# Patient Record
Sex: Female | Born: 1975
Health system: Southern US, Community
[De-identification: ages and names within clinical notes are randomized; demographics above are authoritative.]

## PROBLEM LIST (undated history)

## (undated) DIAGNOSIS — I10 Essential (primary) hypertension: Secondary | ICD-10-CM

## (undated) DIAGNOSIS — I5020 Unspecified systolic (congestive) heart failure: Secondary | ICD-10-CM

## (undated) DIAGNOSIS — B1920 Unspecified viral hepatitis C without hepatic coma: Secondary | ICD-10-CM

## (undated) HISTORY — DX: Unspecified systolic (congestive) heart failure: I50.20

## (undated) HISTORY — PX: LIVER BIOPSY: SHX301

---

## 1997-06-23 ENCOUNTER — Ambulatory Visit (HOSPITAL_COMMUNITY): Admission: RE | Admit: 1997-06-23 | Discharge: 1997-06-23 | Payer: Self-pay | Admitting: *Deleted

## 1997-08-12 ENCOUNTER — Ambulatory Visit (HOSPITAL_COMMUNITY): Admission: RE | Admit: 1997-08-12 | Discharge: 1997-08-12 | Payer: Self-pay | Admitting: *Deleted

## 1997-09-15 ENCOUNTER — Inpatient Hospital Stay (HOSPITAL_COMMUNITY): Admission: AD | Admit: 1997-09-15 | Discharge: 1997-09-15 | Payer: Self-pay | Admitting: *Deleted

## 1998-01-01 ENCOUNTER — Inpatient Hospital Stay (HOSPITAL_COMMUNITY): Admission: AD | Admit: 1998-01-01 | Discharge: 1998-01-05 | Payer: Self-pay | Admitting: *Deleted

## 1998-07-10 ENCOUNTER — Inpatient Hospital Stay (HOSPITAL_COMMUNITY): Admission: AD | Admit: 1998-07-10 | Discharge: 1998-07-10 | Payer: Self-pay | Admitting: *Deleted

## 1998-11-13 ENCOUNTER — Inpatient Hospital Stay (HOSPITAL_COMMUNITY): Admission: AD | Admit: 1998-11-13 | Discharge: 1998-11-13 | Payer: Self-pay | Admitting: *Deleted

## 1998-12-28 ENCOUNTER — Emergency Department (HOSPITAL_COMMUNITY): Admission: EM | Admit: 1998-12-28 | Discharge: 1998-12-28 | Payer: Self-pay | Admitting: Emergency Medicine

## 2000-01-05 ENCOUNTER — Emergency Department (HOSPITAL_COMMUNITY): Admission: EM | Admit: 2000-01-05 | Discharge: 2000-01-05 | Payer: Self-pay | Admitting: Emergency Medicine

## 2000-06-10 ENCOUNTER — Emergency Department (HOSPITAL_COMMUNITY): Admission: EM | Admit: 2000-06-10 | Discharge: 2000-06-10 | Payer: Self-pay | Admitting: Emergency Medicine

## 2000-08-25 ENCOUNTER — Encounter: Payer: Self-pay | Admitting: *Deleted

## 2000-08-25 ENCOUNTER — Ambulatory Visit (HOSPITAL_COMMUNITY): Admission: RE | Admit: 2000-08-25 | Discharge: 2000-08-25 | Payer: Self-pay | Admitting: *Deleted

## 2000-12-11 ENCOUNTER — Inpatient Hospital Stay (HOSPITAL_COMMUNITY): Admission: AD | Admit: 2000-12-11 | Discharge: 2000-12-11 | Payer: Self-pay | Admitting: Obstetrics and Gynecology

## 2000-12-22 ENCOUNTER — Encounter (HOSPITAL_COMMUNITY): Admission: RE | Admit: 2000-12-22 | Discharge: 2000-12-30 | Payer: Self-pay | Admitting: Obstetrics and Gynecology

## 2001-01-01 ENCOUNTER — Encounter (INDEPENDENT_AMBULATORY_CARE_PROVIDER_SITE_OTHER): Payer: Self-pay | Admitting: Specialist

## 2001-01-01 ENCOUNTER — Inpatient Hospital Stay (HOSPITAL_COMMUNITY): Admission: AD | Admit: 2001-01-01 | Discharge: 2001-01-04 | Payer: Self-pay | Admitting: Obstetrics and Gynecology

## 2001-02-26 ENCOUNTER — Other Ambulatory Visit: Admission: RE | Admit: 2001-02-26 | Discharge: 2001-02-26 | Payer: Self-pay | Admitting: Obstetrics and Gynecology

## 2001-07-10 ENCOUNTER — Emergency Department (HOSPITAL_COMMUNITY): Admission: EM | Admit: 2001-07-10 | Discharge: 2001-07-10 | Payer: Self-pay | Admitting: Emergency Medicine

## 2002-02-11 ENCOUNTER — Emergency Department (HOSPITAL_COMMUNITY): Admission: EM | Admit: 2002-02-11 | Discharge: 2002-02-11 | Payer: Self-pay

## 2002-08-05 ENCOUNTER — Other Ambulatory Visit: Admission: RE | Admit: 2002-08-05 | Discharge: 2002-08-05 | Payer: Self-pay | Admitting: Obstetrics and Gynecology

## 2002-10-24 ENCOUNTER — Inpatient Hospital Stay (HOSPITAL_COMMUNITY): Admission: AD | Admit: 2002-10-24 | Discharge: 2002-10-24 | Payer: Self-pay | Admitting: Obstetrics and Gynecology

## 2003-01-10 ENCOUNTER — Encounter: Payer: Self-pay | Admitting: Obstetrics and Gynecology

## 2003-01-10 ENCOUNTER — Inpatient Hospital Stay (HOSPITAL_COMMUNITY): Admission: AD | Admit: 2003-01-10 | Discharge: 2003-01-12 | Payer: Self-pay | Admitting: Obstetrics and Gynecology

## 2003-01-26 ENCOUNTER — Encounter: Admission: RE | Admit: 2003-01-26 | Discharge: 2003-01-26 | Payer: Self-pay | Admitting: Obstetrics and Gynecology

## 2003-02-11 ENCOUNTER — Encounter: Payer: Self-pay | Admitting: Obstetrics and Gynecology

## 2003-02-11 ENCOUNTER — Inpatient Hospital Stay (HOSPITAL_COMMUNITY): Admission: AD | Admit: 2003-02-11 | Discharge: 2003-02-13 | Payer: Self-pay | Admitting: Obstetrics and Gynecology

## 2003-02-13 ENCOUNTER — Encounter: Payer: Self-pay | Admitting: Obstetrics and Gynecology

## 2003-02-17 ENCOUNTER — Inpatient Hospital Stay (HOSPITAL_COMMUNITY): Admission: AD | Admit: 2003-02-17 | Discharge: 2003-02-22 | Payer: Self-pay | Admitting: Obstetrics and Gynecology

## 2004-06-01 ENCOUNTER — Ambulatory Visit: Payer: Self-pay | Admitting: Sports Medicine

## 2004-06-04 ENCOUNTER — Ambulatory Visit: Payer: Self-pay | Admitting: Family Medicine

## 2004-06-07 ENCOUNTER — Ambulatory Visit: Payer: Self-pay | Admitting: Family Medicine

## 2004-06-18 ENCOUNTER — Ambulatory Visit: Payer: Self-pay | Admitting: Family Medicine

## 2005-01-23 ENCOUNTER — Ambulatory Visit: Payer: Self-pay | Admitting: Family Medicine

## 2005-02-21 ENCOUNTER — Emergency Department (HOSPITAL_COMMUNITY): Admission: EM | Admit: 2005-02-21 | Discharge: 2005-02-21 | Payer: Self-pay | Admitting: Emergency Medicine

## 2005-02-22 ENCOUNTER — Ambulatory Visit: Payer: Self-pay | Admitting: Family Medicine

## 2005-02-28 ENCOUNTER — Ambulatory Visit (HOSPITAL_COMMUNITY): Admission: RE | Admit: 2005-02-28 | Discharge: 2005-02-28 | Payer: Self-pay | Admitting: Family Medicine

## 2005-02-28 ENCOUNTER — Ambulatory Visit: Payer: Self-pay | Admitting: Family Medicine

## 2005-03-14 ENCOUNTER — Ambulatory Visit: Payer: Self-pay | Admitting: Sports Medicine

## 2005-03-20 ENCOUNTER — Emergency Department (HOSPITAL_COMMUNITY): Admission: EM | Admit: 2005-03-20 | Discharge: 2005-03-20 | Payer: Self-pay | Admitting: Emergency Medicine

## 2005-03-25 ENCOUNTER — Ambulatory Visit: Payer: Self-pay | Admitting: Family Medicine

## 2005-04-09 ENCOUNTER — Ambulatory Visit: Payer: Self-pay | Admitting: Family Medicine

## 2005-05-22 ENCOUNTER — Ambulatory Visit: Payer: Self-pay | Admitting: Family Medicine

## 2005-05-24 ENCOUNTER — Ambulatory Visit: Payer: Self-pay | Admitting: Sports Medicine

## 2006-03-03 ENCOUNTER — Emergency Department (HOSPITAL_COMMUNITY): Admission: EM | Admit: 2006-03-03 | Discharge: 2006-03-03 | Payer: Self-pay | Admitting: Emergency Medicine

## 2006-06-07 ENCOUNTER — Emergency Department (HOSPITAL_COMMUNITY): Admission: EM | Admit: 2006-06-07 | Discharge: 2006-06-07 | Payer: Self-pay | Admitting: Family Medicine

## 2006-06-20 ENCOUNTER — Ambulatory Visit: Payer: Self-pay | Admitting: Family Medicine

## 2006-07-03 ENCOUNTER — Telehealth (INDEPENDENT_AMBULATORY_CARE_PROVIDER_SITE_OTHER): Payer: Self-pay | Admitting: Family Medicine

## 2006-07-22 ENCOUNTER — Telehealth (INDEPENDENT_AMBULATORY_CARE_PROVIDER_SITE_OTHER): Payer: Self-pay | Admitting: *Deleted

## 2006-09-03 ENCOUNTER — Telehealth: Payer: Self-pay | Admitting: *Deleted

## 2006-10-20 ENCOUNTER — Encounter: Payer: Self-pay | Admitting: *Deleted

## 2006-12-25 ENCOUNTER — Telehealth: Payer: Self-pay | Admitting: *Deleted

## 2007-03-31 ENCOUNTER — Telehealth: Payer: Self-pay | Admitting: *Deleted

## 2007-04-27 ENCOUNTER — Encounter (INDEPENDENT_AMBULATORY_CARE_PROVIDER_SITE_OTHER): Payer: Self-pay | Admitting: Family Medicine

## 2007-04-27 ENCOUNTER — Ambulatory Visit: Payer: Self-pay | Admitting: Family Medicine

## 2007-04-27 DIAGNOSIS — I10 Essential (primary) hypertension: Secondary | ICD-10-CM | POA: Insufficient documentation

## 2007-04-27 HISTORY — DX: Essential (primary) hypertension: I10

## 2007-04-27 LAB — CONVERTED CEMR LAB
Chloride: 105 meq/L (ref 96–112)
Potassium: 4.8 meq/L (ref 3.5–5.3)
Sodium: 141 meq/L (ref 135–145)

## 2007-06-18 ENCOUNTER — Ambulatory Visit: Payer: Self-pay | Admitting: Family Medicine

## 2007-06-18 ENCOUNTER — Telehealth: Payer: Self-pay | Admitting: *Deleted

## 2007-06-18 DIAGNOSIS — F17200 Nicotine dependence, unspecified, uncomplicated: Secondary | ICD-10-CM | POA: Insufficient documentation

## 2007-06-18 DIAGNOSIS — IMO0002 Reserved for concepts with insufficient information to code with codable children: Secondary | ICD-10-CM

## 2007-06-18 LAB — CONVERTED CEMR LAB
Cholesterol, target level: 200 mg/dL
LDL Goal: 130 mg/dL

## 2007-06-21 ENCOUNTER — Telehealth (INDEPENDENT_AMBULATORY_CARE_PROVIDER_SITE_OTHER): Payer: Self-pay | Admitting: *Deleted

## 2007-08-28 ENCOUNTER — Telehealth (INDEPENDENT_AMBULATORY_CARE_PROVIDER_SITE_OTHER): Payer: Self-pay | Admitting: Family Medicine

## 2007-08-28 ENCOUNTER — Emergency Department (HOSPITAL_COMMUNITY): Admission: EM | Admit: 2007-08-28 | Discharge: 2007-08-28 | Payer: Self-pay | Admitting: Family Medicine

## 2007-08-31 ENCOUNTER — Telehealth: Payer: Self-pay | Admitting: *Deleted

## 2007-09-01 ENCOUNTER — Encounter (INDEPENDENT_AMBULATORY_CARE_PROVIDER_SITE_OTHER): Payer: Self-pay | Admitting: Family Medicine

## 2007-09-01 ENCOUNTER — Ambulatory Visit: Payer: Self-pay | Admitting: Family Medicine

## 2007-09-01 LAB — CONVERTED CEMR LAB
ALT: 28 units/L (ref 0–35)
AST: 28 units/L (ref 0–37)
Albumin: 4.3 g/dL (ref 3.5–5.2)
Alkaline Phosphatase: 59 units/L (ref 39–117)
Calcium: 8.8 mg/dL (ref 8.4–10.5)
Chloride: 103 meq/L (ref 96–112)
LDL Cholesterol: 72 mg/dL (ref 0–99)
Potassium: 4.3 meq/L (ref 3.5–5.3)
Sodium: 136 meq/L (ref 135–145)
Total Protein: 7.2 g/dL (ref 6.0–8.3)

## 2007-09-10 ENCOUNTER — Ambulatory Visit: Payer: Self-pay | Admitting: Family Medicine

## 2007-09-30 ENCOUNTER — Encounter (INDEPENDENT_AMBULATORY_CARE_PROVIDER_SITE_OTHER): Payer: Self-pay | Admitting: *Deleted

## 2007-10-19 ENCOUNTER — Other Ambulatory Visit: Admission: RE | Admit: 2007-10-19 | Discharge: 2007-10-19 | Payer: Self-pay | Admitting: Family Medicine

## 2007-10-19 ENCOUNTER — Encounter (INDEPENDENT_AMBULATORY_CARE_PROVIDER_SITE_OTHER): Payer: Self-pay | Admitting: Family Medicine

## 2007-10-19 ENCOUNTER — Ambulatory Visit: Payer: Self-pay | Admitting: Family Medicine

## 2007-10-19 DIAGNOSIS — D239 Other benign neoplasm of skin, unspecified: Secondary | ICD-10-CM | POA: Insufficient documentation

## 2007-10-19 LAB — CONVERTED CEMR LAB
Pap Smear: NORMAL
Whiff Test: NEGATIVE

## 2007-10-21 ENCOUNTER — Encounter (INDEPENDENT_AMBULATORY_CARE_PROVIDER_SITE_OTHER): Payer: Self-pay | Admitting: Family Medicine

## 2007-10-21 LAB — CONVERTED CEMR LAB
Chlamydia, DNA Probe: NEGATIVE
GC Probe Amp, Genital: NEGATIVE
Hepatitis B Surface Ag: NEGATIVE

## 2007-10-28 ENCOUNTER — Telehealth (INDEPENDENT_AMBULATORY_CARE_PROVIDER_SITE_OTHER): Payer: Self-pay | Admitting: Family Medicine

## 2007-11-02 ENCOUNTER — Encounter (INDEPENDENT_AMBULATORY_CARE_PROVIDER_SITE_OTHER): Payer: Self-pay | Admitting: Family Medicine

## 2007-11-02 ENCOUNTER — Ambulatory Visit: Payer: Self-pay | Admitting: Family Medicine

## 2007-11-02 DIAGNOSIS — G4700 Insomnia, unspecified: Secondary | ICD-10-CM

## 2007-11-02 DIAGNOSIS — B1920 Unspecified viral hepatitis C without hepatic coma: Secondary | ICD-10-CM

## 2007-11-02 DIAGNOSIS — Z8619 Personal history of other infectious and parasitic diseases: Secondary | ICD-10-CM | POA: Insufficient documentation

## 2007-11-08 ENCOUNTER — Encounter (INDEPENDENT_AMBULATORY_CARE_PROVIDER_SITE_OTHER): Payer: Self-pay | Admitting: Family Medicine

## 2007-11-10 ENCOUNTER — Encounter (INDEPENDENT_AMBULATORY_CARE_PROVIDER_SITE_OTHER): Payer: Self-pay | Admitting: Family Medicine

## 2007-11-10 ENCOUNTER — Telehealth (INDEPENDENT_AMBULATORY_CARE_PROVIDER_SITE_OTHER): Payer: Self-pay | Admitting: Family Medicine

## 2007-11-10 LAB — CONVERTED CEMR LAB: HCV Quantitative: 11700000 intl units/mL — ABNORMAL HIGH (ref <43)

## 2007-12-01 ENCOUNTER — Encounter: Payer: Self-pay | Admitting: *Deleted

## 2007-12-31 ENCOUNTER — Encounter (INDEPENDENT_AMBULATORY_CARE_PROVIDER_SITE_OTHER): Payer: Self-pay | Admitting: Family Medicine

## 2008-01-08 ENCOUNTER — Encounter: Payer: Self-pay | Admitting: *Deleted

## 2008-01-19 ENCOUNTER — Telehealth: Payer: Self-pay | Admitting: *Deleted

## 2008-04-27 ENCOUNTER — Telehealth (INDEPENDENT_AMBULATORY_CARE_PROVIDER_SITE_OTHER): Payer: Self-pay | Admitting: *Deleted

## 2008-05-06 ENCOUNTER — Encounter: Payer: Self-pay | Admitting: *Deleted

## 2008-05-06 ENCOUNTER — Encounter (INDEPENDENT_AMBULATORY_CARE_PROVIDER_SITE_OTHER): Payer: Self-pay | Admitting: Family Medicine

## 2008-05-06 ENCOUNTER — Ambulatory Visit: Payer: Self-pay | Admitting: Family Medicine

## 2008-05-06 DIAGNOSIS — M25569 Pain in unspecified knee: Secondary | ICD-10-CM | POA: Insufficient documentation

## 2008-05-06 LAB — CONVERTED CEMR LAB
BUN: 12 mg/dL (ref 6–23)
CO2: 23 meq/L (ref 19–32)
Chloride: 102 meq/L (ref 96–112)
Creatinine, Ser: 0.83 mg/dL (ref 0.40–1.20)
Glucose, Bld: 76 mg/dL (ref 70–99)

## 2008-05-09 ENCOUNTER — Ambulatory Visit (HOSPITAL_COMMUNITY): Admission: RE | Admit: 2008-05-09 | Discharge: 2008-05-09 | Payer: Self-pay | Admitting: Family Medicine

## 2008-05-19 ENCOUNTER — Encounter (INDEPENDENT_AMBULATORY_CARE_PROVIDER_SITE_OTHER): Payer: Self-pay | Admitting: Family Medicine

## 2008-05-19 ENCOUNTER — Ambulatory Visit: Payer: Self-pay | Admitting: Gastroenterology

## 2008-05-25 ENCOUNTER — Telehealth (INDEPENDENT_AMBULATORY_CARE_PROVIDER_SITE_OTHER): Payer: Self-pay | Admitting: *Deleted

## 2008-05-31 ENCOUNTER — Encounter (INDEPENDENT_AMBULATORY_CARE_PROVIDER_SITE_OTHER): Payer: Self-pay | Admitting: Family Medicine

## 2008-05-31 ENCOUNTER — Ambulatory Visit (HOSPITAL_COMMUNITY): Admission: RE | Admit: 2008-05-31 | Discharge: 2008-05-31 | Payer: Self-pay | Admitting: Gastroenterology

## 2008-06-01 ENCOUNTER — Telehealth: Payer: Self-pay | Admitting: *Deleted

## 2008-06-10 ENCOUNTER — Ambulatory Visit (HOSPITAL_COMMUNITY): Admission: RE | Admit: 2008-06-10 | Discharge: 2008-06-10 | Payer: Self-pay | Admitting: Gastroenterology

## 2008-06-10 ENCOUNTER — Encounter (INDEPENDENT_AMBULATORY_CARE_PROVIDER_SITE_OTHER): Payer: Self-pay | Admitting: Interventional Radiology

## 2008-06-14 ENCOUNTER — Telehealth (INDEPENDENT_AMBULATORY_CARE_PROVIDER_SITE_OTHER): Payer: Self-pay | Admitting: Family Medicine

## 2008-06-15 ENCOUNTER — Encounter (INDEPENDENT_AMBULATORY_CARE_PROVIDER_SITE_OTHER): Payer: Self-pay | Admitting: Family Medicine

## 2008-06-15 ENCOUNTER — Telehealth: Payer: Self-pay | Admitting: *Deleted

## 2008-06-17 ENCOUNTER — Encounter (INDEPENDENT_AMBULATORY_CARE_PROVIDER_SITE_OTHER): Payer: Self-pay | Admitting: Family Medicine

## 2008-06-28 ENCOUNTER — Ambulatory Visit: Payer: Self-pay | Admitting: Gastroenterology

## 2008-08-08 ENCOUNTER — Telehealth (INDEPENDENT_AMBULATORY_CARE_PROVIDER_SITE_OTHER): Payer: Self-pay | Admitting: *Deleted

## 2008-10-21 ENCOUNTER — Encounter: Payer: Self-pay | Admitting: *Deleted

## 2008-11-21 ENCOUNTER — Telehealth: Payer: Self-pay | Admitting: *Deleted

## 2008-11-30 ENCOUNTER — Ambulatory Visit: Payer: Self-pay | Admitting: Family Medicine

## 2008-11-30 ENCOUNTER — Encounter: Payer: Self-pay | Admitting: Family Medicine

## 2008-11-30 LAB — CONVERTED CEMR LAB
AST: 25 units/L (ref 0–37)
Albumin: 4 g/dL (ref 3.5–5.2)
Alkaline Phosphatase: 66 units/L (ref 39–117)
BUN: 8 mg/dL (ref 6–23)
Hemoglobin: 11.9 g/dL — ABNORMAL LOW (ref 12.0–15.0)
MCHC: 33.8 g/dL (ref 30.0–36.0)
Platelets: 254 10*3/uL (ref 150–400)
Potassium: 4.3 meq/L (ref 3.5–5.3)
RDW: 13.8 % (ref 11.5–15.5)

## 2008-12-01 ENCOUNTER — Telehealth: Payer: Self-pay | Admitting: *Deleted

## 2008-12-01 ENCOUNTER — Encounter: Payer: Self-pay | Admitting: Family Medicine

## 2008-12-15 ENCOUNTER — Telehealth (INDEPENDENT_AMBULATORY_CARE_PROVIDER_SITE_OTHER): Payer: Self-pay | Admitting: *Deleted

## 2008-12-19 ENCOUNTER — Encounter (INDEPENDENT_AMBULATORY_CARE_PROVIDER_SITE_OTHER): Payer: Self-pay | Admitting: *Deleted

## 2009-10-20 ENCOUNTER — Ambulatory Visit: Payer: Self-pay | Admitting: Family Medicine

## 2009-10-20 ENCOUNTER — Encounter: Payer: Self-pay | Admitting: Family Medicine

## 2009-10-20 LAB — CONVERTED CEMR LAB
AST: 23 units/L (ref 0–37)
Alkaline Phosphatase: 49 units/L (ref 39–117)
BUN: 8 mg/dL (ref 6–23)
Creatinine, Ser: 0.7 mg/dL (ref 0.40–1.20)
HCT: 37.3 % (ref 36.0–46.0)
HDL: 64 mg/dL (ref >39)
Hemoglobin: 12.4 g/dL (ref 12.0–15.0)
LDL Cholesterol: 71 mg/dL (ref 0–99)
MCHC: 33.2 g/dL (ref 30.0–36.0)
RDW: 14 % (ref 11.5–15.5)
Total CHOL/HDL Ratio: 2.3

## 2009-10-25 ENCOUNTER — Encounter: Payer: Self-pay | Admitting: Family Medicine

## 2010-05-31 NOTE — Assessment & Plan Note (Signed)
Summary: f/u HTN, Hep C, Tobacco,tcb   Vital Signs:  Patient profile:   35 year old female Weight:      176.9 pounds BMI:     31.45 Temp:     97.9 degrees F oral Pulse rate:   90 / minute Pulse rhythm:   regular BP sitting:   190 / 136  (left arm) Cuff size:   regular  Vitals Entered By: Loralee Pacas CMA (October 20, 2009 8:53 AM)  Serial Vital Signs/Assessments:  Time      Position  BP       Pulse  Resp  Temp     By                     176/120                        Delbert Harness MD    Primary Care Provider:  Delbert Harness MD   History of Present Illness: 35 yo here for follow-up of HTN, hep C, Tobacco cessation  HYPERTENSION Meds: Taking and tolerating?  Has not been seen in some time.  History of difficult to control Hypertension on 3-4 agents.  Has not taken meds in months. Home BP's: no Chest Pain: no Dyspnea: no vision changes: no headache: intermittantly  Hep C:  no interim visit with hepatologist.  Never came back for Hep A vaccination.  No jaundice, abd pain.  tobacco abuse:  continues to smoke although she feels like fewer cigarettes per day.  requests to restart wellbutrin as she feels it was helpful last time.     Habits & Providers  Alcohol-Tobacco-Diet     Tobacco Status: current     Tobacco Counseling: to quit use of tobacco products     Cigarette Packs/Day: <0.25  Allergies: No Known Drug Allergies  Past History:  Past Medical History: 6 children Regular menses Poorly controlled HTN - adherence an issue Hepatitis C Allergic Rhinitis  Social History: Packs/Day:  <0.25  Review of Systems      See HPI       Neg for 10 systems as per HPI.  Physical Exam  General:  Overweight-appearing, no acute distress.  Alert and oriented x 3.  Eyes:  no scleral icterus Lungs:  Normal respiratory effort, chest expands symmetrically. Lungs are clear to auscultation, no crackles or wheezes. Heart:  Normal rate and regular rhythm. S1 and S2 normal  without gallop, murmur, click, rub or other extra sounds. Abdomen:  Normoactive bowel sounds.  Soft, non-tender, non-distended.  No HSM.  Extremities:  no LE edema   Impression & Recommendations:  Problem # 1:  HYPERTENSION (ICD-401.9)  Stressed importance of good control of HTN in long term health.  Will restart meds one at a time starting with metoprolol since she also has migraines.  Asked patient to get BP monitor.  I suspect she will need multiple meds to get BP under control given history and BP today.  patient to add back Lis/HCTZ several days later.  Will return and at that time will decide whether or not to add back norvasc.  Her updated medication list for this problem includes:    Norvasc 10 Mg Tabs (Amlodipine besylate) ..... Once daily    Lisinopril-hydrochlorothiazide 20-12.5 Mg Tabs (Lisinopril-hydrochlorothiazide) .Marland Kitchen... 2 tablets by mouth daily    Toprol Xl 25 Mg Xr24h-tab (Metoprolol succinate) .Marland Kitchen... 1 tablet by mouth at bedtime for blood pressure  BP today: 190/136 Prior  BP: 146/105 (11/30/2008)  Prior 10 Yr Risk Heart Disease: 1 % (09/10/2007)  Labs Reviewed: K+: 4.3 (11/30/2008) Creat: : 0.69 (11/30/2008)   Chol: 157 (09/01/2007)   HDL: 61 (09/01/2007)   LDL: 72 (09/01/2007)   TG: 118 (09/01/2007)  Orders: Comp Met-[facility] ([id number]) Lipid-[facility] (13086-57846) CBC-[facility] (96295) [facility]- Est  Level 4 (28413)  Problem # 2:  HEPATITIS C (ICD-070.51) Gave #1 of hepatitis A series.  will need #2 in 6 months.  gave patient handout on recommended vaccinations for patients with hepatitis C.  Discussed personal responsibility to take care of her health.  She will return at n ext visit and discuss if she needs other vaccines as well.    CHeck CMP today.  Orders: Comp Met-[facility] (24401-02725) CBC-[facility] (36644) [facility]- Est  Level 4 (03474)  Problem # 3:  TOBACCO ABUSE (ICD-305.1)  Refilled wellbutrin.  Discussed [place] quit line and classess offered at [facility].  Patient literature  given  Orders: [facility]- Est  Level 4 (25956)  Complete Medication List: 1)  Wellbutrin Xl 300 Mg Xr24h-tab (Bupropion hcl) .Marland Kitchen.. 1 tablet by mouth every morning (start with 150 mg tabs first 3 days) 2)  Norvasc 10 Mg Tabs (Amlodipine besylate) .... Once daily 3)  Prenatal Vitamins 0.8 Mg Tabs (Prenatal multivit-min-fe-fa) .Marland Kitchen.. 1 tablet by mouth daily 4)  Lisinopril-hydrochlorothiazide 20-12.5 Mg Tabs (Lisinopril-hydrochlorothiazide) .... 2 tablets by mouth daily 5)  Toprol Xl 25 Mg Xr24h-tab (Metoprolol succinate) .Marland Kitchen.. 1 tablet by mouth at bedtime for blood pressure 6)  Wellbutrin Xl 150 Mg Xr24h-tab (Bupropion hcl) .... Take one tablet each morning for 3 days, then switch to 300 mg tabs  Other Orders: Hepatitis A Vaccine (Adult Dose) (38756) Admin 1st Vaccine (43329)  Patient Instructions: 1)  Will restart your blood pressure pills.  Start with Metoprolol.  Wait 2-3 days If your blood pressure is above 140/90 add lisnopril/hctz.   2)  Make follow-up appt in 2 weeks for blood pressure recheck- We will decide on your third blood pressure medicine then (norvasc) 3)  Keep blood pressure log. 4)  You also need an appointment for your annual gynecological exam. 5)  Return for second hepatitis A vaccine in 6 months. Prescriptions: WELLBUTRIN XL 300 MG XR24H-TAB (BUPROPION HCL) 1 tablet by mouth every morning (start with 150 mg tabs first 3 days)  #30 x 2   Entered and Authorized by:   Delbert Harness MD   Signed by:   Delbert Harness MD on 10/20/2009   Method used:   Electronically to        [facility] [phone][facility] (retail)       [address] [place]  [address]       Ph: [phone] or [phone]       Fax: [phone]   RxID:   [id number] WELLBUTRIN XL 150 MG XR24H-TAB (BUPROPION HCL) Take one tablet each morning for 3 days, then switch to 300 mg tabs  #3 x 0   Entered and Authorized by:   Delbert Harness MD   Signed by:   Delbert Harness MD on 10/20/2009   Method used:    [facility]ronically to        [facility] [phone]* (retail)       [address]       [place], [place]  [address]       Ph: [phone] or [phone]       Fax: [phone]  RxID:   [id number] TOPROL XL 25 MG XR24H-TAB (METOPROLOL SUCCINATE) 1 tablet by mouth at bedtime for blood pressure  #30 x 2   Entered and Authorized by:   Delbert Harness MD   Signed by:   Delbert Harness MD on 10/20/2009   Method used:   [facility] to        [facility] [phone]* (retail)       [address]       [place], [place]  [address]       Ph: [phone] or [phone]       Fax: [phone]   RxID:   [id number] LISINOPRIL-HYDROCHLOROTHIAZIDE 20-12.5 MG TABS (LISINOPRIL-HYDROCHLOROTHIAZIDE) 2 tablets by mouth daily  #60 x 2   Entered and Authorized by:   Delbert Harness MD   Signed by:   Delbert Harness MD on 10/20/2009   Method used:   [facility] to        [facility] [phone]* (retail)       [address] [place]  [address]       Ph: [phone] or [phone]       Fax: [phone]   RxID:   [id number]    Prevention & Chronic Care Immunizations   Influenza vaccine: Not documented    Tetanus booster: 05/30/2004: Done.   Tetanus booster due: 05/30/2014    Pneumococcal vaccine: Not documented  Other Screening   Pap smear: normal  (10/19/2007)   Pap smear due: 10/18/2008   Smoking status: current  (10/20/2009)   Smoking cessation counseling: yes  (11/02/2007)   Target quit date: 09/16/2007  (09/10/2007)  Hypertension   Last Blood Pressure: 190 / 136  (10/20/2009)   Serum creatinine: 0.69  (11/30/2008)   Serum potassium 4.3  (11/30/2008) CMP ordered     Hypertension flowsheet reviewed?: Yes   Progress toward BP goal: Deteriorated  Self-Management Support :    Patient will work on the following items until the next clinic visit to reach self-care goals:     Medications and monitoring: take my medicines every day  (10/20/2009)     Eating: eat more  vegetables, eat foods that are low in salt  (10/20/2009)    Hypertension self-management support: BP self-monitoring log, Written self-care plan, Education handout  (10/20/2009)   Hypertension self-care plan printed.   Hypertension education handout printed   Immunizations Administered:  Hepatitis A Vaccine # 1:    Vaccine Type: HepA    Site: right deltoid    Mfr: GlaxoSmithKline    Dose: 1.0 ml    Route: IM    Given by: Loralee Pacas CMA    Exp. Date: 01/13/2011    Lot #: [id number]    VIS given: 07/17/04 version given October 20, 2009.

## 2010-05-31 NOTE — Letter (Signed)
Summary: Results Follow-up Letter  [facility]  [address]   [place], [place] [address]   Phone: [phone]  Fax: [phone]    10/25/2009  [address] [place], [place]  [address]  Dear Ms. Ladona Ridgel,   The following are the results of your recent test(s):  Your bloodwork which included liver enzymes, kidney function, and cholesterol were all normal.  I look forward to seeing you soon to follow-up on your blood pressure!  Sincerely,  Delbert Harness MD [facility]           Appended Document: Results Follow-up Letter mailed

## 2010-08-14 LAB — CBC
Platelets: 260 10*3/uL (ref 150–400)
RDW: 13.2 % (ref 11.5–15.5)

## 2010-08-14 LAB — PROTIME-INR: INR: 0.9 (ref 0.00–1.49)

## 2010-09-14 NOTE — H&P (Signed)
[facility]  Patient:    [name], [name] Visit Number: [id number] MRN: [id number]          Service Type: OBS Location: [id number] Attending Physician:  Wandalee Ferdinand Admit Date:  01/01/2001                           History and Physical  HISTORY OF PRESENT ILLNESS:   This is a 35 year old gravida 5, para 4, estimated date of confinement January 03, 2001, 39 weeks, 5 days gestation. 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  The patient presented to me at 36 weeks, 5 days gestation on Aug 19, 2024after the death of her previous physician.  She had a history of chronic hypertension.  She had an ultrasound at or about [redacted] weeks gestation which was consistent with her dates.  She did not get a diabetes screen or AFP to my knowledge.  She did have a group B strep culture done 08/19/24which was negative.  After her first visit, she had a comprehensive metabolic panel which was normal except for a potassium of 3.3.  Patient returned December 22, 2000, at which time blood pressure was noted to be elevated with values of 143/109 and 141/101, later 129/85.  She denied any severe preeclampsia symptoms.  Pregnancy-induced hypertension panel was obtained and was negative.  The impression at that time was chronic hypertension for which the patient was asked to take Aldomet 500 mg b.i.d.  Unfortunately, she was noncompliant with that request.  She returned on August 29 stating that she was unable to keep her August 28 appointment.  At that time, she stated that her "Medicaid didnt come through" and, therefore, she had not taken the prescribed Aldomet.  She was seen in the office September 3 at which time her blood pressure was noted to be 130/80. She states she still had not taken the Aldomet as had  been requested.  She stated, without fail, she would get it on that day and begin taking it on that day.  She was asked to return two days later which is today.  When she presented to the office today, she stated she still had not taken the Aldomet as prescribed.  In addition, she stated she took some old hydrochlorothiazide she had in her possession without authorization from this office.  She denied any visual disturbances, epigastric pain, right upper quadrant pain.  She states her baby is moving well.  MEDICATIONS:                  Vitamins.  PAST MEDICAL HISTORY:         See above.  Patient was diagnosed as having chronic hypertension in 1999.  Her sickle status is unknown.  The father of the baby, however, is positive for the trait.  FAMILY HISTORY:               Noncontributory.  SOCIAL HISTORY:               Noncontributory.  REVIEW OF SYSTEMS:            Noncontributory.  PHYSICAL EXAMINATION:  GENERAL:                      A well-developed, well-nourished, pleasant black female in no  acute distress, afebrile.  VITAL SIGNS:                  Stable.  Blood pressure today 130/80.  SKIN:                         Warm and dry without lesions.  LYMPH:                        There is no supraclavicular, cervical, or inguinal adenopathy.  HEENT:                        Normocephalic.  NECK:                         Supple without thyromegaly.  CHEST/LUNGS:                  Clear.  CARDIAC:                      Regular rate and rhythm without murmurs, gallops, or rubs.  BREASTS:                      Exam deferred.  ABDOMEN:                      Gravid with a term fundal height.  Fetal heart tones were auscultated with the Doppler.  MUSCULOSKELETAL:              Examination reveals full range of motion without edema, cyanosis, or CVA tenderness.  Deep tendon reflexes are 2+ and equal bilaterally.  PELVIC:                       Cervix is loose fingertip dilatation,  60% effaced, -2 station, vertex presentation.  LABORATORY DATA:              Pending.  IMPRESSION:                   1. Intrauterine pregnancy at 39 weeks, 5 days                                  gestation.                               2. History of chronic hypertension with possible                                  superimposed preeclampsia.                               3. Advanced cervical changes at term.                               4. Noncompliance with OB-GYN care.  PLAN:                         Feel with the level of noncompliance demonstrated by this patient and with her known history of hypertension  with uncertainty surrounding the possibility of superimposed preeclampsia, best course of action is to admit immediately to labor and delivery with plans for immediate induction.  Will obtain labs prior to initiating.  Watch blood pressures closely.  The patient states she understands and accepts and is comfortable with this plan. Attending Physician:  Wandalee Ferdinand DD:  01/01/01 TD:  01/01/01 Job: [id number]

## 2010-09-14 NOTE — H&P (Signed)
NAME:  [name], [name]                          ACCOUNT NO.:  [id number]   MEDICAL RECORD NO.:  [id number]                   PATIENT TYPE:  INP   LOCATION:  9159                                 FACILITY:  [facility]   PHYSICIAN:  Rudy Jew. Ashley Royalty, M.D.             DATE OF BIRTH:  31-Dec-1975   DATE OF ADMISSION:  02/11/2003  DATE OF DISCHARGE:                                HISTORY & PHYSICAL   HISTORY OF PRESENT ILLNESS:  This is a 35 year old, gravida 6, para 5, EDC  February 28, 2003, currently 37 weeks 5 days gestation.  Prenatal care has  been complicated by hypertension currently on 500 mg of Aldomet 500 mg  t.i.d., grand multiparity, Trichomonas infection (treated), and chlamydia  (treated).  Pregnancy also complicated by alcohol use (possible abuse),  smoking, and noncompliance.  The patient has been receiving nonstress tests  at [facility] due to her history of hypertension, as well as tobacco  use.  My office received a call from Diane Day yesterday, stating that  [name] has missed three nonstress tests appointments.  In addition, she  missed her January 31, 2003, and February 09, 2003, appointments at my office.  Hence, she has not been seen in my office since January 26, 2003, until  today.  At the time of that visit, her blood pressure was 140/80.  The  patient presented to the office today and the initial sitting blood pressure  per my nurse was 158/94.  I questioned her regarding severe PIH symptoms.  She denied any visual disturbances, scotomata, headaches, and epigastric or  right upper quadrant pain.  She is admitted for evaluation of blood  pressures and fetal surveillance and to evaluate for the possibility of  delivery.   MEDICATIONS:  Aldomet 500 mg p.o. t.i.d.   PAST MEDICAL HISTORY:  See above.  The patient also states that she has  genital herpes.   PAST SURGICAL HISTORY:  None.   ALLERGIES:  None.   FAMILY HISTORY:  Noncontributory.   SOCIAL HISTORY:   The patient uses both alcohol and tobacco.   REVIEW OF SYSTEMS:  Noncontributory.   PHYSICAL EXAMINATION:  GENERAL APPEARANCE:  A well-developed, well-  nourished, pleasant, black female in no acute distress.  VITAL SIGNS:  The blood pressure in the right arm by me in the supine  position was 142/100.  On the left side, it dropped to approximately 130/86.  SKIN:  Warm and dry without lesions.  LYMPHATICS:  There is no supraclavicular, cervical, or inguinal adenopathy.  HEENT:  Normocephalic.  NECK:  Supple without thyromegaly.  CHEST:  Lungs were clear.  CARDIAC:  Regular rate and rhythm without murmurs, rubs, or gallops.  BREASTS:  Exam deferred.  ABDOMEN:  Gravid with a fundal height of approximately 36 cm.  Fetal heart  tones were auscultated with a Doppler.  MUSCULOSKELETAL:  Full range of motion without cyanosis or clubbing.  There  is perhaps trace dependent edema.  Deep tendon reflexes were 2+ and equal.  PELVIC:  Sterile vaginal examination revealed the cervix to be fingertip  dilatation, 40% effaced, -3 station, and vertex presentation.   IMPRESSION:  1. Intrauterine pregnancy at 37 weeks 5 days gestation.  2. Chronic hypertension.  3. Grand multiparity.  4. History of Trichomonas and chlamydial infections, both treated.  5. History of alcohol and tobacco use during this pregnancy.  6. Genital herpes with no recent lesions.  7. History of noncompliance with OB/GYN care.   PLAN:  1. Admit to [facility].  2. Check laboratories.  3. Arrange ultrasound.  4. Arrange fetal surveillance.  5. Evaluate the need for delivery.                                               James A. Ashley Royalty, M.D.    JAM/MEDQ  D:  02/11/2003  T:  02/11/2003  Job:  [id number]

## 2010-09-14 NOTE — Discharge Summary (Signed)
[facility]  Patient:    [name], [place] Visit Number: [id number] MRN: [id number]          Service Type: Attending:  Rudy Jew. Ashley Royalty, M.D. Dictated by:   Rudy Jew Ashley Royalty, M.D. Adm. Date:  01/01/01 Disc. Date: 01/04/01                             Discharge Summary  DISCHARGE DIAGNOSES:          1. Intrauterine pregnancy at 39 weeks 5 days                                  gestation.                               2. Chronic hypertension.                               3. Superimposed preeclampsia, severe.                               4. Noncompliance with obstetrical and                                  gynecological care.                               5. Obstetrical delivery with no episiotomy.  CONSULTATIONS:                None.  DISCHARGE MEDICATIONS:        Aldomet 500 mg b.i.d.  HISTORY OF PRESENT ILLNESS:   This is a 35 year old, gravida 5, para 4 with an EDC of January 03, 2001, at 39+ weeks gestation.  Prenatal care was complicated by late presentation, genital HSV, hypertension, smoking, alcohol use, and sickle trait in father of baby.  The patient presented the first time at 36 weeks 5 days, having received prenatal care elsewhere.  She was noncompliance with her visits and was noted to hypertensive and only compliant with Aldomet prescription marginally.  She also took hydrochlorothiazide without authorization from my office.  She was noted on or about January 01, 2001, to have possible superimposed preeclampsia, as well as advanced cervical changes.  Hence, she was brought into the hospital for delivery.  HOSPITAL COURSE:              The patient was admitted to the [facility].  Admission laboratory studies were drawn.  The initial cervical examination was 2 cm dilated, 50% effaced, -2 station, and vertex presentation.  Artificial rupture of membranes revealed light meconium. Coagulation studies were obtained  and the SGOT was 53 and SGPT 49, both elevated.  Other coagulation studies were normal, except for the fibrin split products, which were 0.62 (elevated).  The patient requested and received an epidural.  She went on to deliver on January 01, 2001, a 6 pound 12 ounce female with Apgars of 9 at one minute and 9 at five minutes at the newborn nursery.  Delivery was accomplished by Fayrene Fearing A. Ashley Royalty, M.D., over an intact  perineum.  After admission, magnesium sulfate was initiated and continued after delivery.  On January 03, 2001, the SGOT was 42 and SGPT down to 33. On January 04, 2001, the superimposed preeclampsia was felt to have essentially resolved.  DISPOSITION:                  The patient was hence discharged home on Aldomet 500 mg p.o. b.i.d.  She was given excellent discharge instructions and asked to return specifically on January 06, 2001, for follow-up exam.  She stated that she understood and will comply with that request.  ACCESSORY CLINICAL FINDINGS:  The hemoglobin and hematocrit on admission were 12.0 and 35.7, respectively.  Please see the chart for the remainder of the details of all of the laboratory work.  Most has been enumerated above.  FOLLOW-UP:                    The patient is to return in four to six weeks for postpartum evaluation. Dictated by:   Rudy Jew Ashley Royalty, M.D. Attending:  Rudy Jew Ashley Royalty, M.D. DD:  02/21/01 TD:  02/23/01 Job: [id number]

## 2010-09-14 NOTE — Discharge Summary (Signed)
NAME:  [name], [name]                          ACCOUNT NO.:  [id number]   MEDICAL RECORD NO.:  [id number]                   PATIENT TYPE:  INP   LOCATION:  9103                                 FACILITY:  [facility]   PHYSICIAN:  Rudy Jew. Ashley Royalty, M.D.             DATE OF BIRTH:  November 19, 1975   DATE OF ADMISSION:  02/17/2003  DATE OF DISCHARGE:  02/22/2003                                 DISCHARGE SUMMARY   DISCHARGE DIAGNOSES:  1. Intrauterine pregnancy at term, delivered.  2. Chronic hypertension.  3. History of alcohol use (question abuse) during pregnancy.  4. Smoking.  5. Noncompliance with obstetrical care.  6. Anemia.   DISCHARGE MEDICATIONS:  1. Chromagen Forte one daily.  2. Tylenol.   HISTORY AND PHYSICAL:  This is a 35 year old gravida 6 para 5 at 61 and one-  half weeks gestation.  Risk factors include grand multiparous status,  chronic hypertension for which the patient currently takes Aldomet 5 mg p.o.  t.i.d., history of alcohol use (question abuse), smoking, and noncompliance.  The patient was admitted for induction secondary to chronic hypertension,  smoking, and noncompliance (the patient missed several appointments for  nonstress tests).  For the remainder of the past medical history, please see  chart.   HOSPITAL COURSE:  The patient was admitted to [facility]  [place].  Admission laboratory studies were drawn.  On February 17, 2003  she delivered a 6-pound 1-ounce female, Apgars 9 at one minute and 9 at five  minutes, sent to newborn nursery.  Delivery was uncomplicated.  Estimated  blood loss was 300 mL.  There was no episiotomy or laceration.  Afterwards  the patient expressed interest in a tubal sterilization procedure.  After  full discussion we agreed to schedule the procedure for six weeks  postpartum.  On October 22 when I rounded on the patient a nurse reported  that the patient had some postpartum bleeding last night which responded to  Pitocin.  There was no call to doctor provided in that setting.  The  hemoglobin at that time was 7.3.  The decision was made to continue to watch  the patient and obtain serial hemoglobins.  On October 23 the hemoglobin was  noted to be 5.9.  The patient was minimally symptomatic with standing but  not bad.  On October 24 the hemoglobin was noted to be 6.2.  The patient  continued to be minimally symptomatic.  In addition, her mother came out to  me and asked if I should have been called when she had her bleeding episode  on October 21.  She also stated that some of the patient's blood pressure  medications had been missed.  For this reason I contacted risk management  so that they could survey this case as well.  The patient was followed until  February 21, 2003.  Minimal symptoms continued with respect to dizziness.  Hemoglobin  was 5.6 on that morning which continued to be slightly lower than  the one before.  Discussed with the patient the risks and benefits of  transfusion versus continued expectant management.  Though they wanted to go  home they also did not want a transfusion and the decision was made to keep  her an additional.  On October 26 the patient reported no dizziness  whatsoever.  Hemoglobin was up to 5.7 which was a slight increase compared  to the day before.  At this point the patient was felt to be stable for  discharge as long as she agreed to come to the office on the subsequent  calendar day for a CBC and went home on the aforementioned medications.   ACCESSORY CLINICAL FINDINGS:  The laboratory studies at the time of this  dictation do not appear to be present on the chart.   DISPOSITION:  As mentioned above, the patient is to return to [facility] on the next calendar day for reassessment and CBC  determination.  She is also to return in a period of approximately four  weeks for a postpartum visit assuming all goes well.                                                James A. Ashley Royalty, M.D.    JAM/MEDQ  D:  03/07/2003  T:  03/07/2003  Job:  [id number]

## 2010-09-14 NOTE — H&P (Signed)
NAME:  [name], [name]                          ACCOUNT NO.:  [id number]   MEDICAL RECORD NO.:  [id number]                   PATIENT TYPE:  INP   LOCATION:  9159                                 [facility]   PHYSICIAN:  Rudy Jew. Ashley Royalty, M.D.             DATE OF BIRTH:  09-Mar-1976   DATE OF ADMISSION:  01/10/2003  DATE OF DISCHARGE:                                HISTORY & PHYSICAL   HISTORY OF PRESENT ILLNESS:  This is a 35 year old gravida 6, para 5, EDC of  February 28, 2003, currently [redacted] weeks gestation.  Prenatal care has been  complicated by chronic hypertension for which the patient is currently on  Aldomet 250 mg b.i.d., Chlamydia (treated August 09, 2002), grand  multiparity, Trichomoniasis (treated), and alcohol use.  The patient is also  a smoker.  She was in the office on January 05, 2003, for a routine OB  visit and the blood pressure was noted to be 142/90. She denied any symptoms  consistent with severe preeclampsia.  PIH panel was obtained and was normal  except for a hemoglobin of 9.8.  The patient was initiated on Chromagen and  asked to return in several days for reassessment.  She returned today for  reassessment and again denied any visual disturbances, right upper  quadrant/epigastric pain, headaches, or other symptoms consistent with  severe preeclampsia.  Blood pressure by me in the sitting position, right  arm, was 155/110.  On her left side, it was 154/100.  The patient is hence  admitted for further evaluation and therapy due to hypertensive  complications of pregnancy.   PAST MEDICAL HISTORY:  Chronic hypertension as above.   PAST SURGICAL HISTORY:  Negative.   ALLERGIES:  No known drug allergies.   SOCIAL HISTORY:  The patient is a smoker. She has also been using alcohol  without question during this pregnancy and has been noted to be under the  influence of alcohol on occasion at office visits.   REVIEW OF SYSTEMS:  Noncontributory.   PHYSICAL  EXAMINATION:  GENERAL: A well-developed, well-nourished, pleasant,  black female in no acute distress.  VITAL SIGNS: Afebrile, vital signs stable. Please see blood pressures as  above.  SKIN:  Warm and dry without lesions.  LYMPHS: There is no supraclavicular, cervical, or inguinal adenopathy.  HEENT:  Normocephalic.  NECK: Supple without thyromegaly.  CHEST:  Lungs are clear.  HEART:  Regular rate and rhythm without murmurs, rubs, or gallops.  BREASTS: Deferred.  ABDOMEN: Soft and nontender without masses or organomegaly.  Fundal height  is approximately 34 cm.  Fetal heart tones are auscultated with a Doppler.  MUSCULOSKELETAL:  1 to 2+ edema.  Deep tendon reflexes were only 1+ and  equal without any evidence of clonus.  PELVIC: Deferred at this time.   IMPRESSION:  1. Intrauterine pregnancy at [redacted] weeks gestation.  2. Chronic hypertension.  3. Grand multiparity.  4. Alcohol use (question abuse) during this pregnancy.  5. Smoker.  6. Rule out preeclampsia.   PLAN:  Hospitalize.  Check labs and ultrasound. Observe blood pressures  closely.  Evaluate the need for further expectant management with fetal  surveillance versus delivery.                                               James A. Ashley Royalty, M.D.    JAM/MEDQ  D:  01/10/2003  T:  01/10/2003  Job:  [id number]

## 2010-09-14 NOTE — Discharge Summary (Signed)
NAME:  [name], [place]                          ACCOUNT NO.:  [id number]   MEDICAL RECORD NO.:  [id number]                   PATIENT TYPE:  INP   LOCATION:  9159                                 [facility]   PHYSICIAN:  Rudy Jew. Ashley Royalty, M.D.             DATE OF BIRTH:  October 24, 1975   DATE OF ADMISSION:  02/11/2003  DATE OF DISCHARGE:  02/13/2003                                 DISCHARGE SUMMARY   HISTORY OF PRESENT ILLNESS:  The patient is a 35 year old G6, P5 with an EDC  of February 28, 2003 at 37 weeks and 5 days gestation with a prenatal care  which was complicated by hypertension, Trichomonas, and Chlamydia.  The  patient also was abusive of alcohol and tobacco during her pregnancy with a  history of genital herpes and noncompliance with her OB care.  She presented  to the office on October 15 with blood pressures of 158/94 without any other  symptoms of PIH.  She was admitted for evaluation of her blood pressure and  fetal surveillance and ruling out the possibility of delivery.  Upon  admission to the hospital her blood pressure was 133/95.  Other vital signs  were stable.  Hemoglobin at this time was 10.4 with hematocrit of 30.3.  Fetal heart rate was reactive.  Comprehensive metabolic panel was okay with  a normal SGOT and SGPT.  A 24-hour urine was initiated at this time.  On  October 17 blood pressures were in the 130/80s.  The patient was stable.  Protein was noted at 77 mg in 24 hours.  A BPP was ordered at this time  which revealed a score of 8/8 with a grade 3 placenta noted.  The patient  was discharged home at this time with orders for bed rest, to return to the  office within two to three days for blood pressure evaluation.     Velora Mediate, NP                           Rudy Jew. Ashley Royalty, M.D.    TC/MEDQ  D:  03/31/2003  T:  03/31/2003  Job:  [id number]

## 2011-01-31 ENCOUNTER — Inpatient Hospital Stay (HOSPITAL_COMMUNITY)
Admission: RE | Admit: 2011-01-31 | Discharge: 2011-01-31 | Disposition: A | Discharge status: Home / Self Care | Payer: Self-pay | Source: Ambulatory Visit | Attending: Family Medicine | Admitting: Family Medicine

## 2011-08-10 ENCOUNTER — Emergency Department (HOSPITAL_COMMUNITY)
Admission: EM | Admit: 2011-08-10 | Discharge: 2011-08-10 | Disposition: A | Discharge status: Home / Self Care | Payer: Medicaid Other | Source: Home / Self Care | Attending: Emergency Medicine | Admitting: Emergency Medicine

## 2011-08-10 ENCOUNTER — Encounter (HOSPITAL_COMMUNITY): Payer: Self-pay

## 2011-08-10 DIAGNOSIS — W57XXXA Bitten or stung by nonvenomous insect and other nonvenomous arthropods, initial encounter: Secondary | ICD-10-CM

## 2011-08-10 DIAGNOSIS — I10 Essential (primary) hypertension: Secondary | ICD-10-CM

## 2011-08-10 DIAGNOSIS — T148 Other injury of unspecified body region: Secondary | ICD-10-CM

## 2011-08-10 DIAGNOSIS — T148XXA Other injury of unspecified body region, initial encounter: Secondary | ICD-10-CM

## 2011-08-10 MED ORDER — CLONIDINE HCL 0.1 MG PO TABS
ORAL_TABLET | ORAL | Status: AC
  Filled 2011-08-10: qty 2

## 2011-08-10 MED ORDER — CLONIDINE HCL 0.1 MG PO TABS
0.2000 mg | ORAL_TABLET | Freq: Once | ORAL | Status: AC
  Administered 2011-08-10: 0.2 mg via ORAL

## 2011-08-10 MED ORDER — TRIAMCINOLONE ACETONIDE 0.1 % EX CREA: TOPICAL_CREAM | Freq: Three times a day (TID) | CUTANEOUS | Status: AC

## 2011-08-10 MED ORDER — PREDNISONE 5 MG PO KIT: 1.0000 | PACK | Freq: Every day | ORAL | Status: DC

## 2011-08-10 MED ORDER — METOPROLOL TARTRATE 50 MG PO TABS: 25.0000 mg | ORAL_TABLET | Freq: Two times a day (BID) | ORAL | Status: DC

## 2011-08-10 NOTE — Discharge Instructions (Signed)
Bedbugs Bedbugs are tiny bugs that live in and around beds. During the day, they hide in mattresses and other places near beds. They come out at night and bite people lying in bed. They need blood to live and grow. Bedbugs can be found in beds anywhere. Usually, they are found in places where many people come and go (hotels, shelters, hospitals). It does not matter whether the place is dirty or clean. Getting bitten by bedbugs rarely causes a medical problem. The biggest problem can be getting rid of them. This often takes the work of a pest control expert. CAUSES  Less use of pesticides. Bedbugs were common before the 1950s. Then, strong pesticides such as DDT nearly wiped them out. Today, these pesticides are not used because they harm the environment and can cause health problems.   More travel. Besides mattresses, bedbugs can also live in clothing and luggage. They can come along as people travel from place to place. Bedbugs are more common in certain parts of the world. When people travel to those areas, the bugs can come home with them.   Presence of birds and bats. Bedbugs often infest birds and bats. If you have these animals in or near your home, bedbugs may infest your house, too.  SYMPTOMS It does not hurt to be bitten by a bedbug. You will probably not wake up when you are bitten. Bedbugs usually bite areas of the skin that are not covered. Symptoms may show when you wake up, or they may take a day or more to show up. Symptoms may include:  Small red bumps on the skin. These might be lined up in a row or clustered in a group.   A darker red dot in the middle of red bumps.   Blisters on the skin. There may be swelling and very bad itching. These may be signs of an allergic reaction. This does not happen often.  DIAGNOSIS Bedbug bites might look and feel like other types of insect bites. The bugs do not stay on the body like ticks or lice. They bite, drop off, and crawl away to hide.  Your caregiver will probably:  Ask about your symptoms.   Ask about your recent activities and travel.   Check your skin for bedbug bites.   Ask you to check at home for signs of bedbugs. You should look for:   Spots or stains on the bed or nearby. This could be from bedbugs that were crushed or from their eggs or waste.   Bedbugs themselves. They are reddish-brown, oval, and flat. They do not fly. They are about the size of an apple seed.   Places to look for bedbugs include:   Beds. Check mattresses, headboards, box springs, and bed frames.   On drapes and curtains near the bed.   Under carpeting in the bedroom.   Behind electrical outlets.   Behind any wallpaper that is peeling.   Inside luggage.  TREATMENT Most bedbug bites do not need treatment. They usually go away on their own in a few days. The bites are not dangerous. However, treatment may be needed if you have scratched so much that your skin has become infected. You may also need treatment if you are allergic to bedbug bites. Treatment options include:  A drug that stops swelling and itching (corticosteroid). Usually, a cream is rubbed on the skin. If you have a bad rash, you may be given a corticosteroid pill.   Oral antihistamines. These are   pills to help control itching.   Antibiotic medicines. An antibiotic may be prescribed for infected skin.  HOME CARE INSTRUCTIONS   Take any medicine prescribed by your caregiver for your bites. Follow the directions carefully.   Consider wearing pajamas with long sleeves and pant legs.   Your bedroom may need to be treated. A pest control expert should make sure the bedbugs are gone. You may need to throw away mattresses or luggage. Ask the pest control expert what you can do to keep the bedbugs from coming back. Common suggestions include:   Putting a plastic cover over your mattress.   Washing and drying your clothes and bedding in hot water and a hot dryer. The  temperature should be hotter than 120 F (48.9 C). Bedbugs are killed by high temperatures.   Vacuuming carefully all around your bed. Vacuum in all cracks and crevices where the bugs might hide. Do this often.   Carefully checking all used furniture, bedding, or clothes that you bring into your house.   Eliminating bird nests and bat roosts.   If you get bedbug bites when traveling, check all your possessions carefully before bringing them into your house. If you find any bugs on clothes or in your luggage, consider throwing those items away.  SEEK MEDICAL CARE IF:  You have red bug bites that keep coming back.   You have red bug bites that itch badly.   You have bug bites that cause a skin rash.   You have scratch marks that are red and sore.  SEEK IMMEDIATE MEDICAL CARE IF: You have a fever. Document Released: 05/18/2010 Document Revised: 04/04/2011 Document Reviewed: 05/18/2010 [facility] Patient Information 2012 [place], [place].  Blood pressure over the ideal can put you at higher risk for stroke, heart disease, and kidney failure.  For this reason, it's important to try to get your blood pressure as close as possible to the ideal.  The ideal blood pressure is 120/80.  Blood pressures from 120-139 systolic over 80-89 diastolic are labeled as "prehypertension."  This means you are at higher risk of developing hypertension in the future.  Blood pressures in this range are not treated with medication, but lifestyle changes are recommended to prevent progression to hypertension.  Blood pressures of 140 and above systolic over 90 and above diastolic are classified as hypertension and are treated with medications.  Lifestyle changes which can benefit both prehypertension and hypertension include the following:   Salt and sodium restriction.  Weight loss.  Regular exercise.  Avoidance of tobacco.  Avoidance of excess alcohol.  The "D.A.S.H" diet.   People with hypertension and  prehypertension should limit their salt intake to less than 1500 mg daily.  Reading the nutrition information on the label of many prepared foods can give you an idea of how much sodium you're consuming at each meal.  Remember that the most important number on the nutrition information is the serving size.  It may be smaller than you think.  Try to avoid adding extra salt at the table.  You may add small amounts of salt while cooking.  Remember that salt is an acquired taste and you may get used to a using a whole lot less salt than you are using now.  Using less salt lets the food's natural flavors come through.  You might want to consider using salt substitutes, potassium chloride, pepper, or blends of herbs and spices to enhance the flavor of your food.  Foods that contain  the most salt include: processed meats (like ham, bacon, lunch meat, sausage, hot dogs, and breakfast meat), chips, pretzels, salted nuts, soups, salty snacks, canned foods, junk food, fast food, restaurant food, mustard, pickles, pizza, popcorn, soy sauce, and worcestershire sauce--quite a list!  You might ask, "Is there anything I can eat?"  The answer is, "yes."  Fruits and vegetables are usually low in salt.  Fresh is better than frozen which is better than canned.  If you have canned vegetables, you can cut down on the salt content by rinsing them in tap water 3 times before cooking.     Weight loss is the second thing you can do to lower your blood pressure.  Getting to and maintaining ideal weight will often normalize your blood pressure and allow you to avoid medications, entirely, cut way down on your dosage of medications, or allow to wean off your meds.  (Note, this should only be done under the supervision of your primary care doctor.)  Of course, weight loss takes time and you may need to be on medication in the meantime.  You shoot for a body mass index of 20-25.  When you go to the urgent care or to your primary care doctor,  they should calculate your BMI.  If you don't know what it is, ask.  You can calculate your BMI with the following formula:  Weight in pounds x 703/ (height in inches) x (height in inches).  There are many good diets out there: Weight Watchers and the D.A.S.H. Diet are the best, but often, just modifying a few factors can be helpful:  Don't skip meals, don't eat out, and keeping a food diary.  I do not recommend fad diets or diet pills which often raise blood pressure.    Everyone should get regular exercise, but this is particularly important for people with high blood pressure.  Just about any exercise is good.  The only exercise which may be harmful is lifting extreme heavy weights.  I recommend moderate exercise such as walking for 30 minutes 5 days a week.  Going to the gym for a 50 minute workout 3 times a week is also good.  This amounts to 150 minutes of exercise weekly.   Anyone with high blood pressure should avoid any use of tobacco.  Tobacco use does not elevate blood pressure, but it increases the risk of heart disease and stroke.  If you are interested in quitting, discuss with your doctor how to quit.  If you are not interested in quitting, ask yourself, "What would my life be like in 10 years if I continue to smoke?"  "How will I know when it is time to quit?"  "How would my life be better if I were to quit."   Excess alcohol intake can raise the blood pressure.  The safe alcohol intake is 2 drinks or less per day for men and 1 drink per day or less for women.   There is a very good diet which I recommend that has been designed for people with blood pressure called the D.A.S.H. Diet (dietary approaches to stop hypertension).  It consists of fruits, vegetables, lean meats, low fat dairy, whole grains, nuts and seeds.  It is very low in salt and sodium.  It has also been found to have other beneficial health effects such as lowering cholesterol and helping lose weight.  It has been  developed by the [employer] and can be downloaded from  the internet without any cost. Just do a Programmer, multimedia on "D.A.S.H. Diet." or go the NIH website (GolfingPosters.tn).  There are also cookbooks and diet plans that can be gotten from [place] to help you with this diet.

## 2011-08-10 NOTE — ED Provider Notes (Signed)
Chief Complaint  Patient presents with  . Rash    History of Present Illness:   [name] is a 36 year old female with high blood pressure who has had a two-day history of a pruritic rash. She first noted it an itchy, red bump on her right wrist 2 days ago. Ever since then she's had a couple more bumps on her right hand and one on her left wrist as well. These are very pruritic. Recently she moved to a motel, and the bumps appear in the morning after she slept in the bed at night. She denies any difficulty breathing.  She is taking amlodipine and lisinopril/HCTZ for blood pressure. She denies headaches, dizziness, shortness of breath, or chest pain. She was on metoprolol, but for some reason her doctor stopped this.  Review of Systems:  Other than noted above, the patient denies any of the following symptoms: Systemic:  No fever, chills, sweats, weight loss, or fatigue. ENT:  No nasal congestion, rhinorrhea, sore throat, swelling of lips, tongue or throat. Resp:  No cough, wheezing, or shortness of breath. Skin:  No rash, itching, nodules, or suspicious lesions.  PMFSH:  Past medical history, family history, social history, meds, and allergies were reviewed.  Physical Exam:   Vital signs:  BP 144/96  Pulse 73  Temp(Src) 98.5 F (36.9 C) (Oral)  Resp 17  SpO2 100%  LMP 08/01/2011 Filed Vitals:   08/10/11 1735 08/10/11 1736 08/10/11 1835 08/10/11 1856  BP: 189/131 194/131 174/114 144/96  Pulse: 73     Temp: 98.5 F (36.9 C)     TempSrc: Oral     Resp: 17     SpO2: 100%       Gen:  Alert, oriented, in no distress. Skin:  She has erythematous, excoriated papules on her right wrist, right hand, and left wrist. Skin was otherwise clear.  Course in Urgent Care Center:   She was given clonidine 0.2 mg by mouth. Thereafter her blood pressure came down as demonstrated above.   Assessment:  The primary encounter diagnosis was Insect bites. A diagnosis of Hypertension was also  pertinent to this visit. These are probably bed bug bites. I have told the patient that she will need to move away from the motel and have all of her clothing and luggage sprayed and decontaminated.  Plan:   1.  The following meds were prescribed:   New Prescriptions   METOPROLOL (LOPRESSOR) 50 MG TABLET    Take 0.5 tablets (25 mg total) by mouth 2 (two) times daily.   PREDNISONE 5 MG KIT    Take 1 kit (5 mg total) by mouth daily after breakfast. Prednisone 5 mg 6 day dosepack.  Take as directed.   TRIAMCINOLONE CREAM (KENALOG) 0.1 %    Apply topically 3 (three) times daily.   2.  The patient was instructed in symptomatic care and handouts were given. 3.  The patient was told to return if becoming worse in any way, if no better in 3 or 4 days, and given some red flag symptoms that would indicate earlier return.  Follow up:  The patient was told to follow up with her primary care doctor next week with regard to her blood pressure.      Reuben Likes, MD 08/10/11 [phone]

## 2011-08-10 NOTE — ED Notes (Signed)
Pt has itchy rash on both arms and hands since yesterday.

## 2013-03-15 ENCOUNTER — Ambulatory Visit: Payer: Medicaid Other

## 2013-06-21 ENCOUNTER — Emergency Department (HOSPITAL_COMMUNITY): Payer: Medicaid Other

## 2013-06-21 ENCOUNTER — Inpatient Hospital Stay (HOSPITAL_COMMUNITY)
Admission: EM | Admit: 2013-06-21 | Discharge: 2013-06-24 | DRG: 287 | Disposition: A | Discharge status: Home / Self Care | Payer: Medicaid Other | Attending: Internal Medicine | Admitting: Internal Medicine

## 2013-06-21 ENCOUNTER — Encounter (HOSPITAL_COMMUNITY): Payer: Self-pay | Admitting: Emergency Medicine

## 2013-06-21 DIAGNOSIS — F1210 Cannabis abuse, uncomplicated: Secondary | ICD-10-CM | POA: Diagnosis present

## 2013-06-21 DIAGNOSIS — I5031 Acute diastolic (congestive) heart failure: Secondary | ICD-10-CM | POA: Diagnosis present

## 2013-06-21 DIAGNOSIS — F1010 Alcohol abuse, uncomplicated: Secondary | ICD-10-CM | POA: Diagnosis present

## 2013-06-21 DIAGNOSIS — Z79899 Other long term (current) drug therapy: Secondary | ICD-10-CM

## 2013-06-21 DIAGNOSIS — I509 Heart failure, unspecified: Secondary | ICD-10-CM | POA: Diagnosis present

## 2013-06-21 DIAGNOSIS — M25569 Pain in unspecified knee: Secondary | ICD-10-CM

## 2013-06-21 DIAGNOSIS — I169 Hypertensive crisis, unspecified: Secondary | ICD-10-CM

## 2013-06-21 DIAGNOSIS — Z91199 Patient's noncompliance with other medical treatment and regimen due to unspecified reason: Secondary | ICD-10-CM

## 2013-06-21 DIAGNOSIS — I110 Hypertensive heart disease with heart failure: Principal | ICD-10-CM | POA: Diagnosis present

## 2013-06-21 DIAGNOSIS — I10 Essential (primary) hypertension: Secondary | ICD-10-CM | POA: Diagnosis present

## 2013-06-21 DIAGNOSIS — G4700 Insomnia, unspecified: Secondary | ICD-10-CM

## 2013-06-21 DIAGNOSIS — B1710 Acute hepatitis C without hepatic coma: Secondary | ICD-10-CM

## 2013-06-21 DIAGNOSIS — K5900 Constipation, unspecified: Secondary | ICD-10-CM | POA: Diagnosis present

## 2013-06-21 DIAGNOSIS — D239 Other benign neoplasm of skin, unspecified: Secondary | ICD-10-CM

## 2013-06-21 DIAGNOSIS — I43 Cardiomyopathy in diseases classified elsewhere: Secondary | ICD-10-CM | POA: Diagnosis present

## 2013-06-21 DIAGNOSIS — B1920 Unspecified viral hepatitis C without hepatic coma: Secondary | ICD-10-CM | POA: Diagnosis present

## 2013-06-21 DIAGNOSIS — IMO0002 Reserved for concepts with insufficient information to code with codable children: Secondary | ICD-10-CM

## 2013-06-21 DIAGNOSIS — F17200 Nicotine dependence, unspecified, uncomplicated: Secondary | ICD-10-CM | POA: Diagnosis present

## 2013-06-21 DIAGNOSIS — Z9119 Patient's noncompliance with other medical treatment and regimen: Secondary | ICD-10-CM

## 2013-06-21 HISTORY — DX: Essential (primary) hypertension: I10

## 2013-06-21 HISTORY — DX: Unspecified viral hepatitis C without hepatic coma: B19.20

## 2013-06-21 LAB — I-STAT TROPONIN, ED
TROPONIN I, POC: 0.05 ng/mL (ref 0.00–0.08)
TROPONIN I, POC: 0.04 ng/mL (ref 0.00–0.08)
Troponin i, poc: 0.03 ng/mL (ref 0.00–0.08)

## 2013-06-21 LAB — CBC
HCT: 36 % (ref 36.0–46.0)
HCT: 35.9 % — ABNORMAL LOW (ref 36.0–46.0)
HEMOGLOBIN: 12.3 g/dL (ref 12.0–15.0)
Hemoglobin: 12.3 g/dL (ref 12.0–15.0)
MCH: 28.6 pg (ref 26.0–34.0)
MCH: 28.6 pg (ref 26.0–34.0)
MCHC: 34.2 g/dL (ref 30.0–36.0)
MCHC: 34.3 g/dL (ref 30.0–36.0)
MCV: 83.7 fL (ref 78.0–100.0)
MCV: 83.5 fL (ref 78.0–100.0)
PLATELETS: 170 10*3/uL (ref 150–400)
PLATELETS: 185 10*3/uL (ref 150–400)
RBC: 4.3 MIL/uL (ref 3.87–5.11)
RBC: 4.3 MIL/uL (ref 3.87–5.11)
RDW: 12.7 % (ref 11.5–15.5)
RDW: 12.5 % (ref 11.5–15.5)
WBC: 7.9 10*3/uL (ref 4.0–10.5)
WBC: 8.5 10*3/uL (ref 4.0–10.5)

## 2013-06-21 LAB — COMPREHENSIVE METABOLIC PANEL
ALT: 49 U/L — ABNORMAL HIGH (ref 0–35)
AST: 110 U/L — AB (ref 0–37)
Albumin: 3.3 g/dL — ABNORMAL LOW (ref 3.5–5.2)
Alkaline Phosphatase: 85 U/L (ref 39–117)
BUN: 12 mg/dL (ref 6–23)
CALCIUM: 8.3 mg/dL — AB (ref 8.4–10.5)
CO2: 22 mEq/L (ref 19–32)
CREATININE: 0.64 mg/dL (ref 0.50–1.10)
Chloride: 103 mEq/L (ref 96–112)
Glucose, Bld: 107 mg/dL — ABNORMAL HIGH (ref 70–99)
Potassium: 4.2 mEq/L (ref 3.7–5.3)
SODIUM: 139 meq/L (ref 137–147)
TOTAL PROTEIN: 6.9 g/dL (ref 6.0–8.3)
Total Bilirubin: 0.4 mg/dL (ref 0.3–1.2)

## 2013-06-21 LAB — MRSA PCR SCREENING: MRSA by PCR: NEGATIVE

## 2013-06-21 LAB — PROTIME-INR
INR: 0.89 (ref 0.00–1.49)
Prothrombin Time: 11.9 seconds (ref 11.6–15.2)

## 2013-06-21 LAB — APTT: aPTT: 25 seconds (ref 24–37)

## 2013-06-21 LAB — CREATININE, SERUM
CREATININE: 0.84 mg/dL (ref 0.50–1.10)
GFR calc non Af Amer: 87 mL/min — ABNORMAL LOW (ref >90)

## 2013-06-21 LAB — TROPONIN I
Troponin I: <0.3 ng/mL (ref <0.30)
Troponin I: <0.3 ng/mL (ref <0.30)

## 2013-06-21 LAB — PRO B NATRIURETIC PEPTIDE: PRO B NATRI PEPTIDE: 1120 pg/mL — AB (ref 0–125)

## 2013-06-21 MED ORDER — HYDROCHLOROTHIAZIDE 25 MG PO TABS: 25.0000 mg | ORAL_TABLET | Freq: Every day | ORAL | Status: DC

## 2013-06-21 MED ORDER — LISINOPRIL 40 MG PO TABS
40.0000 mg | ORAL_TABLET | Freq: Every day | ORAL | Status: DC
  Administered 2013-06-21 – 2013-06-23 (×3): 40 mg via ORAL
  Filled 2013-06-21: qty 1
  Filled 2013-06-21: qty 2
  Filled 2013-06-21: qty 1

## 2013-06-21 MED ORDER — HYDROCHLOROTHIAZIDE 25 MG PO TABS
25.0000 mg | ORAL_TABLET | Freq: Every day | ORAL | Status: DC
  Administered 2013-06-21 – 2013-06-24 (×4): 25 mg via ORAL
  Filled 2013-06-21 (×4): qty 1

## 2013-06-21 MED ORDER — KETOROLAC TROMETHAMINE 30 MG/ML IJ SOLN
30.0000 mg | Freq: Three times a day (TID) | INTRAMUSCULAR | Status: DC | PRN
  Administered 2013-06-21 – 2013-06-23 (×3): 30 mg via INTRAVENOUS
  Filled 2013-06-21 (×3): qty 1

## 2013-06-21 MED ORDER — LISINOPRIL 20 MG PO TABS: 40.0000 mg | ORAL_TABLET | Freq: Every day | ORAL | Status: DC

## 2013-06-21 MED ORDER — CLONIDINE HCL 0.2 MG PO TABS
0.2000 mg | ORAL_TABLET | Freq: Three times a day (TID) | ORAL | Status: DC | PRN
  Administered 2013-06-21: 0.2 mg via ORAL
  Filled 2013-06-21 (×2): qty 1

## 2013-06-21 MED ORDER — IBUPROFEN 400 MG PO TABS
600.0000 mg | ORAL_TABLET | Freq: Four times a day (QID) | ORAL | Status: DC | PRN
  Administered 2013-06-21: 600 mg via ORAL
  Filled 2013-06-21 (×2): qty 1

## 2013-06-21 MED ORDER — MORPHINE SULFATE 2 MG/ML IJ SOLN: 2.0000 mg | INTRAMUSCULAR | Status: DC | PRN

## 2013-06-21 MED ORDER — NITROGLYCERIN IN D5W 200-5 MCG/ML-% IV SOLN
5.0000 ug/min | Freq: Once | INTRAVENOUS | Status: AC
  Administered 2013-06-21: 5 ug/min via INTRAVENOUS
  Filled 2013-06-21: qty 250

## 2013-06-21 MED ORDER — SODIUM CHLORIDE 0.9 % IJ SOLN
3.0000 mL | Freq: Two times a day (BID) | INTRAMUSCULAR | Status: DC
  Administered 2013-06-21 – 2013-06-23 (×6): 3 mL via INTRAVENOUS
  Filled 2013-06-21: qty 3

## 2013-06-21 MED ORDER — HEPARIN SODIUM (PORCINE) 5000 UNIT/ML IJ SOLN
5000.0000 [IU] | Freq: Three times a day (TID) | INTRAMUSCULAR | Status: DC
  Administered 2013-06-21 – 2013-06-24 (×7): 5000 [IU] via SUBCUTANEOUS
  Filled 2013-06-21 (×11): qty 1

## 2013-06-21 MED ORDER — ASPIRIN 81 MG PO CHEW
324.0000 mg | CHEWABLE_TABLET | Freq: Once | ORAL | Status: AC
  Administered 2013-06-21: 324 mg via ORAL
  Filled 2013-06-21: qty 4

## 2013-06-21 MED ORDER — HEPARIN SODIUM (PORCINE) 5000 UNIT/ML IJ SOLN
5000.0000 [IU] | Freq: Three times a day (TID) | INTRAMUSCULAR | Status: DC
  Administered 2013-06-21 (×2): 5000 [IU] via SUBCUTANEOUS
  Filled 2013-06-21 (×3): qty 1

## 2013-06-21 MED ORDER — HYDRALAZINE HCL 25 MG PO TABS
25.0000 mg | ORAL_TABLET | Freq: Four times a day (QID) | ORAL | Status: DC
  Administered 2013-06-21 – 2013-06-23 (×10): 25 mg via ORAL
  Filled 2013-06-21 (×14): qty 1

## 2013-06-21 MED ORDER — NITROGLYCERIN 2 % TD OINT
0.5000 [in_us] | TOPICAL_OINTMENT | Freq: Four times a day (QID) | TRANSDERMAL | Status: DC
  Administered 2013-06-21 – 2013-06-22 (×5): 0.5 [in_us] via TOPICAL
  Filled 2013-06-21 (×2): qty 1
  Filled 2013-06-21: qty 30

## 2013-06-21 MED ORDER — MORPHINE SULFATE 2 MG/ML IJ SOLN
2.0000 mg | INTRAMUSCULAR | Status: DC | PRN
  Administered 2013-06-21 – 2013-06-23 (×4): 2 mg via INTRAVENOUS
  Filled 2013-06-21 (×4): qty 1

## 2013-06-21 MED ORDER — ONDANSETRON HCL 4 MG/2ML IJ SOLN
4.0000 mg | Freq: Four times a day (QID) | INTRAMUSCULAR | Status: DC | PRN
  Administered 2013-06-21: 4 mg via INTRAVENOUS
  Filled 2013-06-21: qty 2

## 2013-06-21 NOTE — ED Notes (Signed)
Spoke with Dr Darrick Meigs. Currently off nitro drip ordered patient to go to step down.

## 2013-06-21 NOTE — H&P (Signed)
[facility] History and Physical  [name] DOB: [birth year] DOA: 06/21/2013  Referring physician: EDP PCP: No PCP Per Patient   Chief Complaint: SOB   HPI: [name] is a 38 y.o. female h/o HTN and non-compliance with meds.  Patient presents to the ED with progressively worsening SOB for the past 2 days.  This has been associated with non-productive cough.  Dayquil with pseudophedrine did not help symptoms.  Patient reports she has not been on any of her BP meds for the past 6 months secondary to loss of insurance.  Review of her chart indicates when she was last seen by [facility] back in 2011 she was on norvasc 10, lisinopril-hctz 40-25, and toprol xr 25mg .  Work up in the ED was significant for initial BP of 220/139, not-suprisingly she was found to have pulmonary vascular congestion on CXR.  Review of Systems: Systems reviewed.  As above, otherwise negative  Past Medical History  Diagnosis Date  . Hypertension   . Hepatitis C    Past Surgical History  Procedure Laterality Date  . Liver biopsy     Social History:  reports that she has been smoking.  She does not have any smokeless tobacco history on file. She reports that she drinks alcohol. She reports that she uses illicit drugs (Marijuana).  No Known Allergies  No family history on file.   Prior to Admission medications   Medication Sig Start Date End Date Taking? Authorizing Provider  cetirizine (ZYRTEC) 10 MG tablet Take 10 mg by mouth daily.   Yes Historical Provider, MD  diphenhydrAMINE (BENADRYL) 25 MG tablet Take 25 mg by mouth once.   Yes Historical Provider, MD  Pseudoephedrine-APAP-DM (DAYQUIL PO) Take 30 mLs by mouth 2 (two) times daily as needed.   Yes Historical Provider, MD  Pseudoephedrine-Ibuprofen (IBUPROFEN COLD & SINUS PO) Take 1 capsule by mouth once.   Yes Historical Provider, MD   Physical Exam: Filed Vitals:   06/21/13 0210  BP: 188/127  Pulse:   Temp:    Resp: 26    BP 188/127  Pulse 125  Temp(Src) 98.4 F (36.9 C) (Oral)  Resp 26  Ht 5\' 2"  (1.575 m)  Wt 72.576 kg (160 lb)  BMI 29.26 kg/m2  SpO2 96%  LMP 06/02/2013  General Appearance:    Alert, oriented, no distress, appears stated age  Head:    Normocephalic, atraumatic  Eyes:    PERRL, EOMI, sclera non-icteric        Nose:   Nares without drainage or epistaxis. Mucosa, turbinates normal  Throat:   Moist mucous membranes. Oropharynx without erythema or exudate.  Neck:   Supple. No carotid bruits.  No thyromegaly.  No lymphadenopathy.   Back:     No CVA tenderness, no spinal tenderness  Lungs:     Clear to auscultation bilaterally, without wheezes, rhonchi or rales  Chest wall:    No tenderness to palpitation  Heart:    Regular rate and rhythm without murmurs, gallops, rubs  Abdomen:     Soft, non-tender, nondistended, normal bowel sounds, no organomegaly  Genitalia:    deferred  Rectal:    deferred  Extremities:   No clubbing, cyanosis or edema.  Pulses:   2+ and symmetric all extremities  Skin:   Skin color, texture, turgor normal, no rashes or lesions  Lymph nodes:   Cervical, supraclavicular, and axillary nodes normal  Neurologic:   CNII-XII intact. Normal strength, sensation and reflexes  throughout    Labs on Admission:  Basic Metabolic Panel:  Recent Labs Lab 06/21/13 0208  NA 139  K 4.2  CL 103  CO2 22  GLUCOSE 107*  BUN 12  CREATININE 0.64  CALCIUM 8.3*   Liver Function Tests:  Recent Labs Lab 06/21/13 0208  AST 110*  ALT 49*  ALKPHOS 85  BILITOT 0.4  PROT 6.9  ALBUMIN 3.3*   No results found for this basename: LIPASE, AMYLASE,  in the last 168 hours No results found for this basename: AMMONIA,  in the last 168 hours CBC:  Recent Labs Lab 06/21/13 0208  WBC 7.9  HGB 12.3  HCT 36.0  MCV 83.7  PLT 170   Cardiac Enzymes: No results found for this basename: CKTOTAL, CKMB, CKMBINDEX, TROPONINI,  in the last 168 hours  BNP (last  3 results)  Recent Labs  06/21/13 0208  PROBNP 1120.0*   CBG: No results found for this basename: GLUCAP,  in the last 168 hours  Radiological Exams on Admission: Dg Chest Portable 1 View  06/21/2013   CLINICAL DATA:  Shortness of breath.  EXAM: PORTABLE CHEST - 1 VIEW  COMPARISON:  None.  FINDINGS: The lungs are well-aerated. Mild bibasilar airspace opacities may reflect mild interstitial edema or possibly mild pneumonia. Vascular congestion is seen. No pleural effusion or pneumothorax is identified.  The cardiomediastinal silhouette is mildly enlarged. No acute osseous abnormalities are seen.  IMPRESSION: Vascular congestion and mild cardiomegaly noted; mild bibasilar airspace opacities may reflect mild interstitial edema or possibly mild pneumonia.   Electronically Signed   By: Garald Balding M.D.   On: 06/21/2013 01:40    EKG: Independently reviewed.  Assessment/Plan Principal Problem:   Hypertensive crisis Active Problems:   HYPERTENSION   1. Hypertensive crisis - BP down to 180/125 on 40 nitro gtt and thankfully patients symptoms have resolved other than a headache she developed due to the NTG; have ordered HCTZ to be given now, then lisinopril to be given in a couple of hours, during this time the NTG drip should be titrated down as the PO meds take effect with ultimate goal to titrate patient off the nitro drip.   Code Status: Full Code  Family Communication: Family at bedside Disposition Plan: Admit to SDU   Time spent: 70 min  Enolia Koepke M. [facility] Pager [phone]  If 7AM-7PM, please contact the day team taking care of the patient [email or web address] Password [facility] 06/21/2013, 3:48 AM

## 2013-06-21 NOTE — ED Provider Notes (Signed)
CSN: [id number]     Arrival date & time 06/21/13  0048 History   First MD Initiated Contact with Patient 06/21/13 0101     Chief Complaint  Patient presents with  . Shortness of Breath     (Consider location/radiation/quality/duration/timing/severity/associated sxs/prior Treatment) HPI Comments: 38 year old female, history of hypertension, she admits that she is noncompliant with her medications and also admits to having a history of "enlarged heart". She presents with shortness of breath which started yesterday, was acute in onset, persistent, severe, worsened by lying flat and exertion. She has minimal associated productive cough but no fevers. She denies any swelling in her legs, she does have associated chest pain. She had similar symptoms one week ago which lasted overnight but that resolved and she was back to normal. She has not had her blood pressure meds in over 6 months sighting the fact that she has no insurance.    Patient is a 38 y.o. female presenting with shortness of breath.  Shortness of Breath   Past Medical History  Diagnosis Date  . Hypertension   . Hepatitis C    Past Surgical History  Procedure Laterality Date  . Liver biopsy     No family history on file. History  Substance Use Topics  . Smoking status: Current Every Day Smoker  . Smokeless tobacco: Not on file  . Alcohol Use: Yes   OB History   Grav Para Term Preterm Abortions TAB SAB Ect Mult Living                 Review of Systems  Respiratory: Positive for shortness of breath.   All other systems reviewed and are negative.      Allergies  Review of patient's allergies indicates no known allergies.  Home Medications   Current Outpatient Rx  Name  Route  Sig  Dispense  Refill  . cetirizine (ZYRTEC) 10 MG tablet   Oral   Take 10 mg by mouth daily.         . diphenhydrAMINE (BENADRYL) 25 MG tablet   Oral   Take 25 mg by mouth once.         . Pseudoephedrine-APAP-DM (DAYQUIL PO)   Oral   Take 30 mLs by mouth 2 (two) times daily as needed.         . Pseudoephedrine-Ibuprofen (IBUPROFEN COLD & SINUS PO)   Oral   Take 1 capsule by mouth once.          BP 180/123  Pulse 100  Temp(Src) 98.4 F (36.9 C) (Oral)  Resp 22  Ht 5\' 2"  (1.575 m)  Wt 160 lb (72.576 kg)  BMI 29.26 kg/m2  SpO2 97%  LMP 06/02/2013 Physical Exam  Nursing note and vitals reviewed. Constitutional: She appears well-developed and well-nourished. She appears distressed.  HENT:  Head: Normocephalic and atraumatic.  Mouth/Throat: Oropharynx is clear and moist. No oropharyngeal exudate.  Eyes: Conjunctivae and EOM are normal. Pupils are equal, round, and reactive to light. Right eye exhibits no discharge. Left eye exhibits no discharge. No scleral icterus.  Neck: Normal range of motion. Neck supple. No JVD present. No thyromegaly present.  Cardiovascular: Regular rhythm, normal heart sounds and intact distal pulses.  Exam reveals no gallop and no friction rub.   No murmur heard. Tachycardia, normal capillary refill, normal pulses at the radial arteries, no JVD  Pulmonary/Chest: Effort normal and breath sounds normal. No respiratory distress. She has no wheezes. She has no rales.  Decreased sounds at  the bases bilaterally, no wheezing, no rales, increased work of breathing with accessory muscle use is present  Abdominal: Soft. Bowel sounds are normal. She exhibits no distension and no mass. There is no tenderness.  Musculoskeletal: Normal range of motion. She exhibits no edema and no tenderness.  Lymphadenopathy:    She has no cervical adenopathy.  Neurological: She is alert. Coordination normal.  Skin: Skin is warm and dry. No rash noted. No erythema.  Psychiatric: She has a normal mood and affect. Her behavior is normal.    ED Course  Procedures (including critical care time) Labs Review Labs Reviewed  COMPREHENSIVE METABOLIC PANEL - Abnormal; Notable for the following:    Glucose,  Bld 107 (*)    Calcium 8.3 (*)    Albumin 3.3 (*)    AST 110 (*)    ALT 49 (*)    All other components within normal limits  PRO B NATRIURETIC PEPTIDE - Abnormal; Notable for the following:    Pro B Natriuretic peptide (BNP) 1120.0 (*)    All other components within normal limits  APTT  CBC  PROTIME-INR  I-STAT TROPOININ, ED  Randolm Idol, ED   Imaging Review Dg Chest Portable 1 View  06/21/2013   CLINICAL DATA:  Shortness of breath.  EXAM: PORTABLE CHEST - 1 VIEW  COMPARISON:  None.  FINDINGS: The lungs are well-aerated. Mild bibasilar airspace opacities may reflect mild interstitial edema or possibly mild pneumonia. Vascular congestion is seen. No pleural effusion or pneumothorax is identified.  The cardiomediastinal silhouette is mildly enlarged. No acute osseous abnormalities are seen.  IMPRESSION: Vascular congestion and mild cardiomegaly noted; mild bibasilar airspace opacities may reflect mild interstitial edema or possibly mild pneumonia.   Electronically Signed   By: Garald Balding M.D.   On: 06/21/2013 01:40    EKG Interpretation    Date/Time:  Monday June 21 2013 01:01:16 EST Ventricular Rate:  128 PR Interval:  137 QRS Duration: 85 QT Interval:  336 QTC Calculation: 490 R Axis:   65 Text Interpretation:  Sinus tachycardia Probable left atrial enlargement Probable left ventricular hypertrophy Borderline prolonged QT interval Baseline wander in lead(s) I II V3 V4 V6 Abnormal ekg Since last tracing Poor R wave progression NOW PRESENT Nonspecific T wave abnormality NOW PRESENT Confirmed by Dane Bloch  MD, Kafi Dotter ([id number]) on 06/21/2013 2:31:01 AM            MDM   Final diagnoses:  Hypertensive crisis  Unspecified essential hypertension    The patient is severely hypertensive, 220/140 on initial blood pressure, she is requiring oxygen supplementation to get above 94%, she has no rales but has a history significant for severe hypertension I suggest has a  hypertensive CHF. She will the nitroglycerin drip, supplemental oxygen, chest x-ray, labs, I anticipate admission. The patient appears critically ill, she is requiring titratable nitroglycerin drip to control her severe hypertension.  The patient required a nitroglycerin drip, symptoms gradually improved but she remained persistently severely hypertensive. Labs overall showed that the patient had new-onset congestive heart failure, she will be admitted to the hospitalist service and to the step down unit for ongoing take blood pressure control. The patient has malignant hypertension and new-onset congestive heart failure  Meds given in ED:  Medications  heparin injection 5,000 Units (not administered)  sodium chloride 0.9 % injection 3 mL (3 mLs Intravenous Given 06/21/13 0542)  hydrochlorothiazide (HYDRODIURIL) tablet 25 mg (25 mg Oral Given 06/21/13 0539)  ketorolac (TORADOL) 30 MG/ML injection 30  mg (not administered)  lisinopril (PRINIVIL,ZESTRIL) tablet 40 mg (not administered)  ondansetron (ZOFRAN) injection 4 mg (not administered)  aspirin chewable tablet 324 mg (324 mg Oral Given 06/21/13 0127)  nitroGLYCERIN 0.2 mg/mL in dextrose 5 % infusion (30 mcg/min Intravenous Rate/Dose Change 06/21/13 0608)    CRITICAL CARE Performed by: Noemi Chapel D Total critical care time: 35 Critical care time was exclusive of separately billable procedures and treating other patients. Critical care was necessary to treat or prevent imminent or life-threatening deterioration. Critical care was time spent personally by me on the following activities: development of treatment plan with patient and/or surrogate as well as nursing, discussions with consultants, evaluation of patient's response to treatment, examination of patient, obtaining history from patient or surrogate, ordering and performing treatments and interventions, ordering and review of laboratory studies, ordering and review of radiographic studies,  pulse oximetry and re-evaluation of patient's condition.     Johnna Acosta, MD 06/21/13 [phone]

## 2013-06-21 NOTE — ED Notes (Signed)
Spoke with Admit Doctor about patient's blood pressure. Doctor currently at bedside and stated will assess patient and change orders.

## 2013-06-21 NOTE — ED Notes (Signed)
Dr. Darrick Meigs (Cavalero DR) ordered a change in PT medication regime. Provider ordered hydralazine & nitro paste 1/2" to be given to patient. 20 mins after administration of above medications titrate nitro drip down by 34mcgs/min, then Q 20 mins and titrate down by 21mcg/min until PT completely off drip.

## 2013-06-21 NOTE — ED Notes (Signed)
Patient presents to ED via [facility]. Patient c/o of "shortness of breath" x2 days which increased tonight. Patient also c/o of a non-productive/dry cough. Patient presents diaphoretic and having a hard time taking a deep breath. A&Ox4. Patient sitting in tripod position- which she states is the most comfortable. O2 sat 95% on RA.

## 2013-06-21 NOTE — ED Notes (Signed)
Paged [place].

## 2013-06-21 NOTE — Progress Notes (Signed)
Utilization Review Completed.Donne Anon T2/23/2015

## 2013-06-21 NOTE — ED Notes (Signed)
Spoke with MD Alcario Drought, per hospital protocol we cannot send a pt with drip titrated for BP to Stepdown unit. Per [facility] Ron Valda Lamb we cannot overflow pt to ICU while we still have beds available in Stepdown.  Therefore we will have to hold pt in ED until we can titrate BP down and off drip.  Oral antihypertensive ordered as well per MD Alcario Drought.

## 2013-06-21 NOTE — Progress Notes (Signed)
Subjective: Patient seen and examined, admitted this morning with hypertensive crisis, with shortness of breath and chest tightness. CXR showed pul vascular congestion. Filed Vitals:   06/21/13 0745  BP: 154/115  Pulse: 96  Temp:   Resp: 18    Chest: Clear Bilaterally Heart : S1S2 RRR Abdomen: Soft, nontender Ext : No edema Neuro: Alert, oriented x 3  A/P Hypertensive crisis Pulmonary edema Patient is currently on nitro drip, cannot move to SDU as they will not be able to titrate the drip. Will start Nitropaste 1/2 inch Q 6 hr, Hydralazine 25 mg po Q 6 hr. She has already received Lisinopril and HCTZ. Will also check cardiac enzymes and 2 d Echocardiogram.    [facility] [phone]

## 2013-06-22 LAB — COMPREHENSIVE METABOLIC PANEL
ALK PHOS: 52 U/L (ref 39–117)
ALT: 36 U/L — ABNORMAL HIGH (ref 0–35)
AST: 38 U/L — ABNORMAL HIGH (ref 0–37)
Albumin: 3.2 g/dL — ABNORMAL LOW (ref 3.5–5.2)
BILIRUBIN TOTAL: 0.5 mg/dL (ref 0.3–1.2)
BUN: 12 mg/dL (ref 6–23)
CHLORIDE: 101 meq/L (ref 96–112)
CO2: 23 mEq/L (ref 19–32)
Calcium: 8.3 mg/dL — ABNORMAL LOW (ref 8.4–10.5)
Creatinine, Ser: 0.71 mg/dL (ref 0.50–1.10)
GFR calc Af Amer: >90 mL/min (ref >90)
GFR calc non Af Amer: >90 mL/min (ref >90)
Glucose, Bld: 85 mg/dL (ref 70–99)
POTASSIUM: 3.9 meq/L (ref 3.7–5.3)
SODIUM: 137 meq/L (ref 137–147)
Total Protein: 6.6 g/dL (ref 6.0–8.3)

## 2013-06-22 LAB — CBC
HEMATOCRIT: 36 % (ref 36.0–46.0)
HEMOGLOBIN: 12.4 g/dL (ref 12.0–15.0)
MCH: 28.7 pg (ref 26.0–34.0)
MCHC: 34.4 g/dL (ref 30.0–36.0)
MCV: 83.3 fL (ref 78.0–100.0)
Platelets: 169 10*3/uL (ref 150–400)
RBC: 4.32 MIL/uL (ref 3.87–5.11)
RDW: 12.6 % (ref 11.5–15.5)
WBC: 7.2 10*3/uL (ref 4.0–10.5)

## 2013-06-22 MED ORDER — ATORVASTATIN CALCIUM 80 MG PO TABS
80.0000 mg | ORAL_TABLET | ORAL | Status: AC
  Administered 2013-06-22: 80 mg via ORAL
  Filled 2013-06-22: qty 1

## 2013-06-22 MED ORDER — BISACODYL 10 MG RE SUPP
10.0000 mg | Freq: Once | RECTAL | Status: AC
  Administered 2013-06-22: 10 mg via RECTAL
  Filled 2013-06-22: qty 1

## 2013-06-22 MED ORDER — AMLODIPINE BESYLATE 5 MG PO TABS
5.0000 mg | ORAL_TABLET | Freq: Every day | ORAL | Status: DC
  Administered 2013-06-22 – 2013-06-23 (×2): 5 mg via ORAL
  Filled 2013-06-22 (×2): qty 1

## 2013-06-22 MED ORDER — FUROSEMIDE 10 MG/ML IJ SOLN
20.0000 mg | Freq: Once | INTRAMUSCULAR | Status: AC
  Administered 2013-06-22: 20 mg via INTRAVENOUS

## 2013-06-22 MED ORDER — SODIUM CHLORIDE 0.9 % IJ SOLN: 3.0000 mL | INTRAMUSCULAR | Status: DC | PRN

## 2013-06-22 MED ORDER — CLOPIDOGREL BISULFATE 75 MG PO TABS
75.0000 mg | ORAL_TABLET | ORAL | Status: AC
  Administered 2013-06-23: 75 mg via ORAL
  Filled 2013-06-22: qty 1

## 2013-06-22 MED ORDER — SODIUM CHLORIDE 0.9 % IV SOLN
INTRAVENOUS | Status: DC
  Administered 2013-06-22: 21:00:00 via INTRAVENOUS

## 2013-06-22 MED ORDER — SODIUM CHLORIDE 0.9 % IJ SOLN
3.0000 mL | Freq: Two times a day (BID) | INTRAMUSCULAR | Status: DC
  Administered 2013-06-22 – 2013-06-23 (×2): 3 mL via INTRAVENOUS

## 2013-06-22 MED ORDER — ATORVASTATIN CALCIUM 80 MG PO TABS
80.0000 mg | ORAL_TABLET | Freq: Every day | ORAL | Status: DC
  Administered 2013-06-23: 80 mg via ORAL
  Filled 2013-06-22 (×2): qty 1

## 2013-06-22 MED ORDER — ASPIRIN 81 MG PO CHEW
81.0000 mg | CHEWABLE_TABLET | ORAL | Status: AC
  Administered 2013-06-23: 81 mg via ORAL
  Filled 2013-06-22: qty 1

## 2013-06-22 MED ORDER — DIAZEPAM 5 MG PO TABS
5.0000 mg | ORAL_TABLET | ORAL | Status: AC
  Administered 2013-06-23: 5 mg via ORAL
  Filled 2013-06-22: qty 1

## 2013-06-22 MED ORDER — METOPROLOL TARTRATE 25 MG PO TABS
25.0000 mg | ORAL_TABLET | Freq: Two times a day (BID) | ORAL | Status: DC
  Administered 2013-06-22 – 2013-06-24 (×5): 25 mg via ORAL
  Filled 2013-06-22 (×6): qty 1

## 2013-06-22 MED ORDER — SODIUM CHLORIDE 0.9 % IV SOLN: 250.0000 mL | INTRAVENOUS | Status: DC | PRN

## 2013-06-22 NOTE — Consult Note (Signed)
[name] is an 38 y.o. female.   Chief Complaint: Chest pressure and shortness of breath. HPI: 38 year old female with hypertension and non-compliance with medications has progressively worsening shortness of breath x 3-4 days. Chest x-ray suggestive of vascular congestion and possible edema ( Pro BNP of 1120) or mild pneumonia. Echocardiogram showed dilated and possible ischemic or non-ischemic cardiomyopathy. She also has history of smoking and illicit drug use( Marijuana).   Past Medical History  Diagnosis Date  . Hypertension   . Hepatitis C       Past Surgical History  Procedure Laterality Date  . Liver biopsy      History reviewed. No pertinent family history. Social History:  reports that she has been smoking.  She does not have any smokeless tobacco history on file. She reports that she drinks alcohol. She reports that she uses illicit drugs (Marijuana).  Allergies: No Known Allergies  Medications Prior to Admission  Medication Sig Dispense Refill  . cetirizine (ZYRTEC) 10 MG tablet Take 10 mg by mouth daily.      . diphenhydrAMINE (BENADRYL) 25 MG tablet Take 25 mg by mouth once.      . Pseudoephedrine-APAP-DM (DAYQUIL PO) Take 30 mLs by mouth 2 (two) times daily as needed.      . Pseudoephedrine-Ibuprofen (IBUPROFEN COLD & SINUS PO) Take 1 capsule by mouth once.        Results for orders placed during the hospital encounter of 06/21/13 (from the past 48 hour(s))  APTT     Status: None   Collection Time    06/21/13  2:08 AM      Result Value Ref Range   aPTT 25  24 - 37 seconds  CBC     Status: None   Collection Time    06/21/13  2:08 AM      Result Value Ref Range   WBC 7.9  4.0 - 10.5 K/uL   RBC 4.30  3.87 - 5.11 MIL/uL   Hemoglobin 12.3  12.0 - 15.0 g/dL   HCT 36.0  36.0 - 46.0 %   MCV 83.7  78.0 - 100.0 fL   MCH 28.6  26.0 - 34.0 pg   MCHC 34.2  30.0 - 36.0 g/dL   RDW 12.7  11.5 - 15.5 %   Platelets 170  150 - 400 K/uL  COMPREHENSIVE METABOLIC  PANEL     Status: Abnormal   Collection Time    06/21/13  2:08 AM      Result Value Ref Range   Sodium 139  137 - 147 mEq/L   Potassium 4.2  3.7 - 5.3 mEq/L   Chloride 103  96 - 112 mEq/L   CO2 22  19 - 32 mEq/L   Glucose, Bld 107 (*) 70 - 99 mg/dL   BUN 12  6 - 23 mg/dL   Creatinine, Ser 0.64  0.50 - 1.10 mg/dL   Calcium 8.3 (*) 8.4 - 10.5 mg/dL   Total Protein 6.9  6.0 - 8.3 g/dL   Albumin 3.3 (*) 3.5 - 5.2 g/dL   AST 110 (*) 0 - 37 U/L   ALT 49 (*) 0 - 35 U/L   Alkaline Phosphatase 85  39 - 117 U/L   Total Bilirubin 0.4  0.3 - 1.2 mg/dL   GFR calc non Af Amer >90  >90 mL/min   GFR calc Af Amer >90  >90 mL/min   Comment: (NOTE)     The eGFR has been calculated  using the CKD EPI equation.     This calculation has not been validated in all clinical situations.     eGFR's persistently <90 mL/min signify possible Chronic Kidney     Disease.  PROTIME-INR     Status: None   Collection Time    06/21/13  2:08 AM      Result Value Ref Range   Prothrombin Time 11.9  11.6 - 15.2 seconds   INR 0.89  0.00 - 1.49  PRO B NATRIURETIC PEPTIDE     Status: Abnormal   Collection Time    06/21/13  2:08 AM      Result Value Ref Range   Pro B Natriuretic peptide (BNP) 1120.0 (*) 0 - 125 pg/mL  I-STAT TROPOININ, ED     Status: None   Collection Time    06/21/13  3:27 AM      Result Value Ref Range   Troponin i, poc 0.05  0.00 - 0.08 ng/mL   Comment 3            Comment: Due to the release kinetics of cTnI,     a negative result within the first hours     of the onset of symptoms does not rule out     myocardial infarction with certainty.     If myocardial infarction is still suspected,     repeat the test at appropriate intervals.  Randolm Idol, ED     Status: None   Collection Time    06/21/13  6:21 AM      Result Value Ref Range   Troponin i, poc 0.04  0.00 - 0.08 ng/mL   Comment 3            Comment: Due to the release kinetics of cTnI,     a negative result within the first  hours     of the onset of symptoms does not rule out     myocardial infarction with certainty.     If myocardial infarction is still suspected,     repeat the test at appropriate intervals.  Randolm Idol, ED     Status: None   Collection Time    06/21/13  9:28 AM      Result Value Ref Range   Troponin i, poc 0.03  0.00 - 0.08 ng/mL   Comment 3            Comment: Due to the release kinetics of cTnI,     a negative result within the first hours     of the onset of symptoms does not rule out     myocardial infarction with certainty.     If myocardial infarction is still suspected,     repeat the test at appropriate intervals.  MRSA PCR SCREENING     Status: None   Collection Time    06/21/13  3:50 PM      Result Value Ref Range   MRSA by PCR NEGATIVE  NEGATIVE   Comment:            The GeneXpert MRSA Assay (FDA     approved for NASAL specimens     only), is one component of a     comprehensive MRSA colonization     surveillance program. It is not     intended to diagnose MRSA     infection nor to guide or     monitor treatment for     MRSA infections.  TROPONIN I  Status: None   Collection Time    06/21/13  4:40 PM      Result Value Ref Range   Troponin I <0.30  <0.30 ng/mL   Comment:            Due to the release kinetics of cTnI,     a negative result within the first hours     of the onset of symptoms does not rule out     myocardial infarction with certainty.     If myocardial infarction is still suspected,     repeat the test at appropriate intervals.  CBC     Status: Abnormal   Collection Time    06/21/13  4:40 PM      Result Value Ref Range   WBC 8.5  4.0 - 10.5 K/uL   RBC 4.30  3.87 - 5.11 MIL/uL   Hemoglobin 12.3  12.0 - 15.0 g/dL   HCT 35.9 (*) 36.0 - 46.0 %   MCV 83.5  78.0 - 100.0 fL   MCH 28.6  26.0 - 34.0 pg   MCHC 34.3  30.0 - 36.0 g/dL   RDW 12.5  11.5 - 15.5 %   Platelets 185  150 - 400 K/uL  CREATININE, SERUM     Status: Abnormal    Collection Time    06/21/13  4:40 PM      Result Value Ref Range   Creatinine, Ser 0.84  0.50 - 1.10 mg/dL   GFR calc non Af Amer 87 (*) >90 mL/min   GFR calc Af Amer >90  >90 mL/min   Comment: (NOTE)     The eGFR has been calculated using the CKD EPI equation.     This calculation has not been validated in all clinical situations.     eGFR's persistently <90 mL/min signify possible Chronic Kidney     Disease.  TROPONIN I     Status: None   Collection Time    06/21/13  8:10 PM      Result Value Ref Range   Troponin I <0.30  <0.30 ng/mL   Comment:            Due to the release kinetics of cTnI,     a negative result within the first hours     of the onset of symptoms does not rule out     myocardial infarction with certainty.     If myocardial infarction is still suspected,     repeat the test at appropriate intervals.  CBC     Status: None   Collection Time    06/22/13  2:35 AM      Result Value Ref Range   WBC 7.2  4.0 - 10.5 K/uL   RBC 4.32  3.87 - 5.11 MIL/uL   Hemoglobin 12.4  12.0 - 15.0 g/dL   HCT 36.0  36.0 - 46.0 %   MCV 83.3  78.0 - 100.0 fL   MCH 28.7  26.0 - 34.0 pg   MCHC 34.4  30.0 - 36.0 g/dL   RDW 12.6  11.5 - 15.5 %   Platelets 169  150 - 400 K/uL  COMPREHENSIVE METABOLIC PANEL     Status: Abnormal   Collection Time    06/22/13  2:35 AM      Result Value Ref Range   Sodium 137  137 - 147 mEq/L   Potassium 3.9  3.7 - 5.3 mEq/L   Chloride 101  96 - 112 mEq/L     CO2 23  19 - 32 mEq/L   Glucose, Bld 85  70 - 99 mg/dL   BUN 12  6 - 23 mg/dL   Creatinine, Ser 0.71  0.50 - 1.10 mg/dL   Calcium 8.3 (*) 8.4 - 10.5 mg/dL   Total Protein 6.6  6.0 - 8.3 g/dL   Albumin 3.2 (*) 3.5 - 5.2 g/dL   AST 38 (*) 0 - 37 U/L   ALT 36 (*) 0 - 35 U/L   Alkaline Phosphatase 52  39 - 117 U/L   Total Bilirubin 0.5  0.3 - 1.2 mg/dL   GFR calc non Af Amer >90  >90 mL/min   GFR calc Af Amer >90  >90 mL/min   Comment: (NOTE)     The eGFR has been calculated using the CKD EPI  equation.     This calculation has not been validated in all clinical situations.     eGFR's persistently <90 mL/min signify possible Chronic Kidney     Disease.   Dg Chest Portable 1 View  06/21/2013   CLINICAL DATA:  Shortness of breath.  EXAM: PORTABLE CHEST - 1 VIEW  COMPARISON:  None.  FINDINGS: The lungs are well-aerated. Mild bibasilar airspace opacities may reflect mild interstitial edema or possibly mild pneumonia. Vascular congestion is seen. No pleural effusion or pneumothorax is identified.  The cardiomediastinal silhouette is mildly enlarged. No acute osseous abnormalities are seen.  IMPRESSION: Vascular congestion and mild cardiomegaly noted; mild bibasilar airspace opacities may reflect mild interstitial edema or possibly mild pneumonia.   Electronically Signed   By: Garald Balding M.D.   On: 06/21/2013 01:40    ROS No weight gain or loss, No vision change, No asthma, No COPD, + chest pain, No palpitation, + dyspnea on exertion, No GI or GU bleed, No hepatitis, No kidney stone, No stroke, Seizures or arthritis.  Blood pressure 145/110, pulse 115, temperature 99.1 F (37.3 C), temperature source Oral, resp. rate 20, height _0  (1.575 m), weight 76.8 kg (169 lb 5 oz), last menstrual period 06/02/2013, SpO2 99.00%. General : Alert, oriented, no distress, appears stated age  Head: Normocephalic, atraumatic, Brown eyes, PERL, Conj-pink, Sclera-white. Throat: Moist mucous membranes. Oropharynx without erythema or exudate.  Neck: Supple. No carotid bruits. No thyromegaly. No lymphadenopathy.  Lungs: Clear to auscultation bilaterally, without wheezes, rhonchi or rales. Chest wall non tender to palpitation.  Heart: Regular rate and rhythm without gallops, rubs. II/VI systolic murmur. Abdomen: Soft, non-tender, nondistended, normal bowel sounds, no organomegaly.  Genitalia: deferred.  Rectal: deferred  Extremities: No clubbing, cyanosis or edema. Pulses: 2+ and symmetric all  extremities.  Skin: Warm and dry. Lymph nodes: Cervical, supraclavicular, and axillary nodes normal.  Neurologic: CNII-XII intact. Moves all four extremities.    Assessment/Plan Chest pain Exertional dyspnea Dilated cardiomyopathy. Tobacco use disorder Alcohol use disorder  Right and left heart cath in AM. Refrain from alcohol use. Smoking cessation consult.  Continue ACE inhibitor, B-blocker, HCTZ, Clonidine and Hydralazine.  Add Amlodipine for blood pressure control.  [name] S 06/22/2013, 7:43 PM

## 2013-06-22 NOTE — Care Management Note (Addendum)
    Page 1 of 2   06/24/2013     11:36:29 AM   CARE MANAGEMENT NOTE 06/24/2013  Patient:  [name], [name]   Account Number:  [id number]  Date Initiated:  06/22/2013  Documentation initiated by:  Elissa Hefty  Subjective/Objective Assessment:   adm w htn crisis     Action/Plan:   lives alone, has medicaid that covers birth control only  pt's cell [phone]   Anticipated DC Date:     Anticipated DC Plan:    In-house referral  [place] consult  [place] [facility]      Choice offered to / List presented to:             Status of service:  Completed, signed off Medicare Important Message given?   (If response is "NO", the following Medicare IM given date fields will be blank) Date Medicare IM given:   Date Additional Medicare IM given:    Discharge Disposition:  HOME/SELF CARE  Per UR Regulation:  Reviewed for med. necessity/level of care/duration of stay  If discussed at [place] of Stay Meetings, dates discussed:    Comments:  06-24-13 [place], RN,BSN [phone] Pt states she is not working and will need medication assistance. CM will Utilize the [place] for meds with override for cost of meds. CM did call the [facility] and left them pt's contact information to call the pt. Pt is aware to also call the clinic if they haven't returned call by Monday. Pt's cell number [phone]. No further needs from CM at this time.    2/24 1001 Patricia dowell rn,bsn spoke w [employer]. pt's medicaid only covers birth control. fin co to call pt's dss worker to see if she can qual for [employer] for hosp bill. pt has no pcp or ins for meds. spoke w md who will try and get pt on as many 4.00 meds as possible. pt is agreeable to me sending inform to [place] [facility] to see if pt can get appt and orange card.

## 2013-06-22 NOTE — Progress Notes (Signed)
Chaplain responded to consult to provide AD.  Chaplain dropped off documentation with pt for review.  Chaplain will be available in the future pending any questions or concerns.

## 2013-06-22 NOTE — Progress Notes (Signed)
  Echocardiogram 2D Echocardiogram has been performed.  Mauricio Po 06/22/2013, 11:55 AM

## 2013-06-22 NOTE — Progress Notes (Signed)
[facility] PROGRESS NOTE  [name] L Veenstra OIZ:[id number] DOB: Oct 29, 1975 DOA: 06/21/2013 PCP: No PCP Per Patient  Assessment/Plan: 1. Hypertensive crisis- resolved, BP is now better. Will discontinue the nitropaste and start Metoprolol 25 mg po BID, continue with Hydrochlorothiazide 25 mg po daily and Lisinopril 40 mg po daily. Continue with Catapres prn 2. Pulmonary edema- improved, continue with HCTZ. Echo is pending, BNP is elevated to 1190. Will give one dose of lasix 20 mg IV x 1 3. Constipation- Dulcolax suppository  Once. 4. Financial assistance- She has no health insurance and will require financial assistance for the medications. CSW aware.   Code Status: Full code Family Communication: *No family at bedside Disposition Plan: *Home likely in am   Consultants:  None  Procedures:  None  Antibiotics:    HPI/Subjective: 38 y.o. female h/o HTN and non-compliance with meds. Patient presents to the ED with progressively worsening SOB for the past 2 days. This has been associated with non-productive cough. Dayquil with pseudophedrine did not help symptoms.  Patient reports she has not been on any of her BP meds for the past 6 months secondary to loss of insurance. Review of her chart indicates when she was last seen by [facility] residency back in 2011 she was on norvasc 10, lisinopril-hctz 40-25, and toprol xr 25mg .  Work up in the ED was significant for initial BP of 220/139, not-suprisingly she was found to have pulmonary vascular congestion on CXR  Patient seen and examined, BP is better today  Objective: Filed Vitals:   06/22/13 0733  BP: 148/103  Pulse:   Temp: 98.7 F (37.1 C)  Resp: 17    Intake/Output Summary (Last 24 hours) at 06/22/13 0814 Last data filed at 06/21/13 1900  Gross per 24 hour  Intake    720 ml  Output    350 ml  Net    370 ml   Filed Weights   06/21/13 0101 06/21/13 1530  Weight: 72.576 kg (160 lb) 76.8 kg (169 lb 5 oz)    Exam:       Physical Exam: Head: Normocephalic, atraumatic.  Eyes: No signs of jaundice, EOMI Nose: Mucous membranes dry.  Throat: Oropharynx nonerythematous, no exudate appreciated.  Neck: supple,No deformities, masses, or tenderness noted. Lungs: Bibasilar crackles Heart: Regular RR. S1 and S2 normal  Abdomen: BS normoactive. Soft, Nondistended, non-tender.  Extremities: No pretibial edema, no erythema   Data Reviewed: Basic Metabolic Panel:  Recent Labs Lab 06/21/13 0208 06/21/13 1640 06/22/13 0235  NA 139  --  137  K 4.2  --  3.9  CL 103  --  101  CO2 22  --  23  GLUCOSE 107*  --  85  BUN 12  --  12  CREATININE 0.64 0.84 0.71  CALCIUM 8.3*  --  8.3*   Liver Function Tests:  Recent Labs Lab 06/21/13 0208 06/22/13 0235  AST 110* 38*  ALT 49* 36*  ALKPHOS 85 52  BILITOT 0.4 0.5  PROT 6.9 6.6  ALBUMIN 3.3* 3.2*   No results found for this basename: LIPASE, AMYLASE,  in the last 168 hours No results found for this basename: AMMONIA,  in the last 168 hours CBC:  Recent Labs Lab 06/21/13 0208 06/21/13 1640 06/22/13 0235  WBC 7.9 8.5 7.2  HGB 12.3 12.3 12.4  HCT 36.0 35.9* 36.0  MCV 83.7 83.5 83.3  PLT 170 185 169   Cardiac Enzymes:  Recent Labs Lab 06/21/13 1640 06/21/13 2010  TROPONINI <0.30 <  0.30   BNP (last 3 results)  Recent Labs  06/21/13 0208  PROBNP 1120.0*   CBG: No results found for this basename: GLUCAP,  in the last 168 hours  Recent Results (from the past 240 hour(s))  MRSA PCR SCREENING     Status: None   Collection Time    06/21/13  3:50 PM      Result Value Ref Range Status   MRSA by PCR NEGATIVE  NEGATIVE Final   Comment:            The GeneXpert MRSA Assay (FDA     approved for NASAL specimens     only), is one component of a     comprehensive MRSA colonization     surveillance program. It is not     intended to diagnose MRSA     infection nor to guide or     monitor treatment for     MRSA infections.     Studies: Dg  Chest Portable 1 View  06/21/2013   CLINICAL DATA:  Shortness of breath.  EXAM: PORTABLE CHEST - 1 VIEW  COMPARISON:  None.  FINDINGS: The lungs are well-aerated. Mild bibasilar airspace opacities may reflect mild interstitial edema or possibly mild pneumonia. Vascular congestion is seen. No pleural effusion or pneumothorax is identified.  The cardiomediastinal silhouette is mildly enlarged. No acute osseous abnormalities are seen.  IMPRESSION: Vascular congestion and mild cardiomegaly noted; mild bibasilar airspace opacities may reflect mild interstitial edema or possibly mild pneumonia.   Electronically Signed   By: Garald Balding M.D.   On: 06/21/2013 01:40    Scheduled Meds: . heparin  5,000 Units Subcutaneous 3 times per day  . hydrALAZINE  25 mg Oral 4 times per day  . hydrochlorothiazide  25 mg Oral Daily  . lisinopril  40 mg Oral Daily  . metoprolol tartrate  25 mg Oral BID  . sodium chloride  3 mL Intravenous Q12H   Continuous Infusions:   Principal Problem:   Hypertensive crisis Active Problems:   HYPERTENSION    Time spent: 20 min    [facility] S  [facility] Pager [phone]*. If 7PM-7AM, please contact night-coverage at [email or web address], password [facility] 06/22/2013, 8:14 AM  LOS: 1 day

## 2013-06-22 NOTE — Progress Notes (Signed)
CSW consulted because pt "needs assistance with healthcare and medicines." CSW informed RNCM. CSW signing off.   Ky Barban, MSW, [facility] Clinical Social Worker [phone]

## 2013-06-23 ENCOUNTER — Encounter (HOSPITAL_COMMUNITY)
Admission: EM | Disposition: A | Discharge status: Home / Self Care | Payer: Self-pay | Source: Home / Self Care | Attending: Family Medicine

## 2013-06-23 DIAGNOSIS — I10 Essential (primary) hypertension: Secondary | ICD-10-CM

## 2013-06-23 HISTORY — PX: LEFT AND RIGHT HEART CATHETERIZATION WITH CORONARY ANGIOGRAM: SHX5449

## 2013-06-23 LAB — POCT I-STAT 3, ART BLOOD GAS (G3+)
Acid-base deficit: 2 mmol/L (ref 0.0–2.0)
BICARBONATE: 22.1 meq/L (ref 20.0–24.0)
O2 Saturation: 92 %
PH ART: 7.414 (ref 7.350–7.450)
PO2 ART: 63 mmHg — AB (ref 80.0–100.0)
TCO2: 23 mmol/L (ref 0–100)
pCO2 arterial: 34.6 mmHg — ABNORMAL LOW (ref 35.0–45.0)

## 2013-06-23 LAB — POCT I-STAT 3, VENOUS BLOOD GAS (G3P V)
Acid-base deficit: 3 mmol/L — ABNORMAL HIGH (ref 0.0–2.0)
BICARBONATE: 21.5 meq/L (ref 20.0–24.0)
O2 Saturation: 60 %
TCO2: 23 mmol/L (ref 0–100)
pCO2, Ven: 37.4 mmHg — ABNORMAL LOW (ref 45.0–50.0)
pH, Ven: 7.367 — ABNORMAL HIGH (ref 7.250–7.300)
pO2, Ven: 32 mmHg (ref 30.0–45.0)

## 2013-06-23 LAB — BASIC METABOLIC PANEL
BUN: 17 mg/dL (ref 6–23)
CHLORIDE: 104 meq/L (ref 96–112)
CO2: 22 mEq/L (ref 19–32)
CREATININE: 0.73 mg/dL (ref 0.50–1.10)
Calcium: 8.6 mg/dL (ref 8.4–10.5)
GFR calc non Af Amer: >90 mL/min (ref >90)
Glucose, Bld: 84 mg/dL (ref 70–99)
Potassium: 4.1 mEq/L (ref 3.7–5.3)
Sodium: 139 mEq/L (ref 137–147)

## 2013-06-23 LAB — PROTIME-INR
INR: 0.89 (ref 0.00–1.49)
Prothrombin Time: 11.9 seconds (ref 11.6–15.2)

## 2013-06-23 LAB — CBC
HCT: 36.1 % (ref 36.0–46.0)
Hemoglobin: 12.4 g/dL (ref 12.0–15.0)
MCH: 28.7 pg (ref 26.0–34.0)
MCHC: 34.3 g/dL (ref 30.0–36.0)
MCV: 83.6 fL (ref 78.0–100.0)
Platelets: 174 10*3/uL (ref 150–400)
RBC: 4.32 MIL/uL (ref 3.87–5.11)
RDW: 12.9 % (ref 11.5–15.5)
WBC: 6.5 10*3/uL (ref 4.0–10.5)

## 2013-06-23 LAB — HEPATITIS PANEL, ACUTE
HCV Ab: REACTIVE — AB
HEP A IGM: NONREACTIVE
Hep B C IgM: NONREACTIVE
Hepatitis B Surface Ag: NEGATIVE

## 2013-06-23 LAB — TSH: TSH: 1.016 u[IU]/mL (ref 0.350–4.500)

## 2013-06-23 LAB — PREGNANCY, URINE: PREG TEST UR: NEGATIVE

## 2013-06-23 SURGERY — LEFT AND RIGHT HEART CATHETERIZATION WITH CORONARY ANGIOGRAM
Anesthesia: LOCAL

## 2013-06-23 MED ORDER — HYDRALAZINE HCL 20 MG/ML IJ SOLN
INTRAMUSCULAR | Status: AC
  Filled 2013-06-23: qty 1

## 2013-06-23 MED ORDER — ASPIRIN 81 MG PO CHEW
81.0000 mg | CHEWABLE_TABLET | Freq: Every day | ORAL | Status: DC
  Administered 2013-06-24: 81 mg via ORAL
  Filled 2013-06-23: qty 1

## 2013-06-23 MED ORDER — HYDRALAZINE HCL 50 MG PO TABS
50.0000 mg | ORAL_TABLET | Freq: Three times a day (TID) | ORAL | Status: DC
  Administered 2013-06-23 – 2013-06-24 (×2): 50 mg via ORAL
  Filled 2013-06-23 (×5): qty 1

## 2013-06-23 MED ORDER — MIDAZOLAM HCL 2 MG/2ML IJ SOLN
INTRAMUSCULAR | Status: AC
  Filled 2013-06-23: qty 2

## 2013-06-23 MED ORDER — HEPARIN (PORCINE) IN NACL 2-0.9 UNIT/ML-% IJ SOLN
INTRAMUSCULAR | Status: AC
  Filled 2013-06-23: qty 1000

## 2013-06-23 MED ORDER — LIDOCAINE HCL (PF) 1 % IJ SOLN
INTRAMUSCULAR | Status: AC
  Filled 2013-06-23: qty 30

## 2013-06-23 MED ORDER — ENALAPRILAT 1.25 MG/ML IV SOLN
1.2500 mg | Freq: Once | INTRAVENOUS | Status: DC
  Filled 2013-06-23: qty 1

## 2013-06-23 MED ORDER — NITROGLYCERIN IN D5W 200-5 MCG/ML-% IV SOLN: 2.0000 ug/min | INTRAVENOUS | Status: DC

## 2013-06-23 MED ORDER — ONDANSETRON HCL 4 MG/2ML IJ SOLN
INTRAMUSCULAR | Status: AC
  Filled 2013-06-23: qty 2

## 2013-06-23 MED ORDER — ALPRAZOLAM 0.5 MG PO TABS: 1.0000 mg | ORAL_TABLET | Freq: Two times a day (BID) | ORAL | Status: DC | PRN

## 2013-06-23 MED ORDER — NITROGLYCERIN 0.2 MG/ML ON CALL CATH LAB
INTRAVENOUS | Status: AC
  Filled 2013-06-23: qty 1

## 2013-06-23 MED ORDER — ENALAPRIL MALEATE 10 MG PO TABS
10.0000 mg | ORAL_TABLET | Freq: Every day | ORAL | Status: DC
  Administered 2013-06-23 – 2013-06-24 (×2): 10 mg via ORAL
  Filled 2013-06-23 (×3): qty 1

## 2013-06-23 MED ORDER — ENALAPRILAT 1.25 MG/ML IV SOLN
INTRAVENOUS | Status: AC
  Filled 2013-06-23: qty 1

## 2013-06-23 MED ORDER — FENTANYL CITRATE 0.05 MG/ML IJ SOLN
INTRAMUSCULAR | Status: AC
  Filled 2013-06-23: qty 2

## 2013-06-23 MED ORDER — LABETALOL HCL 5 MG/ML IV SOLN
INTRAVENOUS | Status: AC
  Filled 2013-06-23: qty 4

## 2013-06-23 MED ORDER — ISOSORBIDE MONONITRATE ER 30 MG PO TB24
30.0000 mg | ORAL_TABLET | Freq: Every day | ORAL | Status: DC
  Administered 2013-06-23 – 2013-06-24 (×2): 30 mg via ORAL
  Filled 2013-06-23 (×4): qty 1

## 2013-06-23 MED ORDER — ACETAMINOPHEN 325 MG PO TABS
650.0000 mg | ORAL_TABLET | ORAL | Status: DC | PRN
  Administered 2013-06-23: 650 mg via ORAL
  Filled 2013-06-23: qty 2

## 2013-06-23 NOTE — H&P (View-Only) (Signed)
[name] is an 38 y.o. female.   Chief Complaint: Chest pressure and shortness of breath. HPI: 38 year old female with hypertension and non-compliance with medications has progressively worsening shortness of breath x 3-4 days. Chest x-ray suggestive of vascular congestion and possible edema ( Pro BNP of 1120) or mild pneumonia. Echocardiogram showed dilated and possible ischemic or non-ischemic cardiomyopathy. She also has history of smoking and illicit drug use( Marijuana).   Past Medical History  Diagnosis Date  . Hypertension   . Hepatitis C       Past Surgical History  Procedure Laterality Date  . Liver biopsy      History reviewed. No pertinent family history. Social History:  reports that she has been smoking.  She does not have any smokeless tobacco history on file. She reports that she drinks alcohol. She reports that she uses illicit drugs (Marijuana).  Allergies: No Known Allergies  Medications Prior to Admission  Medication Sig Dispense Refill  . cetirizine (ZYRTEC) 10 MG tablet Take 10 mg by mouth daily.      . diphenhydrAMINE (BENADRYL) 25 MG tablet Take 25 mg by mouth once.      . Pseudoephedrine-APAP-DM (DAYQUIL PO) Take 30 mLs by mouth 2 (two) times daily as needed.      . Pseudoephedrine-Ibuprofen (IBUPROFEN COLD & SINUS PO) Take 1 capsule by mouth once.        Results for orders placed during the hospital encounter of 06/21/13 (from the past 48 hour(s))  APTT     Status: None   Collection Time    06/21/13  2:08 AM      Result Value Ref Range   aPTT 25  24 - 37 seconds  CBC     Status: None   Collection Time    06/21/13  2:08 AM      Result Value Ref Range   WBC 7.9  4.0 - 10.5 K/uL   RBC 4.30  3.87 - 5.11 MIL/uL   Hemoglobin 12.3  12.0 - 15.0 g/dL   HCT 36.0  36.0 - 46.0 %   MCV 83.7  78.0 - 100.0 fL   MCH 28.6  26.0 - 34.0 pg   MCHC 34.2  30.0 - 36.0 g/dL   RDW 12.7  11.5 - 15.5 %   Platelets 170  150 - 400 K/uL  COMPREHENSIVE METABOLIC  PANEL     Status: Abnormal   Collection Time    06/21/13  2:08 AM      Result Value Ref Range   Sodium 139  137 - 147 mEq/L   Potassium 4.2  3.7 - 5.3 mEq/L   Chloride 103  96 - 112 mEq/L   CO2 22  19 - 32 mEq/L   Glucose, Bld 107 (*) 70 - 99 mg/dL   BUN 12  6 - 23 mg/dL   Creatinine, Ser 0.64  0.50 - 1.10 mg/dL   Calcium 8.3 (*) 8.4 - 10.5 mg/dL   Total Protein 6.9  6.0 - 8.3 g/dL   Albumin 3.3 (*) 3.5 - 5.2 g/dL   AST 110 (*) 0 - 37 U/L   ALT 49 (*) 0 - 35 U/L   Alkaline Phosphatase 85  39 - 117 U/L   Total Bilirubin 0.4  0.3 - 1.2 mg/dL   GFR calc non Af Amer >90  >90 mL/min   GFR calc Af Amer >90  >90 mL/min   Comment: (NOTE)     The eGFR has been calculated  using the CKD EPI equation.     This calculation has not been validated in all clinical situations.     eGFR's persistently <90 mL/min signify possible Chronic Kidney     Disease.  PROTIME-INR     Status: None   Collection Time    06/21/13  2:08 AM      Result Value Ref Range   Prothrombin Time 11.9  11.6 - 15.2 seconds   INR 0.89  0.00 - 1.49  PRO B NATRIURETIC PEPTIDE     Status: Abnormal   Collection Time    06/21/13  2:08 AM      Result Value Ref Range   Pro B Natriuretic peptide (BNP) 1120.0 (*) 0 - 125 pg/mL  I-STAT TROPOININ, ED     Status: None   Collection Time    06/21/13  3:27 AM      Result Value Ref Range   Troponin i, poc 0.05  0.00 - 0.08 ng/mL   Comment 3            Comment: Due to the release kinetics of cTnI,     a negative result within the first hours     of the onset of symptoms does not rule out     myocardial infarction with certainty.     If myocardial infarction is still suspected,     repeat the test at appropriate intervals.  Randolm Idol, ED     Status: None   Collection Time    06/21/13  6:21 AM      Result Value Ref Range   Troponin i, poc 0.04  0.00 - 0.08 ng/mL   Comment 3            Comment: Due to the release kinetics of cTnI,     a negative result within the first  hours     of the onset of symptoms does not rule out     myocardial infarction with certainty.     If myocardial infarction is still suspected,     repeat the test at appropriate intervals.  Randolm Idol, ED     Status: None   Collection Time    06/21/13  9:28 AM      Result Value Ref Range   Troponin i, poc 0.03  0.00 - 0.08 ng/mL   Comment 3            Comment: Due to the release kinetics of cTnI,     a negative result within the first hours     of the onset of symptoms does not rule out     myocardial infarction with certainty.     If myocardial infarction is still suspected,     repeat the test at appropriate intervals.  MRSA PCR SCREENING     Status: None   Collection Time    06/21/13  3:50 PM      Result Value Ref Range   MRSA by PCR NEGATIVE  NEGATIVE   Comment:            The GeneXpert MRSA Assay (FDA     approved for NASAL specimens     only), is one component of a     comprehensive MRSA colonization     surveillance program. It is not     intended to diagnose MRSA     infection nor to guide or     monitor treatment for     MRSA infections.  TROPONIN I  Status: None   Collection Time    06/21/13  4:40 PM      Result Value Ref Range   Troponin I <0.30  <0.30 ng/mL   Comment:            Due to the release kinetics of cTnI,     a negative result within the first hours     of the onset of symptoms does not rule out     myocardial infarction with certainty.     If myocardial infarction is still suspected,     repeat the test at appropriate intervals.  CBC     Status: Abnormal   Collection Time    06/21/13  4:40 PM      Result Value Ref Range   WBC 8.5  4.0 - 10.5 K/uL   RBC 4.30  3.87 - 5.11 MIL/uL   Hemoglobin 12.3  12.0 - 15.0 g/dL   HCT 35.9 (*) 36.0 - 46.0 %   MCV 83.5  78.0 - 100.0 fL   MCH 28.6  26.0 - 34.0 pg   MCHC 34.3  30.0 - 36.0 g/dL   RDW 12.5  11.5 - 15.5 %   Platelets 185  150 - 400 K/uL  CREATININE, SERUM     Status: Abnormal    Collection Time    06/21/13  4:40 PM      Result Value Ref Range   Creatinine, Ser 0.84  0.50 - 1.10 mg/dL   GFR calc non Af Amer 87 (*) >90 mL/min   GFR calc Af Amer >90  >90 mL/min   Comment: (NOTE)     The eGFR has been calculated using the CKD EPI equation.     This calculation has not been validated in all clinical situations.     eGFR's persistently <90 mL/min signify possible Chronic Kidney     Disease.  TROPONIN I     Status: None   Collection Time    06/21/13  8:10 PM      Result Value Ref Range   Troponin I <0.30  <0.30 ng/mL   Comment:            Due to the release kinetics of cTnI,     a negative result within the first hours     of the onset of symptoms does not rule out     myocardial infarction with certainty.     If myocardial infarction is still suspected,     repeat the test at appropriate intervals.  CBC     Status: None   Collection Time    06/22/13  2:35 AM      Result Value Ref Range   WBC 7.2  4.0 - 10.5 K/uL   RBC 4.32  3.87 - 5.11 MIL/uL   Hemoglobin 12.4  12.0 - 15.0 g/dL   HCT 36.0  36.0 - 46.0 %   MCV 83.3  78.0 - 100.0 fL   MCH 28.7  26.0 - 34.0 pg   MCHC 34.4  30.0 - 36.0 g/dL   RDW 12.6  11.5 - 15.5 %   Platelets 169  150 - 400 K/uL  COMPREHENSIVE METABOLIC PANEL     Status: Abnormal   Collection Time    06/22/13  2:35 AM      Result Value Ref Range   Sodium 137  137 - 147 mEq/L   Potassium 3.9  3.7 - 5.3 mEq/L   Chloride 101  96 - 112 mEq/L  CO2 23  19 - 32 mEq/L   Glucose, Bld 85  70 - 99 mg/dL   BUN 12  6 - 23 mg/dL   Creatinine, Ser 0.71  0.50 - 1.10 mg/dL   Calcium 8.3 (*) 8.4 - 10.5 mg/dL   Total Protein 6.6  6.0 - 8.3 g/dL   Albumin 3.2 (*) 3.5 - 5.2 g/dL   AST 38 (*) 0 - 37 U/L   ALT 36 (*) 0 - 35 U/L   Alkaline Phosphatase 52  39 - 117 U/L   Total Bilirubin 0.5  0.3 - 1.2 mg/dL   GFR calc non Af Amer >90  >90 mL/min   GFR calc Af Amer >90  >90 mL/min   Comment: (NOTE)     The eGFR has been calculated using the CKD EPI  equation.     This calculation has not been validated in all clinical situations.     eGFR's persistently <90 mL/min signify possible Chronic Kidney     Disease.   Dg Chest Portable 1 View  06/21/2013   CLINICAL DATA:  Shortness of breath.  EXAM: PORTABLE CHEST - 1 VIEW  COMPARISON:  None.  FINDINGS: The lungs are well-aerated. Mild bibasilar airspace opacities may reflect mild interstitial edema or possibly mild pneumonia. Vascular congestion is seen. No pleural effusion or pneumothorax is identified.  The cardiomediastinal silhouette is mildly enlarged. No acute osseous abnormalities are seen.  IMPRESSION: Vascular congestion and mild cardiomegaly noted; mild bibasilar airspace opacities may reflect mild interstitial edema or possibly mild pneumonia.   Electronically Signed   By: Garald Balding M.D.   On: 06/21/2013 01:40    ROS No weight gain or loss, No vision change, No asthma, No COPD, + chest pain, No palpitation, + dyspnea on exertion, No GI or GU bleed, No hepatitis, No kidney stone, No stroke, Seizures or arthritis.  Blood pressure 145/110, pulse 115, temperature 99.1 F (37.3 C), temperature source Oral, resp. rate 20, height _0  (1.575 m), weight 76.8 kg (169 lb 5 oz), last menstrual period 06/02/2013, SpO2 99.00%. General : Alert, oriented, no distress, appears stated age  Head: Normocephalic, atraumatic, Brown eyes, PERL, Conj-pink, Sclera-white. Throat: Moist mucous membranes. Oropharynx without erythema or exudate.  Neck: Supple. No carotid bruits. No thyromegaly. No lymphadenopathy.  Lungs: Clear to auscultation bilaterally, without wheezes, rhonchi or rales. Chest wall non tender to palpitation.  Heart: Regular rate and rhythm without gallops, rubs. II/VI systolic murmur. Abdomen: Soft, non-tender, nondistended, normal bowel sounds, no organomegaly.  Genitalia: deferred.  Rectal: deferred  Extremities: No clubbing, cyanosis or edema. Pulses: 2+ and symmetric all  extremities.  Skin: Warm and dry. Lymph nodes: Cervical, supraclavicular, and axillary nodes normal.  Neurologic: CNII-XII intact. Moves all four extremities.    Assessment/Plan Chest pain Exertional dyspnea Dilated cardiomyopathy. Tobacco use disorder Alcohol use disorder  Right and left heart cath in AM. Refrain from alcohol use. Smoking cessation consult.  Continue ACE inhibitor, B-blocker, HCTZ, Clonidine and Hydralazine.  Add Amlodipine for blood pressure control.  Donivan Thammavong S 06/22/2013, 7:43 PM

## 2013-06-23 NOTE — Interval H&P Note (Signed)
History and Physical Interval Note:  06/23/2013 8:20 AM  [name]  has presented today for surgery, with the diagnosis of cm/chest pressure  The various methods of treatment have been discussed with the patient and family. After consideration of risks, benefits and other options for treatment, the patient has consented to  Procedure(s): LEFT AND RIGHT HEART CATHETERIZATION WITH CORONARY ANGIOGRAM (N/A) as a surgical intervention .  The patient's history has been reviewed, patient examined, no change in status, stable for surgery.  I have reviewed the patient's chart and labs.  Questions were answered to the patient's satisfaction.     Jadesola Poynter S

## 2013-06-23 NOTE — Progress Notes (Signed)
[facility] TEAM 1 - Stepdown/ICU TEAM Progress Note  [name] L Satterly HYQ:[id number] DOB: June 11, 1975 DOA: 06/21/2013 PCP: No PCP Per Patient  Admit HPI / Brief Narrative: 38 y.o. female w/ h/o HTN and non-compliance with meds. Patient presented to the ED with progressively worsening SOB for 2 days associated with non-productive cough. Dayquil with pseudophedrine did not help symptoms.  Patient reported she had not been on any of her BP meds for 6 months secondary to loss of insurance. Review of her chart indicated when she was last seen by the [facility] residency clinic back in 2011 she was on norvasc 10, lisinopril-hctz 40-25, and toprol xr 25mg .   Work up in the ED was significant for initial BP of 220/139, and pulmonary vascular congestion on CXR  HPI/Subjective: Pt is resting comfortably post cath.  She has no complaints at present.  She feels her SOB is improving but not yet back to baseline.  She denies cp, n/v, or abdom pain.    Assessment/Plan:  Hypertensive crisis / malignant HTN Due to noncompliance w/ medications - much improved - no med titration today - follow trend - assure all Rx are from $4 list to improve compliance   Acute Pulmonary edema Due to malignant HTN - much improved w/ BP control, nitrates, and diuresis - cont med tx - f/u CXR in AM - wean O2 as able   Dilated hypertensive cardiomyopathy - newly diagnosed EF 25-30% via TTE w/ hypokinesis of entire myocardium - cath confirms no CAD - recheck EF in outpt setting after BP control established long term   Mild transaminitis  likley hepatic congestion related to acute diastolic/hypertensive CHF + EtOH abuse - recheck in AM - check hepatitis viral panel as precaution   Constipation Improved - follow   Tobacco abuse  Counseled on absolute need to quit  Financial assistance Pt has no pcp or ins for meds - CM is assisting w/ f/u at [facility]   Code Status: FULL Family Communication: no family present  at time of exam Disposition Plan: tranfer to tele bed - cont to diurese and titrate BP meds   Consultants: Cardiology   Procedures: TTE - 2/24 - as noted above  Cardiac cath - 2/25 - normal coronaries   Antibiotics: none  DVT prophylaxis: SQ heparin   Objective: Blood pressure 145/99, pulse 89, temperature 98.6 F (37 C), temperature source Oral, resp. rate 18, height 5\' 2"  (1.575 m), weight 76.023 kg (167 lb 9.6 oz), last menstrual period 06/02/2013, SpO2 98.00%.  Intake/Output Summary (Last 24 hours) at 06/23/13 1406 Last data filed at 06/23/13 1100  Gross per 24 hour  Intake 261.25 ml  Output    200 ml  Net  61.25 ml   Exam: General: No acute respiratory distress when at rest  Lungs: mild bibasilar crackles - no wheeze  Cardiovascular: Regular rate and rhythm without murmur gallop or rub  Abdomen: Nontender, nondistended, soft, bowel sounds positive, no rebound, no ascites, no appreciable mass Extremities: No significant cyanosis, clubbing;  1+ edema bilateral lower extremities  Data Reviewed: Basic Metabolic Panel:  Recent Labs Lab 06/21/13 0208 06/21/13 1640 06/22/13 0235 06/23/13 0316  NA 139  --  137 139  K 4.2  --  3.9 4.1  CL 103  --  101 104  CO2 22  --  23 22  GLUCOSE 107*  --  85 84  BUN 12  --  12 17  CREATININE 0.64 0.84 0.71 0.73  CALCIUM  8.3*  --  8.3* 8.6   Liver Function Tests:  Recent Labs Lab 06/21/13 0208 06/22/13 0235  AST 110* 38*  ALT 49* 36*  ALKPHOS 85 52  BILITOT 0.4 0.5  PROT 6.9 6.6  ALBUMIN 3.3* 3.2*   CBC:  Recent Labs Lab 06/21/13 0208 06/21/13 1640 06/22/13 0235 06/23/13 0316  WBC 7.9 8.5 7.2 6.5  HGB 12.3 12.3 12.4 12.4  HCT 36.0 35.9* 36.0 36.1  MCV 83.7 83.5 83.3 83.6  PLT 170 185 169 174   Cardiac Enzymes:  Recent Labs Lab 06/21/13 1640 06/21/13 2010  TROPONINI <0.30 <0.30   BNP (last 3 results)  Recent Labs  06/21/13 0208  PROBNP 1120.0*     Recent Results (from the past 240 hour(s))    MRSA PCR SCREENING     Status: None   Collection Time    06/21/13  3:50 PM      Result Value Ref Range Status   MRSA by PCR NEGATIVE  NEGATIVE Final   Comment:            The GeneXpert MRSA Assay (FDA     approved for NASAL specimens     only), is one component of a     comprehensive MRSA colonization     surveillance program. It is not     intended to diagnose MRSA     infection nor to guide or     monitor treatment for     MRSA infections.     Studies:  Recent x-ray studies have been reviewed in detail by the Attending Physician  Scheduled Meds:  Scheduled Meds: . amLODipine  5 mg Oral Daily  . [START ON 06/24/2013] aspirin  81 mg Oral Daily  . enalaprilat  1.25 mg Intravenous Once  . heparin  5,000 Units Subcutaneous 3 times per day  . hydrALAZINE  25 mg Oral 4 times per day  . hydrochlorothiazide  25 mg Oral Daily  . lisinopril  40 mg Oral Daily  . metoprolol tartrate  25 mg Oral BID  . sodium chloride  3 mL Intravenous Q12H    Time spent on care of this patient: 35 mins   [place]  [phone] Pager - Text Page per Shea Evans as per below:  On-Call/Text Page:      [email or web address]      password [email or web address]  If 7PM-7AM, please contact night-coverage [email or web address] Password [email or web address] 06/23/2013, 2:06 PM   LOS: 2 days

## 2013-06-23 NOTE — Clinical Documentation Improvement (Signed)
Presents with malignant hypertension, chest pressure, pulmonary edema documented in 2/23 and 2/24 progress notes.   BNP = 1120  Treated with 1 dose IV Lasix 20mg   Please clarify the acuity of the pulmonary edema: acute, chronic, acute on chronic, other condition.  Thank You, Zoila Shutter ,RN Clinical Documentation Specialist:  [phone] : Vernetta Honey  [phone] [place] Information Management

## 2013-06-23 NOTE — CV Procedure (Signed)
PROCEDURE:  Right and Left heart catheterization with selective coronary angiography, left ventriculogram.  CLINICAL HISTORY:  This is a 38 year old female with alcohol, tobacco and illicit drug use disorder had dilated cardiomyopathy on recent echocardiogram and chest pressure with exertional dyspnea..  The risks, benefits, and details of the procedure were explained to the patient.  The patient verbalized understanding and wanted to proceed.  Informed written consent was obtained.  PROCEDURE TECHNIQUE:  The patient was approached from the right femoral artery using a 5 French short sheath and right femoral vein using 7 French short sheath. Right heart pressures and cardiac output were measured using Gordy Councilman catheter.  Left coronary angiography was done using a Judkins L4 guide catheter.  Right coronary angiography was done using a Judkins R4 guide catheter.  Left ventriculography was done using a pigtail catheter.    CONTRAST:  Total of 70 cc.  COMPLICATIONS:  None.  At the end of the procedure a manual pressure was used for hemostasis.    HEMODYNAMICS:  Aortic pressure was 151/108; LV pressure was 147/15; LVEDP 28.  There was no gradient between the left ventricle and aorta.  PA was 43/10. Wedge was 17. RV was 42/8 and RA was 7. Cardiac output was 4.64 by thermal and 4.65 L/min by Fick method. Oxygen saturation was 60 % in PA and 90 % in AO.  ANGIOGRAM/CORONARY ARTERIOGRAM:   The left main coronary artery is short.  The left anterior descending artery is unremarkable. Diagonal one has high origin and unremarkable.  The left circumflex artery is unremarkable.  The right coronary artery is dominant and unremarkable.  LEFT VENTRICULOGRAM:  Left ventricular angiogram was done in the 30 RAO projection and revealed dilated left ventricle with generalized moderate to severe hypokinesia and severe systolic dysfunction with an estimated ejection fraction of 25-30 %. Similar finding by  echocardiogram.  LVEDP was 28 mmHg.  IMPRESSION OF HEART CATHETERIZATION:   1. Normal left main coronary artery. 2. Normal left anterior descending artery and its branches. 3. Normal left circumflex artery and its branches. 4. Normal right coronary artery. 5. Dilated LV with severe left ventricular systolic dysfunction.  LVEDP 28 mmHg.  Ejection fraction 25-30 %. 6.  Mild elevation of PA systolic pressure.   RECOMMENDATION:   Medical treatment with life-style modification (No smoking, alcohol or illicit drug use, taking blood pressure medications regularly and heart healthy diet) and recheck LV function in 2-3 months.

## 2013-06-24 ENCOUNTER — Inpatient Hospital Stay (HOSPITAL_COMMUNITY): Payer: Medicaid Other

## 2013-06-24 DIAGNOSIS — G4700 Insomnia, unspecified: Secondary | ICD-10-CM

## 2013-06-24 DIAGNOSIS — F17200 Nicotine dependence, unspecified, uncomplicated: Secondary | ICD-10-CM

## 2013-06-24 LAB — COMPREHENSIVE METABOLIC PANEL
ALK PHOS: 50 U/L (ref 39–117)
ALT: 42 U/L — AB (ref 0–35)
AST: 47 U/L — ABNORMAL HIGH (ref 0–37)
Albumin: 3.2 g/dL — ABNORMAL LOW (ref 3.5–5.2)
BUN: 12 mg/dL (ref 6–23)
CHLORIDE: 102 meq/L (ref 96–112)
CO2: 23 mEq/L (ref 19–32)
Calcium: 8.9 mg/dL (ref 8.4–10.5)
Creatinine, Ser: 0.6 mg/dL (ref 0.50–1.10)
GFR calc non Af Amer: >90 mL/min (ref >90)
GLUCOSE: 94 mg/dL (ref 70–99)
POTASSIUM: 4.1 meq/L (ref 3.7–5.3)
SODIUM: 139 meq/L (ref 137–147)
TOTAL PROTEIN: 6.8 g/dL (ref 6.0–8.3)
Total Bilirubin: 0.5 mg/dL (ref 0.3–1.2)

## 2013-06-24 MED ORDER — ISOSORBIDE MONONITRATE ER 30 MG PO TB24: 30.0000 mg | ORAL_TABLET | Freq: Every day | ORAL | Status: DC

## 2013-06-24 MED ORDER — ENALAPRIL MALEATE 10 MG PO TABS: 10.0000 mg | ORAL_TABLET | Freq: Every day | ORAL | Status: DC

## 2013-06-24 MED ORDER — METOPROLOL TARTRATE 25 MG PO TABS: 25.0000 mg | ORAL_TABLET | Freq: Two times a day (BID) | ORAL | Status: DC

## 2013-06-24 MED ORDER — HYDRALAZINE HCL 50 MG PO TABS: 50.0000 mg | ORAL_TABLET | Freq: Three times a day (TID) | ORAL | Status: DC

## 2013-06-24 MED ORDER — HYDROCHLOROTHIAZIDE 25 MG PO TABS: 25.0000 mg | ORAL_TABLET | Freq: Every day | ORAL | Status: DC

## 2013-06-24 MED ORDER — ASPIRIN 81 MG PO CHEW: 81.0000 mg | CHEWABLE_TABLET | Freq: Every day | ORAL | Status: DC

## 2013-06-24 NOTE — Discharge Instructions (Signed)

## 2013-06-24 NOTE — Discharge Summary (Signed)
Physician Discharge Summary  Patient ID: [name] MRN: [id number] DOB/AGE: February 21, 1976 38 y.o.  Admit date: 06/21/2013 Discharge date: 06/24/2013  Primary Care Physician:  No PCP Per Patient  Discharge Diagnoses:     . Hypertensive crisis/malignant HTN . Dilated hypertensive cardiomyopathy- newly diagnosed   Acute pulmonary edema   Mild transminitis   Consults: Cardiology, Dr Doylene Canard  Recommendations for Outpatient Follow-up:  Please follow CBC, BMET at appointment, will likely need repeat echo in 3-6 months   Allergies:  No Known Allergies   Discharge Medications:   Medication List         aspirin 81 MG chewable tablet  Chew 1 tablet (81 mg total) by mouth daily.     cetirizine 10 MG tablet  Commonly known as:  ZYRTEC  Take 10 mg by mouth daily.     DAYQUIL PO  Take 30 mLs by mouth 2 (two) times daily as needed.     diphenhydrAMINE 25 MG tablet  Commonly known as:  BENADRYL  Take 25 mg by mouth once.     enalapril 10 MG tablet  Commonly known as:  VASOTEC  Take 1 tablet (10 mg total) by mouth daily.     hydrALAZINE 50 MG tablet  Commonly known as:  APRESOLINE  Take 1 tablet (50 mg total) by mouth 3 (three) times daily.     hydrochlorothiazide 25 MG tablet  Commonly known as:  HYDRODIURIL  Take 1 tablet (25 mg total) by mouth daily.     IBUPROFEN COLD & SINUS PO  Take 1 capsule by mouth once.     isosorbide mononitrate 30 MG 24 hr tablet  Commonly known as:  IMDUR  Take 1 tablet (30 mg total) by mouth daily.     metoprolol tartrate 25 MG tablet  Commonly known as:  LOPRESSOR  Take 1 tablet (25 mg total) by mouth 2 (two) times daily.         Brief H and P: For complete details please refer to admission H and P, but in brief [name] is a 38 y.o. female h/o HTN and non-compliance with meds. Patient presented to the ED with progressively worsening SOB for the past 2 days prior to admission. This had been associated with  non-productive cough. Dayquil with pseudophedrine did not help symptoms.  Patient reported she has not been on any of her BP meds for the past 6 months secondary to loss of insurance. Review of her chart indicates when she was last seen by [facility] residency back in 2011 she was on norvasc 10, lisinopril-hctz 40-25, and toprol xr 25mg .  Work up in the ED was significant for initial BP of 220/139, not-suprisingly she was found to have pulmonary vascular congestion on CXR  Hospital Course:   38 y.o. female w/ h/o HTN and non-compliance with meds. Patient presented to the ED with progressively worsening SOB for 2 days associated with non-productive cough. Dayquil with pseudophedrine did not help symptoms. Patient reported she had not been on any of her BP meds for 6 months secondary to loss of insurance. Review of her chart indicated when she was last seen by the [facility] residency clinic back in 2011 she was on norvasc 10, lisinopril-hctz 40-25, and toprol xr 25mg .  Work up in the ED was significant for initial BP of 220/139, and pulmonary vascular congestion on CXR Hypertensive crisis / malignant HTN  Due to noncompliance w/ medications - much improved   Acute Pulmonary edema  Due to  malignant HTN - much improved w/ BP control, nitrates, and diuresis - cont med tx Chest Xray 2/26 showed improved pulmonary edema.  Dilated hypertensive cardiomyopathy - newly diagnosed  EF 25-30% via TTE w/ hypokinesis of entire myocardium. Cardiology was consulted. Patient underwent cardiac cath which confirmed no CAD - recheck EF in outpt setting after BP control established long term.   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   Constipation  Resolved  Tobacco abuse  Counseled on absolute need to quit   Financial assistance  Pt has no pcp or ins for meds, patient will follow up at [facility]    Day of Discharge BP 142/95  Pulse 76  Temp(Src) 98 F (36.7 C) (Oral)  Resp 18  Ht 5\' 2"  (1.575 m)  Wt 76.023 kg (167 lb 9.6 oz)  BMI 30.65 kg/m2  SpO2 98%  LMP 06/02/2013  Physical Exam: General: Alert and awake oriented x3 not in any acute distress. CVS: S1-S2 clear no murmur rubs or gallops Chest: clear to auscultation bilaterally, no wheezing rales or rhonchi Abdomen: soft nontender, nondistended, normal bowel sounds Extremities: no cyanosis, clubbing or edema noted bilaterally Neuro: Cranial nerves II-XII intact, no focal neurological deficits   The results of significant diagnostics from this hospitalization (including imaging, microbiology, ancillary and laboratory) are listed below for reference.    LAB RESULTS: Basic Metabolic Panel:  Recent Labs Lab 06/23/13 0316 06/24/13 0349  NA 139 139  K 4.1 4.1  CL 104 102  CO2 22 23  GLUCOSE 84 94  BUN 17 12  CREATININE 0.73 0.60  CALCIUM 8.6 8.9   Liver Function Tests:  Recent Labs Lab 06/22/13 0235 06/24/13 0349  AST 38* 47*  ALT 36* 42*  ALKPHOS 52 50  BILITOT 0.5 0.5  PROT 6.6 6.8  ALBUMIN 3.2* 3.2*   No results found for this basename: LIPASE, AMYLASE,  in the last 168 hours No results found for this basename: AMMONIA,  in the last 168 hours CBC:  Recent Labs Lab 06/22/13 0235 06/23/13 0316  WBC 7.2 6.5  HGB 12.4 12.4  HCT 36.0 36.1  MCV 83.3 83.6  PLT 169 174   Cardiac Enzymes:  Recent Labs Lab 06/21/13 1640 06/21/13 2010  TROPONINI <0.30 <0.30   BNP: No components found with this basename: POCBNP,  CBG: No results found for this basename: GLUCAP,  in the last 168 hours  Significant Diagnostic Studies:  Dg Chest Portable 1 View  06/21/2013   CLINICAL DATA:  Shortness of breath.  EXAM: PORTABLE CHEST - 1 VIEW  COMPARISON:  None.  FINDINGS: The lungs are well-aerated. Mild bibasilar airspace opacities may reflect mild interstitial edema or possibly mild  pneumonia. Vascular congestion is seen. No pleural effusion or pneumothorax is identified.  The cardiomediastinal silhouette is mildly enlarged. No acute osseous abnormalities are seen.  IMPRESSION: Vascular congestion and mild cardiomegaly noted; mild bibasilar airspace opacities may reflect mild interstitial edema or possibly mild pneumonia.   Electronically Signed   By: Roanna Raider M.D.   On: 06/21/2013 01:40    2D ECHO: Study Conclusions  - Left ventricle: The cavity size was mildly dilated. Systolic function was severely reduced. The estimated ejection fraction was in the range of 25% to 30%. There is moderate hypokinesis of the entire myocardium. Doppler parameters are consistent with abnormal left ventricular relaxation (grade 1 diastolic dysfunction). -  Aortic valve: Mild regurgitation. - Mitral valve: Moderate regurgitation. - Left atrium: The atrium was mildly dilated. - Pericardium, extracardiac: A trivial pericardial effusion was identified.    Disposition and Follow-up:     Discharge Orders   Future Orders Complete By Expires   Diet - low sodium heart healthy  As directed    Increase activity slowly  As directed        DISPOSITION: home  DIET: heart healthy    DISCHARGE FOLLOW-UP Follow-up Information   Follow up with [facility] S, MD. Schedule an appointment as soon as possible for a visit in 1 week. (for hospital follow-up)    Specialty:  Cardiology   Contact information:   [place] [address] [phone]       Follow up with [facility]    . Schedule an appointment as soon as possible for a visit in 2 weeks. (for hospital follow-up)    Contact information:   [place] [phone]      Time spent on Discharge: 40 mins  Signed:   RAI,RIPUDEEP M.D. [facility] 06/24/2013, 3:43 PM Pager: [phone]

## 2013-06-24 NOTE — Consult Note (Addendum)
Subjective:  Feeling better. No right groin pain, swelling or discharge. Afebrile. Improved blood pressure control.   Objective:  Vital Signs in the last 24 hours: Temp:  [98 F (36.7 C)-98.6 F (37 C)] 98 F (36.7 C) (02/26 0536) Pulse Rate:  [76-91] 76 (02/26 0536) Cardiac Rhythm:  [-] Normal sinus rhythm (02/26 0733) Resp:  [17-18] 18 (02/26 0536) BP: (142-148)/(95-115) 142/95 mmHg (02/26 0536) SpO2:  [98 %-100 %] 98 % (02/26 0536)  Physical Exam: BP Readings from Last 1 Encounters:  06/24/13 142/95     Wt Readings from Last 1 Encounters:  06/22/13 76.023 kg (167 lb 9.6 oz)    Weight change:   HEENT: /AT, Eyes-Brown, PERL, EOMI, Conjunctiva-Pink, Sclera-Non-icteric Neck: No JVD, No bruit, Trachea midline. Lungs:  Clear, Bilateral. Cardiac:  Regular rhythm, normal S1 and S2, no S3.  Abdomen:  Soft, non-tender. Extremities:  No edema present. No cyanosis. No clubbing. CNS: AxOx3, Cranial nerves grossly intact, moves all 4 extremities. Right handed. Skin: Warm and dry.   Intake/Output from previous day: 02/25 0701 - 02/26 0700 In: -  Out: 700 [Urine:700]    Lab Results: BMET    Component Value Date/Time   NA 139 06/24/2013 0349   K 4.1 06/24/2013 0349   CL 102 06/24/2013 0349   CO2 23 06/24/2013 0349   GLUCOSE 94 06/24/2013 0349   BUN 12 06/24/2013 0349   CREATININE 0.60 06/24/2013 0349   CALCIUM 8.9 06/24/2013 0349   GFRNONAA >90 06/24/2013 0349   GFRAA >90 06/24/2013 0349   CBC    Component Value Date/Time   WBC 6.5 06/23/2013 0316   RBC 4.32 06/23/2013 0316   HGB 12.4 06/23/2013 0316   HCT 36.1 06/23/2013 0316   PLT 174 06/23/2013 0316   MCV 83.6 06/23/2013 0316   MCH 28.7 06/23/2013 0316   MCHC 34.3 06/23/2013 0316   RDW 12.9 06/23/2013 0316   CARDIAC ENZYMES Lab Results  Component Value Date   TROPONINI <0.30 06/21/2013    Scheduled Meds: . aspirin  81 mg Oral Daily  . enalapril  10 mg Oral Daily  . heparin  5,000 Units Subcutaneous 3 times per day  .  hydrALAZINE  50 mg Oral 3 times per day  . hydrochlorothiazide  25 mg Oral Daily  . isosorbide mononitrate  30 mg Oral Daily  . metoprolol tartrate  25 mg Oral BID  . sodium chloride  3 mL Intravenous Q12H   Continuous Infusions:  PRN Meds:.acetaminophen, ALPRAZolam, cloNIDine, ketorolac, morphine injection, ondansetron (ZOFRAN) IV  Assessment/Plan: Acute left heart systolic failure Uncontrolled hypertension- improved Non-ischemic dilated cardiomyopathy Alcoholic cardiomyopathy Chest pain  Exertional dyspnea   Tobacco use disorder  Alcohol use disorder  F/U in 1 week post discharge.   LOS: 3 days    Dixie Dials  MD  06/24/2013, 8:03 AM

## 2013-07-13 ENCOUNTER — Ambulatory Visit: Payer: Medicaid Other | Attending: Internal Medicine

## 2013-07-13 ENCOUNTER — Ambulatory Visit: Payer: Medicaid Other | Attending: Internal Medicine | Admitting: Internal Medicine

## 2013-07-13 ENCOUNTER — Encounter: Payer: Self-pay | Admitting: Internal Medicine

## 2013-07-13 VITALS — BP 162/102 | HR 56 | Temp 98.5°F | Resp 16 | Ht 62.0 in | Wt 169.0 lb

## 2013-07-13 DIAGNOSIS — Z8249 Family history of ischemic heart disease and other diseases of the circulatory system: Secondary | ICD-10-CM | POA: Insufficient documentation

## 2013-07-13 DIAGNOSIS — Z79899 Other long term (current) drug therapy: Secondary | ICD-10-CM | POA: Insufficient documentation

## 2013-07-13 DIAGNOSIS — B1920 Unspecified viral hepatitis C without hepatic coma: Secondary | ICD-10-CM | POA: Insufficient documentation

## 2013-07-13 DIAGNOSIS — Z833 Family history of diabetes mellitus: Secondary | ICD-10-CM | POA: Insufficient documentation

## 2013-07-13 DIAGNOSIS — Z008 Encounter for other general examination: Secondary | ICD-10-CM | POA: Insufficient documentation

## 2013-07-13 DIAGNOSIS — I10 Essential (primary) hypertension: Secondary | ICD-10-CM | POA: Insufficient documentation

## 2013-07-13 DIAGNOSIS — Z7982 Long term (current) use of aspirin: Secondary | ICD-10-CM | POA: Insufficient documentation

## 2013-07-13 DIAGNOSIS — F17200 Nicotine dependence, unspecified, uncomplicated: Secondary | ICD-10-CM | POA: Insufficient documentation

## 2013-07-13 MED ORDER — CLONIDINE HCL 0.1 MG PO TABS
0.1000 mg | ORAL_TABLET | Freq: Once | ORAL | Status: AC
Start: 2013-07-13 — End: 2013-07-13
  Administered 2013-07-13: 0.1 mg via ORAL

## 2013-07-13 MED ORDER — ISOSORBIDE MONONITRATE ER 60 MG PO TB24: 60.0000 mg | ORAL_TABLET | Freq: Every day | ORAL | Status: DC

## 2013-07-13 MED ORDER — BUPROPION HCL 75 MG PO TABS: 75.0000 mg | ORAL_TABLET | Freq: Two times a day (BID) | ORAL | Status: DC

## 2013-07-13 NOTE — Progress Notes (Signed)
Pt is here to establish care. Pt has a history of HTN.  

## 2013-07-13 NOTE — Progress Notes (Signed)
Patient ID: [name], female   DOB: Sep 22, 1975, 38 y.o.   MRN: [id number]   [name], is a 38 y.o. female  GDJ:[id number]  QQI:[id number]  DOB - 1976-04-19  CC:  Chief Complaint  Patient presents with  . Establish Care       HPI: [name] is a 38 y.o. female here today to establish medical care. Patient was in her usual state of health until recently when she presented to the ED with hypertensive emergency, she was also found to be positive for hepatitis C with elevated liver enzymes. She was managed appropriately and discharged to be followed up in our clinic. She is here today to establish care. Her blood pressure today is 162/102 mmHg, she has no complaint, no headache no chest pain. Her medications include enalapril 10 mg tablet by mouth daily, hydralazine 50 mg tablet by mouth 3 times a day, isosorbide mononitrate 30 mg tablet every 24 hours and metoprolol 25 mg tablet by mouth twice a day as well as hydrochlorothiazide 25 mg tablet by mouth daily. She claims compliant with medications and she reports no side effects so far. She smokes heavily about one pack of cigarettes per day but she's trying to quit. She drinks alcohol occasionally. Patient has No headache, No chest pain, No abdominal pain - No Nausea, No new weakness tingling or numbness, No Cough - SOB.  No Known Allergies Past Medical History  Diagnosis Date  . Hypertension   . Hepatitis C    Current Outpatient Prescriptions on File Prior to Visit  Medication Sig Dispense Refill  . aspirin 81 MG chewable tablet Chew 1 tablet (81 mg total) by mouth daily.  30 tablet  5  . enalapril (VASOTEC) 10 MG tablet Take 1 tablet (10 mg total) by mouth daily.  30 tablet  5  . hydrALAZINE (APRESOLINE) 50 MG tablet Take 1 tablet (50 mg total) by mouth 3 (three) times daily.  90 tablet  3  . hydrochlorothiazide (HYDRODIURIL) 25 MG tablet Take 1 tablet (25 mg total) by mouth daily.  30 tablet  3  . metoprolol tartrate  (LOPRESSOR) 25 MG tablet Take 1 tablet (25 mg total) by mouth 2 (two) times daily.  60 tablet  3  . cetirizine (ZYRTEC) 10 MG tablet Take 10 mg by mouth daily.      . diphenhydrAMINE (BENADRYL) 25 MG tablet Take 25 mg by mouth once.      . Pseudoephedrine-APAP-DM (DAYQUIL PO) Take 30 mLs by mouth 2 (two) times daily as needed.      . Pseudoephedrine-Ibuprofen (IBUPROFEN COLD & SINUS PO) Take 1 capsule by mouth once.       No current facility-administered medications on file prior to visit.   Family History  Problem Relation Age of Onset  . Hypertension Mother   . Hyperlipidemia Mother   . Diabetes Maternal Aunt    History   Social History  . Marital Status: Single    Spouse Name: N/A    Number of Children: N/A  . Years of Education: N/A   Occupational History  . Not on file.   Social History Main Topics  . Smoking status: Current Every Day Smoker  . Smokeless tobacco: Not on file  . Alcohol Use: Yes  . Drug Use: Yes    Special: Marijuana  . Sexual Activity: Not on file   Other Topics Concern  . Not on file   Social History Narrative  . No narrative on file  Review of Systems: Constitutional: Negative for fever, chills, diaphoresis, activity change, appetite change and fatigue. HENT: Negative for ear pain, nosebleeds, congestion, facial swelling, rhinorrhea, neck pain, neck stiffness and ear discharge.  Eyes: Negative for pain, discharge, redness, itching and visual disturbance. Respiratory: Negative for cough, choking, chest tightness, shortness of breath, wheezing and stridor.  Cardiovascular: Negative for chest pain, palpitations and leg swelling. Gastrointestinal: Negative for abdominal distention. Genitourinary: Negative for dysuria, urgency, frequency, hematuria, flank pain, decreased urine volume, difficulty urinating and dyspareunia.  Musculoskeletal: Negative for back pain, joint swelling, arthralgia and gait problem. Neurological: Negative for dizziness,  tremors, seizures, syncope, facial asymmetry, speech difficulty, weakness, light-headedness, numbness and headaches.  Hematological: Negative for adenopathy. Does not bruise/bleed easily. Psychiatric/Behavioral: Negative for hallucinations, behavioral problems, confusion, dysphoric mood, decreased concentration and agitation.    Objective:   Filed Vitals:   07/13/13 0953  BP: 162/102  Pulse: 56  Temp: 98.5 F (36.9 C)  Resp: 16    Physical Exam: Constitutional: Patient appears well-developed and well-nourished. No distress. HENT: Normocephalic, atraumatic, External right and left ear normal. Oropharynx is clear and moist.  Eyes: Conjunctivae and EOM are normal. PERRLA, no scleral icterus. Neck: Normal ROM. Neck supple. No JVD. No tracheal deviation. No thyromegaly. CVS: RRR, S1/S2 +, no murmurs, no gallops, no carotid bruit.  Pulmonary: Effort and breath sounds normal, no stridor, rhonchi, wheezes, rales.  Abdominal: Soft. BS +, no distension, tenderness, rebound or guarding.  Musculoskeletal: Normal range of motion. No edema and no tenderness.  Lymphadenopathy: No lymphadenopathy noted, cervical, inguinal or axillary Neuro: Alert. Normal reflexes, muscle tone coordination. No cranial nerve deficit. Skin: Skin is warm and dry. No rash noted. Not diaphoretic. No erythema. No pallor. Psychiatric: Normal mood and affect. Behavior, judgment, thought content normal.  Lab Results  Component Value Date   WBC 6.5 06/23/2013   HGB 12.4 06/23/2013   HCT 36.1 06/23/2013   MCV 83.6 06/23/2013   PLT 174 06/23/2013   Lab Results  Component Value Date   CREATININE 0.60 06/24/2013   BUN 12 06/24/2013   NA 139 06/24/2013   K 4.1 06/24/2013   CL 102 06/24/2013   CO2 23 06/24/2013    No results found for this basename: HGBA1C   Lipid Panel     Component Value Date/Time   CHOL 148 10/20/2009 2054   TRIG 67 10/20/2009 2054   HDL 64 10/20/2009 2054   CHOLHDL 2.3 Ratio 10/20/2009 2054   VLDL 13  10/20/2009 2054   LDLCALC 71 10/20/2009 2054       Assessment and plan:   1. HTN (hypertension) Continue hydralazine 50 mg tablet by mouth 3 times a day Continue hydrochlorothiazide 25 mg tablet by mouth daily Continue metoprolol 25 mg tablet by mouth twice a day Increase- isosorbide mononitrate (IMDUR) to 60 MG 24 hr tablet; Take 1 tablet (60 mg total) by mouth daily.  Dispense: 90 tablet; Refill: 3  - cloNIDine (CATAPRES) tablet 0.1 mg; Take 1 tablet (0.1 mg total) by mouth once.   2. Smoking addiction Patient was extensively counseled on smoking cessation She had been on Wellbutrin in the past for smoking cessation, she would like to restart - buPROPion (WELLBUTRIN) 75 MG tablet; Take 1 tablet (75 mg total) by mouth 2 (two) times daily.  Dispense: 60 tablet; Refill: 3  Patient was counseled extensively about nutrition and exercise  Return in about 2 weeks (around 07/27/2013), or if symptoms worsen or fail to improve, for BP Check, Nurse Visit.  The patient was given clear instructions to go to ER or return to medical center if symptoms don't improve, worsen or new problems develop. The patient verbalized understanding. The patient was told to call to get lab results if they haven't heard anything in the next week.     This note has been created with Surveyor, quantity. Any transcriptional errors are unintentional.    Angelica Chessman, MD, [place], [place], [place], [place]   07/13/2013, 10:54 AM

## 2013-07-13 NOTE — Patient Instructions (Signed)

## 2013-07-19 ENCOUNTER — Telehealth: Payer: Self-pay | Admitting: Internal Medicine

## 2013-07-19 NOTE — Telephone Encounter (Signed)
Left message for pt to call clinic

## 2013-07-19 NOTE — Telephone Encounter (Signed)
Pt called regarding on of her medication, pt states that she has question about her medication. Please contact pt

## 2013-07-20 ENCOUNTER — Telehealth: Payer: Self-pay | Admitting: Internal Medicine

## 2013-07-20 MED ORDER — BUPROPION HCL 75 MG PO TABS: 75.0000 mg | ORAL_TABLET | Freq: Two times a day (BID) | ORAL | Status: DC

## 2013-07-20 NOTE — Addendum Note (Signed)
Addended by: Allyson Sabal MD, [facility] Dike on: 07/20/2013 04:00 PM   Modules accepted: Orders

## 2013-07-20 NOTE — Telephone Encounter (Signed)
Pt says Welbutrin is causing her to have hallucinations and would like script changed to regular Welbutrin which she was taking before and had never had this type of reaction. Pt uses [facility].

## 2013-07-21 ENCOUNTER — Telehealth: Payer: Self-pay | Admitting: *Deleted

## 2013-07-21 NOTE — Telephone Encounter (Signed)
Pt called having side effects from the ER Wellbutrin. She did fine on the IR so the Dr. discontinued the ER and wrote for the IR Wellbutrin.

## 2013-07-21 NOTE — Telephone Encounter (Signed)
Patient requesting new prescription for regular Wellbutrin Can we prescribe this for her

## 2013-07-23 ENCOUNTER — Telehealth: Payer: Self-pay | Admitting: Emergency Medicine

## 2013-07-27 ENCOUNTER — Ambulatory Visit: Payer: Medicaid Other | Attending: Internal Medicine

## 2013-07-27 NOTE — Progress Notes (Unsigned)
Subjective:     Patient ID: [name], female   DOB: Dec 11, 1975, 38 y.o.   MRN: [id number]  HPI   Review of Systems     Objective:   Physical Exam     Assessment:     ***    Plan:     ***     Pt comes in for repeat blood pressure recheck after Isosorbide mononitrate increased to 60 mg Pt states she is waiting on monitor in the mail BP today 123/74 77 Pt instructed to keep taking medication daily/follow DASH diet and return with bp log with next appointment

## 2013-08-27 ENCOUNTER — Telehealth: Payer: Self-pay | Admitting: *Deleted

## 2013-08-27 NOTE — Telephone Encounter (Signed)
Patient calling in regards to her blood pressure medication. Patient states she is on four medications for blood pressure and noticed they do not have any refills. Patient wanted to know if that meant she needed a follow up appointment. Patient states she just filled them and only has enough to last her one month. Patient was last seen March 2015. Patient scheduled for follow up appointment on Tuesday [id number] at 10:15 AM. Patient verified and confirmed appointment. Alverda Skeans, RN

## 2013-08-31 ENCOUNTER — Ambulatory Visit: Payer: Medicaid Other | Admitting: Internal Medicine

## 2013-09-16 ENCOUNTER — Telehealth: Payer: Self-pay | Admitting: Emergency Medicine

## 2013-09-16 ENCOUNTER — Telehealth: Payer: Self-pay | Admitting: Internal Medicine

## 2013-09-16 MED ORDER — METOPROLOL TARTRATE 25 MG PO TABS: 25.0000 mg | ORAL_TABLET | Freq: Two times a day (BID) | ORAL | Status: DC

## 2013-09-16 MED ORDER — HYDRALAZINE HCL 50 MG PO TABS
50.0000 mg | ORAL_TABLET | Freq: Three times a day (TID) | ORAL | Status: DC
Start: 2013-09-16 — End: 2013-12-29

## 2013-09-16 MED ORDER — HYDROCHLOROTHIAZIDE 25 MG PO TABS: 25.0000 mg | ORAL_TABLET | Freq: Every day | ORAL | Status: DC

## 2013-09-16 NOTE — Telephone Encounter (Signed)
Both number listed invalid . Pt called in requesting medication refill for BP until seen by provider. Medication refilled  Hydralazine,Metotoprolol and HCTZ and e-scribed to [facility]

## 2013-09-16 NOTE — Telephone Encounter (Signed)
Pt has f/u appt for HTN on 09/28/13 but will run out of meds before then. Please f/u with pt.

## 2013-09-28 ENCOUNTER — Ambulatory Visit: Payer: Medicaid Other | Attending: Internal Medicine | Admitting: Internal Medicine

## 2013-09-28 ENCOUNTER — Encounter: Payer: Self-pay | Admitting: Internal Medicine

## 2013-09-28 VITALS — BP 111/77 | HR 92 | Temp 99.1°F | Resp 16 | Ht 62.0 in | Wt 164.0 lb

## 2013-09-28 DIAGNOSIS — I10 Essential (primary) hypertension: Secondary | ICD-10-CM | POA: Diagnosis not present

## 2013-09-28 DIAGNOSIS — M79643 Pain in unspecified hand: Secondary | ICD-10-CM

## 2013-09-28 DIAGNOSIS — K5900 Constipation, unspecified: Secondary | ICD-10-CM | POA: Diagnosis not present

## 2013-09-28 DIAGNOSIS — F17200 Nicotine dependence, unspecified, uncomplicated: Secondary | ICD-10-CM | POA: Insufficient documentation

## 2013-09-28 DIAGNOSIS — B1920 Unspecified viral hepatitis C without hepatic coma: Secondary | ICD-10-CM | POA: Insufficient documentation

## 2013-09-28 DIAGNOSIS — Z7982 Long term (current) use of aspirin: Secondary | ICD-10-CM | POA: Insufficient documentation

## 2013-09-28 DIAGNOSIS — M79609 Pain in unspecified limb: Secondary | ICD-10-CM | POA: Diagnosis not present

## 2013-09-28 MED ORDER — BUPROPION HCL 75 MG PO TABS: 75.0000 mg | ORAL_TABLET | Freq: Every day | ORAL | Status: DC

## 2013-09-28 MED ORDER — POLYETHYLENE GLYCOL 3350 17 GM/SCOOP PO POWD: 17.0000 g | Freq: Every day | ORAL | Status: DC

## 2013-09-28 MED ORDER — GABAPENTIN 100 MG PO CAPS: 100.0000 mg | ORAL_CAPSULE | Freq: Every day | ORAL | Status: DC

## 2013-09-28 NOTE — Progress Notes (Signed)
Pt is here following up on her HTN. Pt states that she is out of medications. Pt reports that she is having tingling and numbness in her right hand w/ pain causing her to drop things.

## 2013-09-28 NOTE — Progress Notes (Signed)
Patient ID: [name], female   DOB: [birth year], 38 y.o.   MRN: [id number]  CC: f/u  HPI:  Patient presents to clinic for a follow up for HTN.  She reports that she has tried to quit smoking since last visit but has been unsuccessful on her own. She states that she never got the prescription of Wellbutrin. Home BP readings of 885 systolically. Has occasional diastolic readings of up to 100.  Patient reports medication compliance.  Patient reports that she has not began exercising or maintaining a health diet.  Reports that she does more baked foods but eats bags of potato chips weekly.    No Known Allergies Past Medical History  Diagnosis Date  . Hypertension   . Hepatitis C    Current Outpatient Prescriptions on File Prior to Visit  Medication Sig Dispense Refill  . aspirin 81 MG chewable tablet Chew 1 tablet (81 mg total) by mouth daily.  30 tablet  5  . hydrALAZINE (APRESOLINE) 50 MG tablet Take 1 tablet (50 mg total) by mouth 3 (three) times daily.  90 tablet  3  . hydrochlorothiazide (HYDRODIURIL) 25 MG tablet Take 1 tablet (25 mg total) by mouth daily.  30 tablet  0  . isosorbide mononitrate (IMDUR) 60 MG 24 hr tablet Take 1 tablet (60 mg total) by mouth daily.  90 tablet  3  . metoprolol tartrate (LOPRESSOR) 25 MG tablet Take 1 tablet (25 mg total) by mouth 2 (two) times daily.  60 tablet  0  . buPROPion (WELLBUTRIN) 75 MG tablet Take 1 tablet (75 mg total) by mouth 2 (two) times daily.  60 tablet  3  . cetirizine (ZYRTEC) 10 MG tablet Take 10 mg by mouth daily.      . diphenhydrAMINE (BENADRYL) 25 MG tablet Take 25 mg by mouth once.      . enalapril (VASOTEC) 10 MG tablet Take 1 tablet (10 mg total) by mouth daily.  30 tablet  5  . Pseudoephedrine-APAP-DM (DAYQUIL PO) Take 30 mLs by mouth 2 (two) times daily as needed.      . Pseudoephedrine-Ibuprofen (IBUPROFEN COLD & SINUS PO) Take 1 capsule by mouth once.       No current facility-administered medications on file prior to  visit.   Family History  Problem Relation Age of Onset  . Hypertension Mother   . Hyperlipidemia Mother   . Diabetes Maternal Aunt    History   Social History  . Marital Status: Single    Spouse Name: N/A    Number of Children: N/A  . Years of Education: N/A   Occupational History  . Not on file.   Social History Main Topics  . Smoking status: Current Every Day Smoker  . Smokeless tobacco: Not on file  . Alcohol Use: Yes  . Drug Use: Yes    Special: Marijuana  . Sexual Activity: Not on file   Other Topics Concern  . Not on file   Social History Narrative  . No narrative on file   Review of Systems  HENT: Negative.   Respiratory: Negative.   Cardiovascular: Positive for chest pain (followed by cards for cx pain, imdur increased last visit.).  Gastrointestinal: Negative.   Neurological: Positive for tingling (right hand). Negative for dizziness.     Objective:   Filed Vitals:   09/28/13 1432  BP: 111/77  Pulse: 92  Temp: 99.1 F (37.3 C)  Resp: 16    Physical Exam: Constitutional: Patient appears well-developed  and well-nourished. No distress. Eyes: Conjunctivae and EOM are normal. PERRLA, no scleral icterus. Neck: Normal ROM. Neck supple. No JVD. No tracheal deviation. No thyromegaly. CVS: RRR, S1/S2 +, no murmurs, no gallops, no carotid bruit.  Pulmonary: Effort and breath sounds normal, no stridor, rhonchi, wheezes, rales.  Abdominal: Soft. BS +,  no distension, tenderness, rebound or guarding.  Musculoskeletal: Normal range of motion. No edema and no tenderness.  Lymphadenopathy: No lymphadenopathy noted, cervical Neuro: Alert. Normal reflexes, muscle tone coordination. No cranial nerve deficit. Skin: Skin is warm and dry. No rash noted. Not diaphoretic. No erythema. No pallor. Psychiatric: Normal mood and affect. Behavior, judgment, thought content normal.  Lab Results  Component Value Date   WBC 6.5 06/23/2013   HGB 12.4 06/23/2013   HCT 36.1  06/23/2013   MCV 83.6 06/23/2013   PLT 174 06/23/2013   Lab Results  Component Value Date   CREATININE 0.60 06/24/2013   BUN 12 06/24/2013   NA 139 06/24/2013   K 4.1 06/24/2013   CL 102 06/24/2013   CO2 23 06/24/2013    No results found for this basename: HGBA1C   Lipid Panel     Component Value Date/Time   CHOL 148 10/20/2009 2054   TRIG 67 10/20/2009 2054   HDL 64 10/20/2009 2054   CHOLHDL 2.3 Ratio 10/20/2009 2054   VLDL 13 10/20/2009 2054   LDLCALC 71 10/20/2009 2054       Assessment and plan:   Patricia [place] was seen today for follow-up.  Diagnoses and associated orders for this visit:  HYPERTENSION Continue current medication regimen  Smoking addiction - buPROPion (WELLBUTRIN) 75 MG tablet; Take 1 tablet (75 mg total) by mouth daily.  Hepatitis C - Ambulatory referral to Infectious Disease  Unspecified constipation - polyethylene glycol powder (GLYCOLAX/MIRALAX) powder; Take 17 g by mouth daily.  Hand pain - gabapentin (NEURONTIN) 100 MG capsule; Take 1 capsule (100 mg total) by mouth at bedtime.   Return in about 3 months (around 12/29/2013) for f/u.       Lance Bosch, [facility] [phone] 09/28/2013, 2:47 PM

## 2013-09-28 NOTE — Patient Instructions (Addendum)
Smoking Cessation Quitting smoking is important to your health and has many advantages. However, it is not always easy to quit since nicotine is a very addictive drug. Often times, people try 3 times or more before being able to quit. This document explains the best ways for you to prepare to quit smoking. Quitting takes hard work and a lot of effort, but you can do it. ADVANTAGES OF QUITTING SMOKING  You will live longer, feel better, and live better.  Your body will feel the impact of quitting smoking almost immediately.  Within 20 minutes, blood pressure decreases. Your pulse returns to its normal level.  After 8 hours, carbon monoxide levels in the blood return to normal. Your oxygen level increases.  After 24 hours, the chance of having a heart attack starts to decrease. Your breath, hair, and body stop smelling like smoke.  After 48 hours, damaged nerve endings begin to recover. Your sense of taste and smell improve.  After 72 hours, the body is virtually free of nicotine. Your bronchial tubes relax and breathing becomes easier.  After 2 to 12 weeks, lungs can hold more air. Exercise becomes easier and circulation improves.  The risk of having a heart attack, stroke, cancer, or lung disease is greatly reduced.  After 1 year, the risk of coronary heart disease is cut in half.  After 5 years, the risk of stroke falls to the same as a nonsmoker.  After 10 years, the risk of lung cancer is cut in half and the risk of other cancers decreases significantly.  After 15 years, the risk of coronary heart disease drops, usually to the level of a nonsmoker.  If you are pregnant, quitting smoking will improve your chances of having a healthy baby.  The people you live with, especially any children, will be healthier.  You will have extra money to spend on things other than cigarettes. QUESTIONS TO THINK ABOUT BEFORE ATTEMPTING TO QUIT You may want to talk about your answers with your  caregiver.  Why do you want to quit?  If you tried to quit in the past, what helped and what did not?  What will be the most difficult situations for you after you quit? How will you plan to handle them?  Who can help you through the tough times? Your family? Friends? A caregiver?  What pleasures do you get from smoking? What ways can you still get pleasure if you quit? Here are some questions to ask your caregiver:  How can you help me to be successful at quitting?  What medicine do you think would be best for me and how should I take it?  What should I do if I need more help?  What is smoking withdrawal like? How can I get information on withdrawal? GET READY  Set a quit date.  Change your environment by getting rid of all cigarettes, ashtrays, matches, and lighters in your home, car, or work. Do not let people smoke in your home.  Review your past attempts to quit. Think about what worked and what did not. GET SUPPORT AND ENCOURAGEMENT You have a better chance of being successful if you have help. You can get support in many ways.  Tell your family, friends, and co-workers that you are going to quit and need their support. Ask them not to smoke around you.  Get individual, group, or telephone counseling and support. Programs are available at local hospitals and health centers. Call your local health department for   information about programs in your area.  Spiritual beliefs and practices may help some smokers quit.  Download a "quit meter" on your computer to keep track of quit statistics, such as how long you have gone without smoking, cigarettes not smoked, and money saved.  Get a self-help book about quitting smoking and staying off of tobacco. [place] yourself from urges to smoke. Talk to someone, go for a walk, or occupy your time with a task.  Change your normal routine. Take a different route to work. Drink tea instead of coffee.  Eat breakfast in a different place.  Reduce your stress. Take a hot bath, exercise, or read a book.  Plan something enjoyable to do every day. Reward yourself for not smoking.  Explore interactive web-based programs that specialize in helping you quit. GET MEDICINE AND USE IT CORRECTLY Medicines can help you stop smoking and decrease the urge to smoke. Combining medicine with the above behavioral methods and support can greatly increase your chances of successfully quitting smoking.  Nicotine replacement therapy helps deliver nicotine to your body without the negative effects and risks of smoking. Nicotine replacement therapy includes nicotine gum, lozenges, inhalers, nasal sprays, and skin patches. Some may be available over-the-counter and others require a prescription.  Antidepressant medicine helps people abstain from smoking, but how this works is unknown. This medicine is available by prescription.  Nicotinic receptor partial agonist medicine simulates the effect of nicotine in your brain. This medicine is available by prescription. Ask your caregiver for advice about which medicines to use and how to use them based on your health history. Your caregiver will tell you what side effects to look out for if you choose to be on a medicine or therapy. Carefully read the information on the package. Do not use any other product containing nicotine while using a nicotine replacement product.  RELAPSE OR DIFFICULT SITUATIONS Most relapses occur within the first 3 months after quitting. Do not be discouraged if you start smoking again. Remember, most people try several times before finally quitting. You may have symptoms of withdrawal because your body is used to nicotine. You may crave cigarettes, be irritable, feel very hungry, cough often, get headaches, or have difficulty concentrating. The withdrawal symptoms are only temporary. They are strongest when you first quit, but they will go away within  10 14 days. To reduce the chances of relapse, try to:  Avoid drinking alcohol. Drinking lowers your chances of successfully quitting.  Reduce the amount of caffeine you consume. Once you quit smoking, the amount of caffeine in your body increases and can give you symptoms, such as a rapid heartbeat, sweating, and anxiety.  Avoid smokers because they can make you want to smoke.  Do not let weight gain distract you. Many smokers will gain weight when they quit, usually less than 10 pounds. Eat a healthy diet and stay active. You can always lose the weight gained after you quit.  Find ways to improve your mood other than smoking. FOR MORE INFORMATION  www.smokefree.gov  Document Released: 04/09/2001 Document Revised: 10/15/2011 Document Reviewed: 07/25/2011 [employer] Patient Information 2014 [place], [place]. Constipation, Adult Constipation is when a person has fewer than 3 bowel movements a week; has difficulty having a bowel movement; or has stools that are dry, hard, or larger than normal. As people grow older, constipation is more common. If you try to fix constipation with medicines that make you have a bowel movement (laxatives), the problem  may get worse. Long-term laxative use may cause the muscles of the colon to become weak. A low-fiber diet, not taking in enough fluids, and taking certain medicines may make constipation worse. CAUSES   Certain medicines, such as antidepressants, pain medicine, iron supplements, antacids, and water pills.   Certain diseases, such as diabetes, irritable bowel syndrome (IBS), thyroid disease, or depression.   Not drinking enough water.   Not eating enough fiber-rich foods.   Stress or travel.  Lack of physical activity or exercise.  Not going to the restroom when there is the urge to have a bowel movement.  Ignoring the urge to have a bowel movement.  Using laxatives too much. SYMPTOMS   Having fewer than 3 bowel movements a week.    Straining to have a bowel movement.   Having hard, dry, or larger than normal stools.   Feeling full or bloated.   Pain in the lower abdomen.  Not feeling relief after having a bowel movement. DIAGNOSIS  Your caregiver will take a medical history and perform a physical exam. Further testing may be done for severe constipation. Some tests may include:   A barium enema X-ray to examine your rectum, colon, and sometimes, your small intestine.  A sigmoidoscopy to examine your lower colon.  A colonoscopy to examine your entire colon. TREATMENT  Treatment will depend on the severity of your constipation and what is causing it. Some dietary treatments include drinking more fluids and eating more fiber-rich foods. Lifestyle treatments may include regular exercise. If these diet and lifestyle recommendations do not help, your caregiver may recommend taking over-the-counter laxative medicines to help you have bowel movements. Prescription medicines may be prescribed if over-the-counter medicines do not work.  HOME CARE INSTRUCTIONS   Increase dietary fiber in your diet, such as fruits, vegetables, whole grains, and beans. Limit high-fat and processed sugars in your diet, such as French fries, hamburgers, cookies, candies, and soda.   A fiber supplement may be added to your diet if you cannot get enough fiber from foods.   Drink enough fluids to keep your urine clear or pale yellow.   Exercise regularly or as directed by your caregiver.   Go to the restroom when you have the urge to go. Do not hold it.  Only take medicines as directed by your caregiver. Do not take other medicines for constipation without talking to your caregiver first. SEEK IMMEDIATE MEDICAL CARE IF:   You have bright red blood in your stool.   Your constipation lasts for more than 4 days or gets worse.   You have abdominal or rectal pain.   You have thin, pencil-like stools.  You have unexplained  weight loss. MAKE SURE YOU:   Understand these instructions.  Will watch your condition.  Will get help right away if you are not doing well or get worse. Document Released: 01/12/2004 Document Revised: 07/08/2011 Document Reviewed: 01/25/2013 [employer] Patient Information 2014 [employer].  

## 2013-10-07 ENCOUNTER — Other Ambulatory Visit: Payer: Medicaid Other

## 2013-10-11 ENCOUNTER — Other Ambulatory Visit: Payer: Medicaid Other

## 2013-11-26 ENCOUNTER — Telehealth: Payer: Self-pay | Admitting: Internal Medicine

## 2013-11-26 NOTE — Telephone Encounter (Signed)
Called pt to verify concern; patient VM not set up to leave a message

## 2013-12-01 NOTE — Telephone Encounter (Signed)
I tried calling the pts home phone not working and cell phones VM not set up.

## 2013-12-02 NOTE — Telephone Encounter (Signed)
Pt returning call back. Please f/u with pt.

## 2013-12-03 ENCOUNTER — Other Ambulatory Visit: Payer: Self-pay | Admitting: Internal Medicine

## 2013-12-10 ENCOUNTER — Telehealth: Payer: Self-pay | Admitting: *Deleted

## 2013-12-10 NOTE — Telephone Encounter (Signed)
I spoke with the pt and informed her that the RX lisinopril was at her pharmacy.

## 2013-12-25 ENCOUNTER — Emergency Department (HOSPITAL_COMMUNITY)
Admission: EM | Admit: 2013-12-25 | Discharge: 2013-12-25 | Disposition: A | Discharge status: Home / Self Care | Payer: Medicaid Other | Attending: Emergency Medicine | Admitting: Emergency Medicine

## 2013-12-25 ENCOUNTER — Emergency Department (HOSPITAL_COMMUNITY): Payer: Medicaid Other

## 2013-12-25 ENCOUNTER — Encounter (HOSPITAL_COMMUNITY): Payer: Self-pay | Admitting: Emergency Medicine

## 2013-12-25 ENCOUNTER — Other Ambulatory Visit: Payer: Self-pay | Admitting: Internal Medicine

## 2013-12-25 DIAGNOSIS — I10 Essential (primary) hypertension: Secondary | ICD-10-CM | POA: Insufficient documentation

## 2013-12-25 DIAGNOSIS — R0609 Other forms of dyspnea: Secondary | ICD-10-CM | POA: Insufficient documentation

## 2013-12-25 DIAGNOSIS — F17200 Nicotine dependence, unspecified, uncomplicated: Secondary | ICD-10-CM | POA: Insufficient documentation

## 2013-12-25 DIAGNOSIS — R0602 Shortness of breath: Secondary | ICD-10-CM | POA: Insufficient documentation

## 2013-12-25 DIAGNOSIS — R079 Chest pain, unspecified: Secondary | ICD-10-CM | POA: Insufficient documentation

## 2013-12-25 DIAGNOSIS — Z7982 Long term (current) use of aspirin: Secondary | ICD-10-CM | POA: Insufficient documentation

## 2013-12-25 DIAGNOSIS — R0600 Dyspnea, unspecified: Secondary | ICD-10-CM

## 2013-12-25 DIAGNOSIS — R0989 Other specified symptoms and signs involving the circulatory and respiratory systems: Principal | ICD-10-CM | POA: Insufficient documentation

## 2013-12-25 DIAGNOSIS — Z8619 Personal history of other infectious and parasitic diseases: Secondary | ICD-10-CM | POA: Insufficient documentation

## 2013-12-25 DIAGNOSIS — Z79899 Other long term (current) drug therapy: Secondary | ICD-10-CM | POA: Insufficient documentation

## 2013-12-25 LAB — BASIC METABOLIC PANEL
ANION GAP: 11 (ref 5–15)
BUN: 7 mg/dL (ref 6–23)
CHLORIDE: 104 meq/L (ref 96–112)
CO2: 24 meq/L (ref 19–32)
CREATININE: 0.59 mg/dL (ref 0.50–1.10)
Calcium: 8.4 mg/dL (ref 8.4–10.5)
GFR calc Af Amer: >90 mL/min (ref >90)
GFR calc non Af Amer: >90 mL/min (ref >90)
GLUCOSE: 82 mg/dL (ref 70–99)
Potassium: 3.9 mEq/L (ref 3.7–5.3)
Sodium: 139 mEq/L (ref 137–147)

## 2013-12-25 LAB — I-STAT TROPONIN, ED: Troponin i, poc: 0.01 ng/mL (ref 0.00–0.08)

## 2013-12-25 LAB — CBC
HEMATOCRIT: 37.5 % (ref 36.0–46.0)
Hemoglobin: 13 g/dL (ref 12.0–15.0)
MCH: 28.7 pg (ref 26.0–34.0)
MCHC: 34.7 g/dL (ref 30.0–36.0)
MCV: 82.8 fL (ref 78.0–100.0)
Platelets: 248 10*3/uL (ref 150–400)
RBC: 4.53 MIL/uL (ref 3.87–5.11)
RDW: 13.4 % (ref 11.5–15.5)
WBC: 5.8 10*3/uL (ref 4.0–10.5)

## 2013-12-25 MED ORDER — LABETALOL HCL 5 MG/ML IV SOLN
10.0000 mg | Freq: Once | INTRAVENOUS | Status: DC
  Filled 2013-12-25: qty 4

## 2013-12-25 MED ORDER — LABETALOL HCL 100 MG PO TABS
100.0000 mg | ORAL_TABLET | Freq: Once | ORAL | Status: AC
  Administered 2013-12-25: 100 mg via ORAL
  Filled 2013-12-25: qty 1

## 2013-12-25 NOTE — ED Notes (Signed)
Pt reports R side CP that radiates to her back.  Pt reports that the pain is affecting her breathing.

## 2013-12-25 NOTE — ED Provider Notes (Addendum)
CSN: [id number]     Arrival date & time 12/25/13  1405 History   First MD Initiated Contact with Patient 12/25/13 1447     Chief Complaint  Patient presents with  . Chest Pain  . Shortness of Breath     (Consider location/radiation/quality/duration/timing/severity/associated sxs/prior Treatment) Patient is a 38 y.o. female presenting with shortness of breath.  Shortness of Breath Severity:  Moderate Onset quality:  Gradual Duration:  2 days Timing:  Constant Progression:  Unchanged Chronicity:  Recurrent Context: not URI   Context comment:  Ran out of antihypertensives Relieved by:  Nothing Worsened by:  Nothing tried Associated symptoms: no abdominal pain, no chest pain, no cough, no fever, no rash, no sore throat and no vomiting     Past Medical History  Diagnosis Date  . Hypertension   . Hepatitis C    Past Surgical History  Procedure Laterality Date  . Liver biopsy    . Liver biopsy     Family History  Problem Relation Age of Onset  . Hypertension Mother   . Hyperlipidemia Mother   . Diabetes Maternal Aunt    History  Substance Use Topics  . Smoking status: Current Every Day Smoker  . Smokeless tobacco: Not on file  . Alcohol Use: Yes   OB History   Grav Para Term Preterm Abortions TAB SAB Ect Mult Living                 Review of Systems  Constitutional: Negative for fever and chills.  HENT: Negative for congestion, rhinorrhea and sore throat.   Eyes: Negative for photophobia and visual disturbance.  Respiratory: Positive for shortness of breath. Negative for cough.   Cardiovascular: Negative for chest pain and leg swelling.  Gastrointestinal: Negative for nausea, vomiting, abdominal pain, diarrhea and constipation.  Endocrine: Negative for polyphagia and polyuria.  Genitourinary: Negative for dysuria, flank pain, vaginal bleeding, vaginal discharge and enuresis.  Musculoskeletal: Negative for back pain and gait problem.  Skin: Negative for color  change and rash.  Neurological: Negative for dizziness, syncope, light-headedness and numbness.  Hematological: Negative for adenopathy. Does not bruise/bleed easily.  All other systems reviewed and are negative.     Allergies  Review of patient's allergies indicates no known allergies.  Home Medications   Prior to Admission medications   Medication Sig Start Date End Date Taking? Authorizing Provider  aspirin 81 MG chewable tablet Chew 1 tablet (81 mg total) by mouth daily. 06/24/13  Yes Ripudeep Krystal Eaton, MD  hydrALAZINE (APRESOLINE) 50 MG tablet Take 1 tablet (50 mg total) by mouth 3 (three) times daily. 09/16/13  Yes Tresa Garter, MD  isosorbide mononitrate (IMDUR) 60 MG 24 hr tablet Take 1 tablet (60 mg total) by mouth daily. 07/13/13  Yes Tresa Garter, MD  lisinopril (PRINIVIL,ZESTRIL) 10 MG tablet Take 10 mg by mouth daily.   Yes Historical Provider, MD   BP 178/102  Pulse 66  Temp(Src) 98.5 F (36.9 C) (Oral)  Resp 18  Ht 5\' 2"  (1.575 m)  Wt 164 lb (74.39 kg)  BMI 29.99 kg/m2  SpO2 98%  LMP 12/25/2013 Physical Exam  Vitals reviewed. Constitutional: She is oriented to person, place, and time. She appears well-developed and well-nourished.  HENT:  Head: Normocephalic and atraumatic.  Right Ear: External ear normal.  Left Ear: External ear normal.  Eyes: Conjunctivae and EOM are normal. Pupils are equal, round, and reactive to light.  Neck: Normal range of motion. Neck supple.  Cardiovascular: Normal rate, regular rhythm, normal heart sounds and intact distal pulses.   Pulmonary/Chest: Effort normal and breath sounds normal.  Abdominal: Soft. Bowel sounds are normal. There is no tenderness.  Musculoskeletal: Normal range of motion.       Right lower leg: She exhibits no edema.       Left lower leg: She exhibits no edema.  Neurological: She is alert and oriented to person, place, and time.  Skin: Skin is warm and dry.    ED Course  Procedures (including  critical care time) Labs Review Labs Reviewed  CBC  BASIC METABOLIC PANEL  Randolm Idol, ED    Imaging Review Dg Chest 2 View  12/25/2013   CLINICAL DATA:  Shortness of breath  EXAM: CHEST  2 VIEW  COMPARISON:  06/24/2013  FINDINGS: Cardiac shadow is mildly enlarged. The thoracic aorta is again noted to be tortuous. No focal infiltrate or sizable effusion is seen. No acute bony abnormality is noted.  IMPRESSION: No active cardiopulmonary disease.   Electronically Signed   By: Inez Catalina M.D.   On: 12/25/2013 16:03     EKG Interpretation None      MDM   Final diagnoses:  Dyspnea    38 y.o. female  with pertinent PMH of HTN and hypertensive cardiomyopathy presents with dyspnea and chest tightness not described as pain since yesterday am.  She has been out of her antihypertensives until yesterady, when she reobtained them.  No Fevers, cough, leg swelling.  Physical exam and vitals as above.  No signs of chf on my exam. Dyspnea progressive, no acute worsening over last 24 hours.    Labs and imaging as above reviewed. No trop elevation, and pain has been constant for at least 6 hours.  Pt not requiring O2 and has no lab abnormalities.  BP normalized without intervention.  Will have pt fu with pcp, and she is to resume taking her antihypertensives.  Likely Hypertensive urgency, doubt emergency given unremarkable labwork and ECG.  1. Dyspnea         Debby Freiberg, MD 12/26/13 [place], MD 12/26/13 [phone]

## 2013-12-25 NOTE — ED Notes (Signed)
Patient returned from X-ray 

## 2013-12-25 NOTE — Discharge Instructions (Signed)

## 2013-12-25 NOTE — ED Notes (Signed)
Patient transported to X-ray 

## 2013-12-29 ENCOUNTER — Emergency Department (HOSPITAL_COMMUNITY)
Admission: EM | Admit: 2013-12-29 | Discharge: 2013-12-29 | Discharge status: Against Medical Advice | Payer: Medicaid Other | Attending: Emergency Medicine | Admitting: Emergency Medicine

## 2013-12-29 ENCOUNTER — Ambulatory Visit: Payer: Self-pay | Attending: Internal Medicine | Admitting: Internal Medicine

## 2013-12-29 ENCOUNTER — Encounter (HOSPITAL_COMMUNITY): Payer: Self-pay | Admitting: Emergency Medicine

## 2013-12-29 ENCOUNTER — Encounter: Payer: Self-pay | Admitting: Internal Medicine

## 2013-12-29 VITALS — BP 218/124 | HR 77 | Temp 98.7°F | Resp 16 | Ht 61.0 in | Wt 160.0 lb

## 2013-12-29 DIAGNOSIS — F10939 Alcohol use, unspecified with withdrawal, unspecified: Secondary | ICD-10-CM | POA: Insufficient documentation

## 2013-12-29 DIAGNOSIS — F10239 Alcohol dependence with withdrawal, unspecified: Secondary | ICD-10-CM | POA: Insufficient documentation

## 2013-12-29 DIAGNOSIS — F17200 Nicotine dependence, unspecified, uncomplicated: Secondary | ICD-10-CM | POA: Insufficient documentation

## 2013-12-29 DIAGNOSIS — B1920 Unspecified viral hepatitis C without hepatic coma: Secondary | ICD-10-CM | POA: Insufficient documentation

## 2013-12-29 DIAGNOSIS — Z8249 Family history of ischemic heart disease and other diseases of the circulatory system: Secondary | ICD-10-CM | POA: Insufficient documentation

## 2013-12-29 DIAGNOSIS — Z7982 Long term (current) use of aspirin: Secondary | ICD-10-CM | POA: Insufficient documentation

## 2013-12-29 DIAGNOSIS — I10 Essential (primary) hypertension: Secondary | ICD-10-CM | POA: Insufficient documentation

## 2013-12-29 DIAGNOSIS — F1010 Alcohol abuse, uncomplicated: Secondary | ICD-10-CM | POA: Insufficient documentation

## 2013-12-29 MED ORDER — ISOSORBIDE MONONITRATE ER 60 MG PO TB24: 60.0000 mg | ORAL_TABLET | Freq: Every day | ORAL | Status: DC

## 2013-12-29 MED ORDER — CHLORDIAZEPOXIDE HCL 10 MG PO CAPS: 10.0000 mg | ORAL_CAPSULE | Freq: Three times a day (TID) | ORAL | Status: DC | PRN

## 2013-12-29 MED ORDER — LISINOPRIL-HYDROCHLOROTHIAZIDE 10-12.5 MG PO TABS: 1.0000 | ORAL_TABLET | Freq: Every day | ORAL | Status: DC

## 2013-12-29 MED ORDER — HYDRALAZINE HCL 50 MG PO TABS: 50.0000 mg | ORAL_TABLET | Freq: Three times a day (TID) | ORAL | Status: DC

## 2013-12-29 MED ORDER — HYDRALAZINE HCL 50 MG PO TABS
50.0000 mg | ORAL_TABLET | Freq: Three times a day (TID) | ORAL | Status: DC
Start: 2013-12-29 — End: 2014-05-11

## 2013-12-29 MED ORDER — VITAMIN B-1 100 MG PO TABS: 100.0000 mg | ORAL_TABLET | Freq: Every day | ORAL | Status: DC

## 2013-12-29 NOTE — Patient Instructions (Signed)

## 2013-12-29 NOTE — Progress Notes (Signed)
LCSW met with patient about alcohol abuse as she is working toward abstinence.  Patient states that she has not had a drink in the past 3 days.  LCSW encouraged patient to incorporate activities to support abstinence such as things that will take her attention and support her growth and development.  LCSW also encouraged patient to assess the value of the money that she is saving by not drinking.  LCSW provided patient with lists of daily AA groups in [place] in order to have further support with managing her sobriety.    Christene Lye MSW, LCSW

## 2013-12-29 NOTE — ED Notes (Signed)
The pt was setn here from the [facility] clinic with high bp.  She has been out of her bp meds 2 of them for 1-2 weeks.  She only has the one of three that she has been taking .  No headache or other related symptoms

## 2013-12-29 NOTE — Progress Notes (Signed)
Patient ID: [name], female   DOB: 09/29/1975, 38 y.o.   MRN: [id number]  CC: HTN follow up. Alcohol abuse  HPI:  Patient has only been taking hydralizine since we last met.  She reports that she has been withdrawing from alcohol for the past three days and feel like her nerves are bad.  She endorses headaches, sweating, and agitation.  She denies tremors, nausea, vomiting, halucinations, or auditory disturbances. She has a appointment with alcohol substance abuse program on Sept 11th and is concerned about detoxing.  Was drinking multiple cans of mixed drink and a unknown amount of liquor daily  No Known Allergies Past Medical History  Diagnosis Date  . Hypertension   . Hepatitis C    Current Outpatient Prescriptions on File Prior to Visit  Medication Sig Dispense Refill  . aspirin 81 MG chewable tablet Chew 1 tablet (81 mg total) by mouth daily.  30 tablet  5  . hydrALAZINE (APRESOLINE) 50 MG tablet Take 1 tablet (50 mg total) by mouth 3 (three) times daily.  90 tablet  3  . isosorbide mononitrate (IMDUR) 60 MG 24 hr tablet Take 1 tablet (60 mg total) by mouth daily.  90 tablet  3  . lisinopril (PRINIVIL,ZESTRIL) 10 MG tablet Take 10 mg by mouth daily.       No current facility-administered medications on file prior to visit.   Family History  Problem Relation Age of Onset  . Hypertension Mother   . Hyperlipidemia Mother   . Diabetes Maternal Aunt    History   Social History  . Marital Status: Single    Spouse Name: N/A    Number of Children: N/A  . Years of Education: N/A   Occupational History  . Not on file.   Social History Main Topics  . Smoking status: Current Every Day Smoker  . Smokeless tobacco: Not on file  . Alcohol Use: Yes  . Drug Use: Yes    Special: Marijuana  . Sexual Activity: Not on file   Other Topics Concern  . Not on file   Social History Narrative  . No narrative on file    Review of Systems: See HPI   Objective:   Filed  Vitals:   12/29/13 1613  BP: 228/130  Pulse: 77  Temp: 98.7 F (37.1 C)  Resp: 16    Physical Exam: Constitutional: Patient appears well-developed and well-nourished. No distress. HENT: Normocephalic, atraumatic, External right and left ear normal. Oropharynx is clear and moist.  Eyes: Conjunctivae and EOM are normal. PERRLA, no scleral icterus. Neck: Normal ROM. Neck supple. No JVD. No tracheal deviation. No thyromegaly. CVS: RRR, S1/S2 +, no murmurs, no gallops, no carotid bruit.  Pulmonary: Effort and breath sounds normal, no stridor, rhonchi, wheezes, rales.  Abdominal: Soft. BS +,  no distension, tenderness, rebound or guarding.  Musculoskeletal: Normal range of motion. No edema and no tenderness.  Lymphadenopathy: No lymphadenopathy noted, cervical Neuro: Alert. Normal reflexes, muscle tone coordination. NO tremors  Skin: Skin is warm and dry. No rash noted. Not diaphoretic. No erythema. No pallor. Psychiatric: Normal mood and affect. Behavior, judgment, thought content normal.  Lab Results  Component Value Date   WBC 5.8 12/25/2013   HGB 13.0 12/25/2013   HCT 37.5 12/25/2013   MCV 82.8 12/25/2013   PLT 248 12/25/2013   Lab Results  Component Value Date   CREATININE 0.59 12/25/2013   BUN 7 12/25/2013   NA 139 12/25/2013   K 3.9 12/25/2013  CL 104 12/25/2013   CO2 24 12/25/2013    No results found for this basename: HGBA1C   Lipid Panel     Component Value Date/Time   CHOL 148 10/20/2009 2054   TRIG 67 10/20/2009 2054   HDL 64 10/20/2009 2054   CHOLHDL 2.3 Ratio 10/20/2009 2054   VLDL 13 10/20/2009 2054   LDLCALC 71 10/20/2009 2054       Assessment and plan:   [name] was seen today for follow-up.  Diagnoses and associated orders for this visit:  Essential hypertension Patient was given clonidine 0.$RemoveBeforeDEI'2mg'[id number]$  and BP did not drop.  Patient will be transferred to the ER for further management.  Refilled - hydrALAZINE (APRESOLINE) 50 MG tablet; Take 1 tablet (50 mg total)  by mouth 3 (three) times daily. - isosorbide mononitrate (IMDUR) 60 MG 24 hr tablet; Take 1 tablet (60 mg total) by mouth daily. - lisinopril-hydrochlorothiazide (PRINZIDE,ZESTORETIC) 10-12.5 MG per tablet; Take 1 tablet by mouth daily.  Alcohol abuse - chlordiazePOXIDE (LIBRIUM) 10 MG capsule; Take 1 capsule (10 mg total) by mouth 3 (three) times daily as needed for anxiety. - thiamine (VITAMIN B-1) 100 MG tablet; Take 1 tablet (100 mg total) by mouth daily.   Return in about 1 week (around 01/05/2014) for Nurse Visit-BP check and alcohol withdrawal check.Patricia Manning, NP-C [facility] [phone] 01/03/2014, 2:48 PM

## 2013-12-29 NOTE — ED Notes (Signed)
Unable to locate patient for room x3, pt pager found in department

## 2013-12-29 NOTE — Progress Notes (Signed)
Pt is here following up on her HTN. Pt was unable to get some of her medications and she had to go to the ED with high blood pressure.

## 2014-01-14 ENCOUNTER — Telehealth: Payer: Self-pay | Admitting: Internal Medicine

## 2014-01-14 ENCOUNTER — Other Ambulatory Visit: Payer: Self-pay | Admitting: *Deleted

## 2014-01-14 DIAGNOSIS — F1010 Alcohol abuse, uncomplicated: Secondary | ICD-10-CM

## 2014-01-14 MED ORDER — CHLORDIAZEPOXIDE HCL 10 MG PO CAPS: 10.0000 mg | ORAL_CAPSULE | Freq: Three times a day (TID) | ORAL | Status: DC | PRN

## 2014-01-14 NOTE — Progress Notes (Signed)
Pt needed enough librium to get her to her doctors appointment. So I filled the RX for 6 days.

## 2014-01-14 NOTE — Telephone Encounter (Signed)
Pt. Called to request a refill for chlordiazePOXIDE (LIBRIUM) 10 MG capsule. Please f/u with pt.

## 2014-01-18 NOTE — Telephone Encounter (Signed)
Pt received the librium.

## 2014-01-31 ENCOUNTER — Ambulatory Visit: Payer: Medicaid Other

## 2014-03-29 ENCOUNTER — Telehealth: Payer: Self-pay | Admitting: Internal Medicine

## 2014-03-29 NOTE — Telephone Encounter (Signed)
Pt. Came into facility to request a medication change, pt. Believes that one of her blood pressure medications is giving her a dry cough. Pt. Also states that the medication for her hand pain is not working. Please f/u with pt.

## 2014-04-06 ENCOUNTER — Telehealth: Payer: Self-pay | Admitting: Emergency Medicine

## 2014-04-06 MED ORDER — LOSARTAN POTASSIUM 50 MG PO TABS: 50.0000 mg | ORAL_TABLET | Freq: Every day | ORAL | Status: DC

## 2014-04-06 NOTE — Telephone Encounter (Signed)
Lisinopril does not cause Diabetes, it protects kidney function in diabetic.  The medication is causing the cough, please have her come back for office visit. She never came back for a BP recheck.  You may switch her to losartan 50 mg QD. Thanks

## 2014-04-06 NOTE — Telephone Encounter (Signed)
Pt states medication Lisinopril causing freq cough and mother informed her that due to family history, she should be taking Lisinopril ( causes Diabetes)?? Pt also requesting medication increase Gabapentin for carpel tunnel- Med not on MAR Please f/u with changes

## 2014-04-06 NOTE — Telephone Encounter (Signed)
After speaking with [name], pt needs to stop taking Lisinopril and start new medication Losartan 50 mg tab daily with f/u BP recheck Scheduled nurse visit 12/1/515 @ 230 pm Medication @ [place]

## 2014-04-07 ENCOUNTER — Encounter (HOSPITAL_COMMUNITY): Payer: Self-pay | Admitting: Cardiovascular Disease

## 2014-04-08 ENCOUNTER — Emergency Department (HOSPITAL_COMMUNITY)
Admission: EM | Admit: 2014-04-08 | Discharge: 2014-04-08 | Disposition: A | Discharge status: Home / Self Care | Payer: Self-pay | Source: Home / Self Care | Attending: Family Medicine | Admitting: Family Medicine

## 2014-04-08 ENCOUNTER — Telehealth: Payer: Self-pay | Admitting: Internal Medicine

## 2014-04-08 ENCOUNTER — Encounter (HOSPITAL_COMMUNITY): Payer: Self-pay | Admitting: *Deleted

## 2014-04-08 DIAGNOSIS — Z23 Encounter for immunization: Secondary | ICD-10-CM

## 2014-04-08 DIAGNOSIS — L03129 Acute lymphangitis of unspecified part of limb: Secondary | ICD-10-CM

## 2014-04-08 MED ORDER — TETANUS-DIPHTH-ACELL PERTUSSIS 5-2.5-18.5 LF-MCG/0.5 IM SUSP
0.5000 mL | Freq: Once | INTRAMUSCULAR | Status: AC
  Administered 2014-04-08: 0.5 mL via INTRAMUSCULAR

## 2014-04-08 MED ORDER — TETANUS-DIPHTH-ACELL PERTUSSIS 5-2.5-18.5 LF-MCG/0.5 IM SUSP
INTRAMUSCULAR | Status: AC
  Filled 2014-04-08: qty 0.5

## 2014-04-08 MED ORDER — MUPIROCIN CALCIUM 2 % EX CREA: 1.0000 "application " | TOPICAL_CREAM | Freq: Two times a day (BID) | CUTANEOUS | Status: DC

## 2014-04-08 MED ORDER — MINOCYCLINE HCL 100 MG PO CAPS: 100.0000 mg | ORAL_CAPSULE | Freq: Two times a day (BID) | ORAL | Status: DC

## 2014-04-08 NOTE — Telephone Encounter (Signed)
Pt. Came into facility to speak to nurse about a bite mark on left wrist, pt. States that it is painful and swollen. Pt was advised to go to Urgent Care by on call nurse but would also like some advice from PCP nurse. Please f/u

## 2014-04-08 NOTE — ED Notes (Signed)
Pt  Has   redness  Swelling   And  Pain   l       Arm      Pt  Had  A  Small  Bump  That  Bursted        And  The  Symptoms  Spread  Up  Her  Arm

## 2014-04-08 NOTE — Discharge Instructions (Signed)
Warm compress twice a day when you take the antibiotic, take all of medicine, return as needed. °

## 2014-04-08 NOTE — ED Provider Notes (Signed)
CSN: [id number]     Arrival date & time 04/08/14  1018 History   First MD Initiated Contact with Patient 04/08/14 1022     Chief Complaint  Patient presents with  . Arm Swelling   (Consider location/radiation/quality/duration/timing/severity/associated sxs/prior Treatment) Patient is a 38 y.o. female presenting with rash. The history is provided by the patient.  Rash Location:  Shoulder/arm Shoulder/arm rash location:  L forearm Quality: blistering, burning and redness   Severity:  Mild Onset quality:  Gradual Duration:  2 days Progression:  Worsening Chronicity:  New Context: insect bite/sting   Relieved by:  None tried Worsened by:  Nothing tried Ineffective treatments:  None tried Associated symptoms: no fever and no myalgias     Past Medical History  Diagnosis Date  . Hypertension   . Hepatitis C    Past Surgical History  Procedure Laterality Date  . Liver biopsy    . Liver biopsy    . Left and right heart catheterization with coronary angiogram N/A 06/23/2013    Procedure: LEFT AND RIGHT HEART CATHETERIZATION WITH CORONARY ANGIOGRAM;  Surgeon: Birdie Riddle, MD;  Location: [facility];  Service: Cardiovascular;  Laterality: N/A;   Family History  Problem Relation Age of Onset  . Hypertension Mother   . Hyperlipidemia Mother   . Diabetes Maternal Aunt    History  Substance Use Topics  . Smoking status: Current Every Day Smoker  . Smokeless tobacco: Not on file  . Alcohol Use: Yes   OB History    No data available     Review of Systems  Constitutional: Negative.  Negative for fever.  Musculoskeletal: Negative for myalgias.  Skin: Positive for rash and wound.    Allergies  Review of patient's allergies indicates no known allergies.  Home Medications   Prior to Admission medications   Medication Sig Start Date End Date Taking? Authorizing Provider  aspirin 81 MG chewable tablet Chew 1 tablet (81 mg total) by mouth daily. 06/24/13   Ripudeep Krystal Eaton, MD   chlordiazePOXIDE (LIBRIUM) 10 MG capsule Take 1 capsule (10 mg total) by mouth 3 (three) times daily as needed for anxiety. 01/14/14   Lance Bosch, NP  hydrALAZINE (APRESOLINE) 50 MG tablet Take 1 tablet (50 mg total) by mouth 3 (three) times daily. 12/29/13   Lance Bosch, NP  isosorbide mononitrate (IMDUR) 60 MG 24 hr tablet Take 1 tablet (60 mg total) by mouth daily. 12/29/13   Lance Bosch, NP  losartan (COZAAR) 50 MG tablet Take 1 tablet (50 mg total) by mouth daily. 04/06/14   Lance Bosch, NP  minocycline (MINOCIN,DYNACIN) 100 MG capsule Take 1 capsule (100 mg total) by mouth 2 (two) times daily. 04/08/14   Billy Fischer, MD  mupirocin cream (BACTROBAN) 2 % Apply 1 application topically 2 (two) times daily. After warm compress to arm. 04/08/14   Billy Fischer, MD  thiamine (VITAMIN B-1) 100 MG tablet Take 1 tablet (100 mg total) by mouth daily. 12/29/13   Lance Bosch, NP   BP 149/106 mmHg  Pulse 122  Temp(Src) 98.9 F (37.2 C) (Oral)  SpO2 97%  LMP 03/16/2014 Physical Exam  Constitutional: She is oriented to person, place, and time. She appears well-developed and well-nourished.  Neck: Normal range of motion. Neck supple.  Musculoskeletal: Normal range of motion.  Lymphadenopathy:    She has no cervical adenopathy.  Neurological: She is alert and oriented to person, place, and time.  Skin: Skin  is warm and dry. Rash noted. There is erythema.  Crusting vesicular lesion to volar left wrist with tender streaking erythema to forearm to elbow, no epitroch nodes noted.  Nursing note and vitals reviewed.   ED Course  Procedures (including critical care time) Labs Review Labs Reviewed - No data to display  Imaging Review No results found.   MDM   1. Acute lymphangitis of forearm        Billy Fischer, MD 04/08/14 1042

## 2014-04-18 ENCOUNTER — Telehealth: Payer: Self-pay | Admitting: Internal Medicine

## 2014-04-18 NOTE — Telephone Encounter (Signed)
Pt. Called to speak to nurse about a medication that is ready for pickup at her pharmacy that she was not expecting, pt. States that she does not know why that medication was prescribed to her because she did not request a medication change. Please f/u with pt.

## 2014-04-19 ENCOUNTER — Telehealth: Payer: Self-pay | Admitting: Emergency Medicine

## 2014-04-19 NOTE — Telephone Encounter (Signed)
Pt blood pressure medication clarified. Pt informed she is to take Losartan 50 mg tab daily instead of Lisinopril due to freq cough Verbalized understanding

## 2014-05-11 ENCOUNTER — Other Ambulatory Visit: Payer: Self-pay | Admitting: Internal Medicine

## 2014-06-20 ENCOUNTER — Other Ambulatory Visit: Payer: Self-pay | Admitting: Internal Medicine

## 2014-07-04 ENCOUNTER — Other Ambulatory Visit: Payer: Self-pay | Admitting: Internal Medicine

## 2014-09-29 ENCOUNTER — Encounter (HOSPITAL_COMMUNITY): Payer: Self-pay | Admitting: Emergency Medicine

## 2014-09-29 ENCOUNTER — Emergency Department (HOSPITAL_COMMUNITY)
Admission: EM | Admit: 2014-09-29 | Discharge: 2014-09-29 | Disposition: A | Discharge status: Home / Self Care | Payer: Self-pay | Attending: Emergency Medicine | Admitting: Emergency Medicine

## 2014-09-29 ENCOUNTER — Emergency Department (HOSPITAL_COMMUNITY): Payer: Self-pay

## 2014-09-29 DIAGNOSIS — I10 Essential (primary) hypertension: Secondary | ICD-10-CM | POA: Insufficient documentation

## 2014-09-29 DIAGNOSIS — Z8619 Personal history of other infectious and parasitic diseases: Secondary | ICD-10-CM | POA: Insufficient documentation

## 2014-09-29 DIAGNOSIS — Z9889 Other specified postprocedural states: Secondary | ICD-10-CM | POA: Insufficient documentation

## 2014-09-29 DIAGNOSIS — Z79899 Other long term (current) drug therapy: Secondary | ICD-10-CM | POA: Insufficient documentation

## 2014-09-29 DIAGNOSIS — R079 Chest pain, unspecified: Secondary | ICD-10-CM | POA: Insufficient documentation

## 2014-09-29 DIAGNOSIS — Z7982 Long term (current) use of aspirin: Secondary | ICD-10-CM | POA: Insufficient documentation

## 2014-09-29 DIAGNOSIS — Z720 Tobacco use: Secondary | ICD-10-CM | POA: Insufficient documentation

## 2014-09-29 DIAGNOSIS — R0602 Shortness of breath: Secondary | ICD-10-CM | POA: Insufficient documentation

## 2014-09-29 DIAGNOSIS — K219 Gastro-esophageal reflux disease without esophagitis: Secondary | ICD-10-CM | POA: Insufficient documentation

## 2014-09-29 LAB — BASIC METABOLIC PANEL
Anion gap: 9 (ref 5–15)
BUN: 12 mg/dL (ref 6–20)
CALCIUM: 9.4 mg/dL (ref 8.9–10.3)
CO2: 26 mmol/L (ref 22–32)
CREATININE: 0.68 mg/dL (ref 0.44–1.00)
Chloride: 103 mmol/L (ref 101–111)
Glucose, Bld: 100 mg/dL — ABNORMAL HIGH (ref 65–99)
POTASSIUM: 4.4 mmol/L (ref 3.5–5.1)
Sodium: 138 mmol/L (ref 135–145)

## 2014-09-29 LAB — BRAIN NATRIURETIC PEPTIDE: B Natriuretic Peptide: 26.1 pg/mL (ref 0.0–100.0)

## 2014-09-29 LAB — CBC
HCT: 38.6 % (ref 36.0–46.0)
Hemoglobin: 13 g/dL (ref 12.0–15.0)
MCH: 28.4 pg (ref 26.0–34.0)
MCHC: 33.7 g/dL (ref 30.0–36.0)
MCV: 84.3 fL (ref 78.0–100.0)
Platelets: 231 10*3/uL (ref 150–400)
RBC: 4.58 MIL/uL (ref 3.87–5.11)
RDW: 13.8 % (ref 11.5–15.5)
WBC: 8 10*3/uL (ref 4.0–10.5)

## 2014-09-29 LAB — I-STAT TROPONIN, ED: TROPONIN I, POC: 0 ng/mL (ref 0.00–0.08)

## 2014-09-29 MED ORDER — NITROGLYCERIN 0.4 MG SL SUBL
0.4000 mg | SUBLINGUAL_TABLET | SUBLINGUAL | Status: DC | PRN
  Administered 2014-09-29: 0.4 mg via SUBLINGUAL
  Filled 2014-09-29: qty 1

## 2014-09-29 MED ORDER — OMEPRAZOLE 20 MG PO CPDR: 20.0000 mg | DELAYED_RELEASE_CAPSULE | Freq: Every day | ORAL | Status: DC

## 2014-09-29 MED ORDER — GI COCKTAIL ~~LOC~~
30.0000 mL | Freq: Once | ORAL | Status: AC
  Administered 2014-09-29: 30 mL via ORAL
  Filled 2014-09-29: qty 30

## 2014-09-29 MED ORDER — ASPIRIN 81 MG PO CHEW
243.0000 mg | CHEWABLE_TABLET | Freq: Once | ORAL | Status: AC
  Administered 2014-09-29: 243 mg via ORAL
  Filled 2014-09-29: qty 3

## 2014-09-29 NOTE — ED Provider Notes (Signed)
CSN: [id number]     Arrival date & time 09/29/14  1615 History   First MD Initiated Contact with Patient 09/29/14 1649     Chief Complaint  Patient presents with  . Chest Pain   [name] L Koziol is a 39 y.o. female with a history of CHF, HTN, Hep C, and alcoholic cardiomyopathy who presents to the ED complaining of left-sided nonradiating chest pain since last night that resolved and then returned again when she woke up this morning. The patient is complaining of 7 out of 10 left-sided chest pain that she describes as pressure. She is unable to identify any alleviating or aggravating factors. Patient also reports some slight shortness of breath. Patient reports taking baby aspirin today. The patient sees cardiologist Dr. Doylene Canard. Patient has had a previous cardiac cath in 07/2013 with clean arteries and no stent placement. Her echo in 2015 showed a EF of 25-30%. She denies personal or close family history of MI. Patient denies personal or family history of DVTs or PEs. Patient denies personal or close family history of blood clotting disorders such as factor V leiden, protein C or S deficiency. Patient denies fevers, chills, sweating, headache, lightheadedness, dizziness, cough, wheezing, palpitations, leg pain, leg swelling, lightheadedness, dizziness, abdominal pain, nausea, vomiting or urinary symptoms.  (Consider location/radiation/quality/duration/timing/severity/associated sxs/prior Treatment) HPI  Past Medical History  Diagnosis Date  . Hypertension   . Hepatitis C    Past Surgical History  Procedure Laterality Date  . Liver biopsy    . Liver biopsy    . Left and right heart catheterization with coronary angiogram N/A 06/23/2013    Procedure: LEFT AND RIGHT HEART CATHETERIZATION WITH CORONARY ANGIOGRAM;  Surgeon: Birdie Riddle, MD;  Location: [facility];  Service: Cardiovascular;  Laterality: N/A;   Family History  Problem Relation Age of Onset  . Hypertension Mother   .  Hyperlipidemia Mother   . Diabetes Maternal Aunt    History  Substance Use Topics  . Smoking status: Current Every Day Smoker -- 0.25 packs/day    Types: Cigarettes  . Smokeless tobacco: Not on file  . Alcohol Use: Yes   OB History    No data available     Review of Systems  Constitutional: Negative for fever and chills.  HENT: Negative for congestion and sore throat.   Eyes: Negative for visual disturbance.  Respiratory: Positive for shortness of breath. Negative for cough and wheezing.   Cardiovascular: Positive for chest pain. Negative for palpitations and leg swelling.  Gastrointestinal: Negative for nausea, vomiting, abdominal pain and diarrhea.  Genitourinary: Negative for dysuria.  Musculoskeletal: Negative for back pain and neck pain.  Skin: Negative for rash.  Neurological: Negative for dizziness, syncope, weakness, light-headedness and headaches.      Allergies  Review of patient's allergies indicates no known allergies.  Home Medications   Prior to Admission medications   Medication Sig Start Date End Date Taking? Authorizing Provider  aspirin 81 MG chewable tablet Chew 1 tablet (81 mg total) by mouth daily. 06/24/13  Yes Ripudeep Krystal Eaton, MD  hydrALAZINE (APRESOLINE) 50 MG tablet TAKE 1 TABLET BY MOUTH 3 TIMES DAILY. 05/13/14  Yes Lance Bosch, NP  losartan (COZAAR) 50 MG tablet Take 1 tablet (50 mg total) by mouth daily. 04/06/14  Yes Lance Bosch, NP  Multiple Vitamins-Minerals (MULTIVITAMIN & MINERAL PO) Take 1 tablet by mouth daily.   Yes Historical Provider, MD  vitamin B-12 (CYANOCOBALAMIN) 100 MCG tablet Take 100 mcg by  mouth daily.   Yes Historical Provider, MD  isosorbide mononitrate (IMDUR) 60 MG 24 hr tablet Take 1 tablet (60 mg total) by mouth daily. Patient not taking: Reported on 09/29/2014 12/29/13   Lance Bosch, NP  minocycline (MINOCIN,DYNACIN) 100 MG capsule Take 1 capsule (100 mg total) by mouth 2 (two) times daily. Patient not taking: Reported  on 09/29/2014 04/08/14   Billy Fischer, MD  mupirocin cream (BACTROBAN) 2 % Apply 1 application topically 2 (two) times daily. After warm compress to arm. Patient not taking: Reported on 09/29/2014 04/08/14   Billy Fischer, MD  omeprazole (PRILOSEC) 20 MG capsule Take 1 capsule (20 mg total) by mouth daily. 09/29/14   Waynetta Pean, PA-C  thiamine (VITAMIN B-1) 100 MG tablet Take 1 tablet (100 mg total) by mouth daily. Patient not taking: Reported on 09/29/2014 12/29/13   Lance Bosch, NP   BP 187/110 mmHg  Pulse 73  Temp(Src) 97.9 F (36.6 C) (Oral)  Resp 14  SpO2 100%  LMP 09/16/2014 Physical Exam  Constitutional: She is oriented to person, place, and time. She appears well-developed and well-nourished. No distress.  Nontoxic appearing.  HENT:  Head: Normocephalic and atraumatic.  Mouth/Throat: Oropharynx is clear and moist.  Eyes: Conjunctivae are normal. Pupils are equal, round, and reactive to light. Right eye exhibits no discharge. Left eye exhibits no discharge.  Neck: Neck supple. No JVD present. No tracheal deviation present.  Cardiovascular: Normal rate, regular rhythm, normal heart sounds and intact distal pulses.  Exam reveals no gallop and no friction rub.   No murmur heard. Bilateral radial, posterior tibialis and dorsalis pedis pulses are intact.    Pulmonary/Chest: Effort normal and breath sounds normal. No respiratory distress. She has no wheezes. She has no rales. She exhibits no tenderness.  Lungs are clear to auscultation bilaterally. No chest tenderness.  Abdominal: Soft. She exhibits no distension. There is no tenderness.  Musculoskeletal: She exhibits no edema or tenderness.  No lower extremity edema or tenderness.  Lymphadenopathy:    She has no cervical adenopathy.  Neurological: She is alert and oriented to person, place, and time. Coordination normal.  Skin: Skin is warm and dry. No rash noted. She is not diaphoretic. No erythema. No pallor.  Psychiatric: She has  a normal mood and affect. Her behavior is normal.  Nursing note and vitals reviewed.   ED Course  Procedures (including critical care time) Labs Review Labs Reviewed  BASIC METABOLIC PANEL - Abnormal; Notable for the following:    Glucose, Bld 100 (*)    All other components within normal limits  CBC  BRAIN NATRIURETIC PEPTIDE  I-STAT TROPOININ, ED    Imaging Review Dg Chest 2 View  09/29/2014   CLINICAL DATA:  Left-sided chest pain and shortness of breath.  EXAM: CHEST  2 VIEW  COMPARISON:  12/25/2013  FINDINGS: The heart size and mediastinal contours are within normal limits. Both lungs are clear. The visualized skeletal structures are unremarkable.  IMPRESSION: No active cardiopulmonary disease.   Electronically Signed   By: Kerby Moors M.D.   On: 09/29/2014 17:00     EKG Interpretation   Date/Time:  Thursday September 29 2014 16:33:14 EDT Ventricular Rate:  69 PR Interval:  146 QRS Duration: 80 QT Interval:  442 QTC Calculation: 473 R Axis:   46 Text Interpretation:  Sinus rhythm Probable anteroseptal infarct, old  since last tracing no significant change Confirmed by [facility]  MD, ELLIOTT  [phone]) on 09/29/2014 8:30:48  PM      Filed Vitals:   09/29/14 2015 09/29/14 2030 09/29/14 2045 09/29/14 2100  BP:  180/111  187/110  Pulse: 76 76 78 73  Temp:      TempSrc:      Resp: 19 15 19 14   SpO2: 99% 98% 100% 100%     MDM   Meds given in ED:  Medications  aspirin chewable tablet 243 mg (243 mg Oral Given 09/29/14 1728)  gi cocktail (Maalox,Lidocaine,Donnatal) (30 mLs Oral Given 09/29/14 1844)    Discharge Medication List as of 09/29/2014  9:14 PM    START taking these medications   Details  omeprazole (PRILOSEC) 20 MG capsule Take 1 capsule (20 mg total) by mouth daily., Starting 09/29/2014, Until Discontinued, Print        Final diagnoses:  Chest pain, unspecified chest pain type  Gastroesophageal reflux disease, esophagitis presence not specified  Essential  hypertension   18:40 At reevaluation the patient reports 3/10 chest pain which has improved. She denies headache or lightheadedness.  At second revaluation the patient reports her chest pain completely resolved with GI cocktail.   Patient is to be discharged with recommendation to follow up with PCP in regards to today's hospital visit. Chest pain is not likely of cardiac or pulmonary etiology d/t presentation, perc negative, VSS, no tracheal deviation, no JVD or new murmur, RRR, breath sounds equal bilaterally, EKG without acute abnormalities, negative troponin, and negative CXR. Patient also had a cardiac cath with clean coronary arteries one year ago. Pt has been advised start a PPI and return to the ED if CP becomes exertional, associated with diaphoresis or nausea, radiates to left jaw/arm, worsens or becomes concerning in any way. Pt appears reliable for follow up and is agreeable to discharge. I advised the patient to follow-up with their primary care provider this week for further management of hypertension. I advised the patient to return to the emergency department with new or worsening symptoms or new concerns. The patient verbalized understanding and agreement with plan.    This patient was discussed with Dr. Eulis Foster who agrees with assessment and plan.    Waynetta Pean, PA-C 09/30/14 0120  Daleen Bo, MD 10/01/14 [phone]

## 2014-09-29 NOTE — Discharge Instructions (Signed)
Chest Pain (Nonspecific) °It is often hard to give a specific diagnosis for the cause of chest pain. There is always a chance that your pain could be related to something serious, such as a heart attack or a blood clot in the lungs. You need to follow up with your health care provider for further evaluation. °CAUSES  °· Heartburn. °· Pneumonia or bronchitis. °· Anxiety or stress. °· Inflammation around your heart (pericarditis) or lung (pleuritis or pleurisy). °· A blood clot in the lung. °· A collapsed lung (pneumothorax). It can develop suddenly on its own (spontaneous pneumothorax) or from trauma to the chest. °· Shingles infection (herpes zoster virus). °The chest wall is composed of bones, muscles, and cartilage. Any of these can be the source of the pain. °· The bones can be bruised by injury. °· The muscles or cartilage can be strained by coughing or overwork. °· The cartilage can be affected by inflammation and become sore (costochondritis). °DIAGNOSIS  °Lab tests or other studies may be needed to find the cause of your pain. Your health care provider may have you take a test called an ambulatory electrocardiogram (ECG). An ECG records your heartbeat patterns over a 24-hour period. You may also have other tests, such as: °· Transthoracic echocardiogram (TTE). During echocardiography, sound waves are used to evaluate how blood flows through your heart. °· Transesophageal echocardiogram (TEE). °· Cardiac monitoring. This allows your health care provider to monitor your heart rate and rhythm in real time. °· Holter monitor. This is a portable device that records your heartbeat and can help diagnose heart arrhythmias. It allows your health care provider to track your heart activity for several days, if needed. °· Stress tests by exercise or by giving medicine that makes the heart beat faster. °TREATMENT  °· Treatment depends on what may be causing your chest pain. Treatment may include: °· Acid blockers for  heartburn. °· Anti-inflammatory medicine. °· Pain medicine for inflammatory conditions. °· Antibiotics if an infection is present. °· You may be advised to change lifestyle habits. This includes stopping smoking and avoiding alcohol, caffeine, and chocolate. °· You may be advised to keep your head raised (elevated) when sleeping. This reduces the chance of acid going backward from your stomach into your esophagus. °Most of the time, nonspecific chest pain will improve within 2-3 days with rest and mild pain medicine.  °HOME CARE INSTRUCTIONS  °· If antibiotics were prescribed, take them as directed. Finish them even if you start to feel better. °· For the next few days, avoid physical activities that bring on chest pain. Continue physical activities as directed. °· Do not use any tobacco products, including cigarettes, chewing tobacco, or electronic cigarettes. °· Avoid drinking alcohol. °· Only take medicine as directed by your health care provider. °· Follow your health care provider's suggestions for further testing if your chest pain does not go away. °· Keep any follow-up appointments you made. If you do not go to an appointment, you could develop lasting (chronic) problems with pain. If there is any problem keeping an appointment, call to reschedule. °SEEK MEDICAL CARE IF:  °· Your chest pain does not go away, even after treatment. °· You have a rash with blisters on your chest. °· You have a fever. °SEEK IMMEDIATE MEDICAL CARE IF:  °· You have increased chest pain or pain that spreads to your arm, neck, jaw, back, or abdomen. °· You have shortness of breath. °· You have an increasing cough, or you cough   up blood.  You have severe back or abdominal pain.  You feel nauseous or vomit.  You have severe weakness.  You faint.  You have chills. This is an emergency. Do not wait to see if the pain will go away. Get medical help at once. Call your local emergency services (911 in [place] Do not drive  yourself to the hospital. MAKE SURE YOU:   Understand these instructions.  Will watch your condition.  Will get help right away if you are not doing well or get worse. Document Released: 01/23/2005 Document Revised: 04/20/2013 Document Reviewed: 11/19/2007 [facility] Patient Information 2015 [place], [place]. This information is not intended to replace advice given to you by your health care provider. Make sure you discuss any questions you have with your health care provider. DASH Eating Plan DASH stands for "Dietary Approaches to Stop Hypertension." The DASH eating plan is a healthy eating plan that has been shown to reduce high blood pressure (hypertension). Additional health benefits may include reducing the risk of type 2 diabetes mellitus, heart disease, and stroke. The DASH eating plan may also help with weight loss. WHAT DO I NEED TO KNOW ABOUT THE DASH EATING PLAN? For the DASH eating plan, you will follow these general guidelines:  Choose foods with a percent daily value for sodium of less than 5% (as listed on the food label).  Use salt-free seasonings or herbs instead of table salt or sea salt.  Check with your health care provider or pharmacist before using salt substitutes.  Eat lower-sodium products, often labeled as "lower sodium" or "no salt added."  Eat fresh foods.  Eat more vegetables, fruits, and low-fat dairy products.  Choose whole grains. Look for the word "whole" as the first word in the ingredient list.  Choose fish and skinless chicken or [place] more often than red meat. Limit fish, poultry, and meat to 6 oz (170 g) each day.  Limit sweets, desserts, sugars, and sugary drinks.  Choose heart-healthy fats.  Limit cheese to 1 oz (28 g) per day.  Eat more home-cooked food and less restaurant, buffet, and fast food.  Limit fried foods.  Cook foods using methods other than frying.  Limit canned vegetables. If you do use them, rinse them well to decrease  the sodium.  When eating at a restaurant, ask that your food be prepared with less salt, or no salt if possible. WHAT FOODS CAN I EAT? Seek help from a dietitian for individual calorie needs. Grains Whole grain or whole wheat bread. Brown rice. Whole grain or whole wheat pasta. Quinoa, bulgur, and whole grain cereals. Low-sodium cereals. Corn or whole wheat flour tortillas. Whole grain cornbread. Whole grain crackers. Low-sodium crackers. Vegetables Fresh or frozen vegetables (raw, steamed, roasted, or grilled). Low-sodium or reduced-sodium tomato and vegetable juices. Low-sodium or reduced-sodium tomato sauce and paste. Low-sodium or reduced-sodium canned vegetables.  Fruits All fresh, canned (in natural juice), or frozen fruits. Meat and Other Protein Products Ground beef (85% or leaner), grass-fed beef, or beef trimmed of fat. Skinless chicken or [place]. Ground chicken or [place]. Pork trimmed of fat. All fish and seafood. Eggs. Dried beans, peas, or lentils. Unsalted nuts and seeds. Unsalted canned beans. Dairy Low-fat dairy products, such as skim or 1% milk, 2% or reduced-fat cheeses, low-fat ricotta or cottage cheese, or plain low-fat yogurt. Low-sodium or reduced-sodium cheeses. Fats and Oils Tub margarines without trans fats. Light or reduced-fat mayonnaise and salad dressings (reduced sodium). Avocado. Safflower, olive, or canola oils. Natural  peanut or almond butter. Other Unsalted popcorn and pretzels. The items listed above may not be a complete list of recommended foods or beverages. Contact your dietitian for more options. WHAT FOODS ARE NOT RECOMMENDED? Grains White bread. White pasta. White rice. Refined cornbread. Bagels and croissants. Crackers that contain trans fat. Vegetables Creamed or fried vegetables. Vegetables in a cheese sauce. Regular canned vegetables. Regular canned tomato sauce and paste. Regular tomato and vegetable juices. Fruits Dried fruits. Canned fruit in  light or heavy syrup. Fruit juice. Meat and Other Protein Products Fatty cuts of meat. Ribs, chicken wings, bacon, sausage, bologna, salami, chitterlings, fatback, hot dogs, bratwurst, and packaged luncheon meats. Salted nuts and seeds. Canned beans with salt. Dairy Whole or 2% milk, cream, half-and-half, and cream cheese. Whole-fat or sweetened yogurt. Full-fat cheeses or blue cheese. Nondairy creamers and whipped toppings. Processed cheese, cheese spreads, or cheese curds. Condiments Onion and garlic salt, seasoned salt, table salt, and sea salt. Canned and packaged gravies. Worcestershire sauce. Tartar sauce. Barbecue sauce. Teriyaki sauce. Soy sauce, including reduced sodium. Steak sauce. Fish sauce. Oyster sauce. Cocktail sauce. Horseradish. Ketchup and mustard. Meat flavorings and tenderizers. Bouillon cubes. Hot sauce. Tabasco sauce. Marinades. Taco seasonings. Relishes. Fats and Oils Butter, stick margarine, lard, shortening, ghee, and bacon fat. Coconut, palm kernel, or palm oils. Regular salad dressings. Other Pickles and olives. Salted popcorn and pretzels. The items listed above may not be a complete list of foods and beverages to avoid. Contact your dietitian for more information. WHERE CAN I FIND MORE INFORMATION? National Heart, Lung, and [employer] Institute: travelstabloid.com Document Released: 04/04/2011 Document Revised: 08/30/2013 Document Reviewed: 02/17/2013 [facility] Patient Information 2015 [place], [place]. This information is not intended to replace advice given to you by your health care provider. Make sure you discuss any questions you have with your health care provider. Hypertension Hypertension, commonly called high blood pressure, is when the force of blood pumping through your arteries is too strong. Your arteries are the blood vessels that carry blood from your heart throughout your body. A blood pressure reading consists of a higher  number over a lower number, such as 110/72. The higher number (systolic) is the pressure inside your arteries when your heart pumps. The lower number (diastolic) is the pressure inside your arteries when your heart relaxes. Ideally you want your blood pressure below 120/80. Hypertension forces your heart to work harder to pump blood. Your arteries may become narrow or stiff. Having hypertension puts you at risk for heart disease, stroke, and other problems.  RISK FACTORS Some risk factors for high blood pressure are controllable. Others are not.  Risk factors you cannot control include:   Race. You may be at higher risk if you are African American.  Age. Risk increases with age.  Gender. Men are at higher risk than women before age 73 years. After age 49, women are at higher risk than men. Risk factors you can control include:  Not getting enough exercise or physical activity.  Being overweight.  Getting too much fat, sugar, calories, or salt in your diet.  Drinking too much alcohol. SIGNS AND SYMPTOMS Hypertension does not usually cause signs or symptoms. Extremely high blood pressure (hypertensive crisis) may cause headache, anxiety, shortness of breath, and nosebleed. DIAGNOSIS  To check if you have hypertension, your health care provider will measure your blood pressure while you are seated, with your arm held at the level of your heart. It should be measured at least twice using the same  arm. Certain conditions can cause a difference in blood pressure between your right and left arms. A blood pressure reading that is higher than normal on one occasion does not mean that you need treatment. If one blood pressure reading is high, ask your health care provider about having it checked again. TREATMENT  Treating high blood pressure includes making lifestyle changes and possibly taking medicine. Living a healthy lifestyle can help lower high blood pressure. You may need to change some of your  habits. Lifestyle changes may include:  Following the DASH diet. This diet is high in fruits, vegetables, and whole grains. It is low in salt, red meat, and added sugars.  Getting at least 2 hours of brisk physical activity every week.  Losing weight if necessary.  Not smoking.  Limiting alcoholic beverages.  Learning ways to reduce stress. If lifestyle changes are not enough to get your blood pressure under control, your health care provider may prescribe medicine. You may need to take more than one. Work closely with your health care provider to understand the risks and benefits. HOME CARE INSTRUCTIONS  Have your blood pressure rechecked as directed by your health care provider.   Take medicines only as directed by your health care provider. Follow the directions carefully. Blood pressure medicines must be taken as prescribed. The medicine does not work as well when you skip doses. Skipping doses also puts you at risk for problems.   Do not smoke.   Monitor your blood pressure at home as directed by your health care provider. SEEK MEDICAL CARE IF:   You think you are having a reaction to medicines taken.  You have recurrent headaches or feel dizzy.  You have swelling in your ankles.  You have trouble with your vision. SEEK IMMEDIATE MEDICAL CARE IF:  You develop a severe headache or confusion.  You have unusual weakness, numbness, or feel faint.  You have severe chest or abdominal pain.  You vomit repeatedly.  You have trouble breathing. MAKE SURE YOU:   Understand these instructions.  Will watch your condition.  Will get help right away if you are not doing well or get worse. Document Released: 04/15/2005 Document Revised: 08/30/2013 Document Reviewed: 02/05/2013 [facility] Patient Information 2015 [place], [place]. This information is not intended to replace advice given to you by your health care provider. Make sure you discuss any questions you have with  your health care provider. Gastroesophageal Reflux Disease, Adult Gastroesophageal reflux disease (GERD) happens when acid from your stomach flows up into the esophagus. When acid comes in contact with the esophagus, the acid causes soreness (inflammation) in the esophagus. Over time, GERD may create small holes (ulcers) in the lining of the esophagus. CAUSES   Increased body weight. This puts pressure on the stomach, making acid rise from the stomach into the esophagus.  Smoking. This increases acid production in the stomach.  Drinking alcohol. This causes decreased pressure in the lower esophageal sphincter (valve or ring of muscle between the esophagus and stomach), allowing acid from the stomach into the esophagus.  Late evening meals and a full stomach. This increases pressure and acid production in the stomach.  A malformed lower esophageal sphincter. Sometimes, no cause is found. SYMPTOMS   Burning pain in the lower part of the mid-chest behind the breastbone and in the mid-stomach area. This may occur twice a week or more often.  Trouble swallowing.  Sore throat.  Dry cough.  Asthma-like symptoms including chest tightness, shortness of breath,  or wheezing. DIAGNOSIS  Your caregiver may be able to diagnose GERD based on your symptoms. In some cases, X-rays and other tests may be done to check for complications or to check the condition of your stomach and esophagus. TREATMENT  Your caregiver may recommend over-the-counter or prescription medicines to help decrease acid production. Ask your caregiver before starting or adding any new medicines.  HOME CARE INSTRUCTIONS   Change the factors that you can control. Ask your caregiver for guidance concerning weight loss, quitting smoking, and alcohol consumption.  Avoid foods and drinks that make your symptoms worse, such as:  Caffeine or alcoholic drinks.  Chocolate.  Peppermint or mint flavorings.  Garlic and  onions.  Spicy foods.  Citrus fruits, such as oranges, lemons, or limes.  Tomato-based foods such as sauce, chili, salsa, and pizza.  Fried and fatty foods.  Avoid lying down for the 3 hours prior to your bedtime or prior to taking a nap.  Eat small, frequent meals instead of large meals.  Wear loose-fitting clothing. Do not wear anything tight around your waist that causes pressure on your stomach.  Raise the head of your bed 6 to 8 inches with wood blocks to help you sleep. Extra pillows will not help.  Only take over-the-counter or prescription medicines for pain, discomfort, or fever as directed by your caregiver.  Do not take aspirin, ibuprofen, or other nonsteroidal anti-inflammatory drugs (NSAIDs). SEEK IMMEDIATE MEDICAL CARE IF:   You have pain in your arms, neck, jaw, teeth, or back.  Your pain increases or changes in intensity or duration.  You develop nausea, vomiting, or sweating (diaphoresis).  You develop shortness of breath, or you faint.  Your vomit is green, yellow, black, or looks like coffee grounds or blood.  Your stool is red, bloody, or black. These symptoms could be signs of other problems, such as heart disease, gastric bleeding, or esophageal bleeding. MAKE SURE YOU:   Understand these instructions.  Will watch your condition.  Will get help right away if you are not doing well or get worse. Document Released: 01/23/2005 Document Revised: 07/08/2011 Document Reviewed: 11/02/2010 [facility] Patient Information 2015 [place], [place]. This information is not intended to replace advice given to you by your health care provider. Make sure you discuss any questions you have with your health care provider.

## 2014-09-29 NOTE — ED Notes (Signed)
Pt with Hx of CHF and HTN c/o left chest pain onset yesterday at 1600, worsened today, and SOB.

## 2014-10-11 ENCOUNTER — Other Ambulatory Visit: Payer: Self-pay | Admitting: Internal Medicine

## 2014-10-19 ENCOUNTER — Ambulatory Visit: Payer: Self-pay | Admitting: Internal Medicine

## 2015-04-17 NOTE — Telephone Encounter (Signed)
Called patient to let her know that her refill request for hydralazine had been denied and that she needs to schedule follow up for hypertension with Chari Manning, NP.  I was unable to reach her due to the line being busy. Will attempt again later today.   Nicoletta Ba, PharmD, BCPS, [facility] [phone]

## 2017-07-28 ENCOUNTER — Observation Stay (HOSPITAL_BASED_OUTPATIENT_CLINIC_OR_DEPARTMENT_OTHER): Payer: Self-pay

## 2017-07-28 ENCOUNTER — Encounter (HOSPITAL_COMMUNITY): Payer: Self-pay | Admitting: Oncology

## 2017-07-28 ENCOUNTER — Emergency Department (HOSPITAL_COMMUNITY): Payer: Self-pay

## 2017-07-28 ENCOUNTER — Observation Stay (HOSPITAL_COMMUNITY)
Admission: EM | Admit: 2017-07-28 | Discharge: 2017-07-29 | Disposition: A | Discharge status: Home / Self Care | Payer: Self-pay | Attending: Internal Medicine | Admitting: Internal Medicine

## 2017-07-28 DIAGNOSIS — I340 Nonrheumatic mitral (valve) insufficiency: Secondary | ICD-10-CM

## 2017-07-28 DIAGNOSIS — F1099 Alcohol use, unspecified with unspecified alcohol-induced disorder: Secondary | ICD-10-CM

## 2017-07-28 DIAGNOSIS — I5020 Unspecified systolic (congestive) heart failure: Secondary | ICD-10-CM | POA: Insufficient documentation

## 2017-07-28 DIAGNOSIS — R809 Proteinuria, unspecified: Secondary | ICD-10-CM

## 2017-07-28 DIAGNOSIS — I5021 Acute systolic (congestive) heart failure: Secondary | ICD-10-CM

## 2017-07-28 DIAGNOSIS — I428 Other cardiomyopathies: Secondary | ICD-10-CM | POA: Insufficient documentation

## 2017-07-28 DIAGNOSIS — I43 Cardiomyopathy in diseases classified elsewhere: Secondary | ICD-10-CM

## 2017-07-28 DIAGNOSIS — Z8249 Family history of ischemic heart disease and other diseases of the circulatory system: Secondary | ICD-10-CM

## 2017-07-28 DIAGNOSIS — I161 Hypertensive emergency: Principal | ICD-10-CM

## 2017-07-28 DIAGNOSIS — Z8619 Personal history of other infectious and parasitic diseases: Secondary | ICD-10-CM

## 2017-07-28 DIAGNOSIS — F17210 Nicotine dependence, cigarettes, uncomplicated: Secondary | ICD-10-CM

## 2017-07-28 DIAGNOSIS — Z79899 Other long term (current) drug therapy: Secondary | ICD-10-CM

## 2017-07-28 DIAGNOSIS — I110 Hypertensive heart disease with heart failure: Secondary | ICD-10-CM

## 2017-07-28 DIAGNOSIS — I351 Nonrheumatic aortic (valve) insufficiency: Secondary | ICD-10-CM

## 2017-07-28 DIAGNOSIS — R9431 Abnormal electrocardiogram [ECG] [EKG]: Secondary | ICD-10-CM | POA: Insufficient documentation

## 2017-07-28 LAB — BASIC METABOLIC PANEL
ANION GAP: 10 (ref 5–15)
BUN: 9 mg/dL (ref 6–20)
CALCIUM: 8.3 mg/dL — AB (ref 8.9–10.3)
CO2: 22 mmol/L (ref 22–32)
Chloride: 105 mmol/L (ref 101–111)
Creatinine, Ser: 0.78 mg/dL (ref 0.44–1.00)
GFR calc Af Amer: >60 mL/min (ref >60)
GLUCOSE: 148 mg/dL — AB (ref 65–99)
Potassium: 3.8 mmol/L (ref 3.5–5.1)
Sodium: 137 mmol/L (ref 135–145)

## 2017-07-28 LAB — CBC
HCT: 40.4 % (ref 36.0–46.0)
HEMOGLOBIN: 13.4 g/dL (ref 12.0–15.0)
MCH: 28.9 pg (ref 26.0–34.0)
MCHC: 33.2 g/dL (ref 30.0–36.0)
MCV: 87.1 fL (ref 78.0–100.0)
Platelets: 222 10*3/uL (ref 150–400)
RBC: 4.64 MIL/uL (ref 3.87–5.11)
RDW: 13.4 % (ref 11.5–15.5)
WBC: 5.7 10*3/uL (ref 4.0–10.5)

## 2017-07-28 LAB — TROPONIN I
TROPONIN I: 0.04 ng/mL — AB (ref <0.03)
Troponin I: 0.04 ng/mL (ref <0.03)
Troponin I: 0.03 ng/mL (ref <0.03)

## 2017-07-28 LAB — URINALYSIS, ROUTINE W REFLEX MICROSCOPIC
BILIRUBIN URINE: NEGATIVE
Glucose, UA: NEGATIVE mg/dL
Hgb urine dipstick: NEGATIVE
Ketones, ur: NEGATIVE mg/dL
LEUKOCYTES UA: NEGATIVE
NITRITE: NEGATIVE
Specific Gravity, Urine: 1.014 (ref 1.005–1.030)
pH: 6 (ref 5.0–8.0)

## 2017-07-28 LAB — I-STAT TROPONIN, ED: Troponin i, poc: 0.02 ng/mL (ref 0.00–0.08)

## 2017-07-28 LAB — ECHOCARDIOGRAM COMPLETE
Height: 62 in
WEIGHTICAEL: 2320 [oz_av]

## 2017-07-28 LAB — PHOSPHORUS: Phosphorus: 2.9 mg/dL (ref 2.5–4.6)

## 2017-07-28 LAB — MAGNESIUM: Magnesium: 1.4 mg/dL — ABNORMAL LOW (ref 1.7–2.4)

## 2017-07-28 LAB — I-STAT BETA HCG BLOOD, ED (MC, WL, AP ONLY): I-stat hCG, quantitative: <5 m[IU]/mL (ref <5)

## 2017-07-28 LAB — BRAIN NATRIURETIC PEPTIDE: B Natriuretic Peptide: 185.3 pg/mL — ABNORMAL HIGH (ref 0.0–100.0)

## 2017-07-28 MED ORDER — MAGNESIUM SULFATE 2 GM/50ML IV SOLN
2.0000 g | Freq: Once | INTRAVENOUS | Status: AC
  Administered 2017-07-28: 2 g via INTRAVENOUS
  Filled 2017-07-28: qty 50

## 2017-07-28 MED ORDER — ENOXAPARIN SODIUM 40 MG/0.4ML ~~LOC~~ SOLN
40.0000 mg | SUBCUTANEOUS | Status: DC
  Administered 2017-07-28 – 2017-07-29 (×2): 40 mg via SUBCUTANEOUS
  Filled 2017-07-28 (×2): qty 0.4

## 2017-07-28 MED ORDER — ACETAMINOPHEN 325 MG PO TABS
650.0000 mg | ORAL_TABLET | Freq: Four times a day (QID) | ORAL | Status: DC | PRN
  Administered 2017-07-28 – 2017-07-29 (×2): 650 mg via ORAL
  Filled 2017-07-28 (×2): qty 2

## 2017-07-28 MED ORDER — NICOTINE 14 MG/24HR TD PT24
14.0000 mg | MEDICATED_PATCH | Freq: Every day | TRANSDERMAL | Status: DC
  Administered 2017-07-28 – 2017-07-29 (×2): 14 mg via TRANSDERMAL
  Filled 2017-07-28 (×2): qty 1

## 2017-07-28 MED ORDER — HYDRALAZINE HCL 20 MG/ML IJ SOLN
5.0000 mg | Freq: Once | INTRAMUSCULAR | Status: AC
  Administered 2017-07-28: 5 mg via INTRAVENOUS
  Filled 2017-07-28: qty 1

## 2017-07-28 MED ORDER — NITROPRUSSIDE SODIUM 25 MG/ML IV SOLN
0.0000 ug/kg/min | Freq: Once | INTRAVENOUS | Status: AC
  Administered 2017-07-28: 0.3 ug/kg/min via INTRAVENOUS
  Filled 2017-07-28: qty 2

## 2017-07-28 MED ORDER — FUROSEMIDE 10 MG/ML IJ SOLN
20.0000 mg | Freq: Once | INTRAMUSCULAR | Status: AC
  Administered 2017-07-28: 20 mg via INTRAVENOUS
  Filled 2017-07-28: qty 2

## 2017-07-28 MED ORDER — NITROGLYCERIN 2 % TD OINT
0.5000 [in_us] | TOPICAL_OINTMENT | Freq: Once | TRANSDERMAL | Status: AC
  Administered 2017-07-28: 0.5 [in_us] via TOPICAL
  Filled 2017-07-28: qty 1

## 2017-07-28 MED ORDER — LABETALOL HCL 5 MG/ML IV SOLN
5.0000 mg | Freq: Once | INTRAVENOUS | Status: AC
Start: 2017-07-28 — End: 2017-07-29
  Administered 2017-07-29: 5 mg via INTRAVENOUS
  Filled 2017-07-28: qty 4

## 2017-07-28 MED ORDER — LISINOPRIL 10 MG PO TABS
10.0000 mg | ORAL_TABLET | Freq: Every day | ORAL | Status: DC
  Administered 2017-07-28: 10 mg via ORAL
  Filled 2017-07-28: qty 1

## 2017-07-28 MED ORDER — VITAMIN B-1 100 MG PO TABS
100.0000 mg | ORAL_TABLET | Freq: Every day | ORAL | Status: DC
  Administered 2017-07-28 – 2017-07-29 (×2): 100 mg via ORAL
  Filled 2017-07-28 (×2): qty 1

## 2017-07-28 MED ORDER — ADULT MULTIVITAMIN W/MINERALS CH
1.0000 | ORAL_TABLET | Freq: Every day | ORAL | Status: DC
  Administered 2017-07-28 – 2017-07-29 (×2): 1 via ORAL
  Filled 2017-07-28 (×2): qty 1

## 2017-07-28 MED ORDER — FOLIC ACID 5 MG/ML IJ SOLN
1.0000 mg | Freq: Every day | INTRAMUSCULAR | Status: DC
  Administered 2017-07-28: 1 mg via INTRAVENOUS
  Filled 2017-07-28 (×2): qty 0.2

## 2017-07-28 MED ORDER — ALBUTEROL SULFATE (2.5 MG/3ML) 0.083% IN NEBU
5.0000 mg | INHALATION_SOLUTION | Freq: Once | RESPIRATORY_TRACT | Status: AC
  Administered 2017-07-28: 5 mg via RESPIRATORY_TRACT
  Filled 2017-07-28: qty 6

## 2017-07-28 NOTE — ED Notes (Signed)
MD contact made for update.

## 2017-07-28 NOTE — ED Notes (Addendum)
Dr. Johny Chess contacted and made aware. Of BP movement with last 166/116 though higher during eating and echo.and HR trending 108. States to continue with Lasix.

## 2017-07-28 NOTE — ED Notes (Signed)
Heart healthy dinner tray ordered 

## 2017-07-28 NOTE — ED Provider Notes (Signed)
[facility] EMERGENCY DEPARTMENT Provider Note   CSN: [id number] Arrival date & time: 07/28/17  0630     History   Chief Complaint Chief Complaint  Patient presents with  . Shortness of Breath    HPI [name] is a 42 y.o. female.  HPI  Patient presents with concern of dyspnea.  Patient notes baseline dyspnea, which is unchanged for some time. She has a history of hypertension, states that she has no access to medication, nor medical care. She has previously been advised to take antihypertensive medication. She notes that about 3 hours ago she awoke with suddenly worse difficulty breathing, but without chest pain. This new difficulty breathing was distinct from her baseline condition. Patient's dyspnea improved with supplemental oxygen provided by EMS, though she was initially intolerant of CPAP, and could only tolerate nonrebreather. She denies ongoing fever, cough, confusion, disorientation, edema.  She denies history of congestive heart failure, or known coronary disease. She is accompanied by her fianc who assists with the HPI.   Past Medical History:  Diagnosis Date  . Hepatitis C   . Hypertension     Patient Active Problem List   Diagnosis Date Noted  . Hypertensive urgency 07/28/2017  . Hypertensive crisis 06/21/2013  . KNEE PAIN, RIGHT, ACUTE 05/06/2008  . HEPATITIS C 11/02/2007  . INSOMNIA 11/02/2007  . DERMATOFIBROMA 10/19/2007  . TOBACCO ABUSE 06/18/2007  . ABSCESS, AXILLA, LEFT 06/18/2007  . HYPERTENSION 04/27/2007    Past Surgical History:  Procedure Laterality Date  . LEFT AND RIGHT HEART CATHETERIZATION WITH CORONARY ANGIOGRAM N/A 06/23/2013   Procedure: LEFT AND RIGHT HEART CATHETERIZATION WITH CORONARY ANGIOGRAM;  Surgeon: Birdie Riddle, MD;  Location: [facility];  Service: Cardiovascular;  Laterality: N/A;  . LIVER BIOPSY    . LIVER BIOPSY       OB History   None      Home Medications    Prior to Admission  medications   Medication Sig Start Date End Date Taking? Authorizing Provider  Multiple Vitamins-Minerals (MULTIVITAMIN & MINERAL PO) Take 1 tablet by mouth daily.   Yes [provider]  vitamin B-12 (CYANOCOBALAMIN) 100 MCG tablet Take 100 mcg by mouth daily.   Yes [provider]    Family History Family History  Problem Relation Age of Onset  . Hypertension Mother   . Hyperlipidemia Mother   . Diabetes Maternal Aunt     Social History Social History   Tobacco Use  . Smoking status: Current Every Day Smoker    Packs/day: 0.25    Types: Cigarettes  . Smokeless tobacco: Never Used  Substance Use Topics  . Alcohol use: Yes  . Drug use: Yes    Types: Marijuana     Allergies   Patient has no known allergies.   Review of Systems Review of Systems  Constitutional:       Per HPI, otherwise negative  HENT:       Per HPI, otherwise negative  Respiratory:       Per HPI, otherwise negative  Cardiovascular:       Per HPI, otherwise negative  Gastrointestinal: Negative for vomiting.  Endocrine:       Negative aside from HPI  Genitourinary:       Neg aside from HPI   Musculoskeletal:       Per HPI, otherwise negative  Skin: Negative.   Neurological: Negative for syncope.     Physical Exam Updated Vital Signs BP (!) 175/114  Pulse 92   Temp 98.3 F (36.8 C) (Oral)   Resp 17   Ht 5\' 2"  (1.575 m)   Wt 65.8 kg (145 lb)   LMP 07/24/2017 (Exact Date)   SpO2 97%   BMI 26.52 kg/m   Physical Exam  Constitutional: She is oriented to person, place, and time. She appears well-developed and well-nourished. No distress.  HENT:  Head: Normocephalic and atraumatic.  Eyes: Conjunctivae and EOM are normal.  Cardiovascular: Normal rate and regular rhythm.  Pulmonary/Chest: Tachypnea noted. She has decreased breath sounds.  Abdominal: She exhibits no distension.  Musculoskeletal: She exhibits no edema.  Neurological: She is alert and oriented to person,  place, and time. No cranial nerve deficit.  Skin: Skin is warm and dry.  Psychiatric: She has a normal mood and affect.  Nursing note and vitals reviewed.    ED Treatments / Results  Labs (all labs ordered are listed, but only abnormal results are displayed) Labs Reviewed  BASIC METABOLIC PANEL - Abnormal; Notable for the following components:      Result Value   Glucose, Bld 148 (*)    Calcium 8.3 (*)    All other components within normal limits  MAGNESIUM - Abnormal; Notable for the following components:   Magnesium 1.4 (*)    All other components within normal limits  BRAIN NATRIURETIC PEPTIDE - Abnormal; Notable for the following components:   B Natriuretic Peptide 185.3 (*)    All other components within normal limits  URINALYSIS, ROUTINE W REFLEX MICROSCOPIC - Abnormal; Notable for the following components:   Protein, ur >=300 (*)    Bacteria, UA RARE (*)    Squamous Epithelial / LPF 0-5 (*)    All other components within normal limits  CBC  I-STAT TROPONIN, ED  I-STAT BETA HCG BLOOD, ED ([facility], [facility], [facility] ONLY)    EMS rhythm strip with sinus tach, rate 155, ST wave changes, substantial artifact, abnormal    EKG EKG Interpretation  Date/Time:  Monday July 28 2017 06:37:15 EDT Ventricular Rate:  110 PR Interval:    QRS Duration: 88 QT Interval:  370 QTC Calculation: 501 R Axis:   -21 Text Interpretation:  Sinus tachycardia LAE, consider biatrial enlargement Left ventricular hypertrophy Nonspecific T abnormalities, lateral leads Borderline prolonged QT interval Confirmed by Veryl Speak [phone]) on 07/28/2017 6:50:48 AM   Oxygen saturation 95% room air borderline  Radiology Dg Chest 2 View  Result Date: 07/28/2017 CLINICAL DATA:  Shortness of Breath EXAM: CHEST - 2 VIEW COMPARISON:  September 29, 2014 FINDINGS: There is cardiomegaly with pulmonary venous hypertension. There is interstitial pulmonary edema. There is no appreciable airspace consolidation or effusion. No  evident adenopathy. No bone lesions. IMPRESSION: Pulmonary vascular congestion with interstitial edema. Suspect a degree of congestive heart failure. No consolidation. No adenopathy. Electronically Signed   By: Lowella Grip III M.D.   On: 07/28/2017 07:22    Procedures Procedures (including critical care time)  Medications Ordered in ED Medications  nitroPRUSSide (NIPRIDE) 50 mg in dextrose 5 % 250 mL (0.2 mg/mL) infusion (has no administration in time range)  albuterol (PROVENTIL) (2.5 MG/3ML) 0.083% nebulizer solution 5 mg (5 mg Nebulization Given 07/28/17 0647)  furosemide (LASIX) injection 20 mg (20 mg Intravenous Given 07/28/17 0815)  hydrALAZINE (APRESOLINE) injection 5 mg (5 mg Intravenous Given 07/28/17 0818)  nitroGLYCERIN (NITROGLYN) 2 % ointment 0.5 inch (0.5 inches Topical Given 07/28/17 0814)     Initial Impression / Assessment and Plan / ED Course  I have reviewed the triage vital signs and the nursing notes.  Pertinent labs & imaging results that were available during my care of the patient were reviewed by me and considered in my medical decision making (see chart for details).     After the initial evaluation, with concern for hypertensive emergency, the patient was started on nitroglycerin topical, Lasix, hydralazine.  Update:, Blood pressure remains similar to arrival.   9:40 AM Patient remains hypertensive, 1 9 0/1 3 5  in spite of initial hydralazine, Lasix, topical nitroglycerin. I discussed all findings with the patient, including concern for heart failure given abnormal x-ray, elevated BNP, and persistent hypertension. 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 Given concern for this, and her persistent blood pressure elevation in spite of hydralazine, nitroglycerin, Lasix, patient will initiate  nitroglycerin drip.  I discussed patient's case with our admitting internal medicine  team.  Final Clinical Impressions(s) / ED Diagnoses  Hypertensive crisis  CRITICAL CARE Performed by: Carmin Muskrat Total critical care time: 40 minutes Critical care time was exclusive of separately billable procedures and treating other patients. Critical care was necessary to treat or prevent imminent or life-threatening deterioration. Critical care was time spent personally by me on the following activities: development of treatment plan with patient and/or surrogate as well as nursing, discussions with consultants, evaluation of patient's response to treatment, examination of patient, obtaining history from patient or surrogate, ordering and performing treatments and interventions, ordering and review of laboratory studies, ordering and review of radiographic studies, pulse oximetry and re-evaluation of patient's condition.    Carmin Muskrat, MD 07/28/17 [phone]

## 2017-07-28 NOTE — ED Notes (Signed)
Patient up to bathroom

## 2017-07-28 NOTE — ED Notes (Signed)
Pt eating lunch and tolerating well. 

## 2017-07-28 NOTE — ED Notes (Signed)
Patient transported to floor by RN

## 2017-07-28 NOTE — H&P (Addendum)
Date: 07/28/2017               Patient Name:  [name] MRN: [id number]  DOB: 09-11-1975 Age / Sex: 42 y.o., female   PCP: Patient, No Pcp Per         Medical Service: Internal Medicine Teaching Service         Attending Physician: Dr. Aldine Contes, MD    First Contact: Dr. Johny Chess Pager: [phone]  Second Contact: Dr. Heber [place] Pager: [phone]       After Hours (After 5p/  First Contact Pager: [phone]  weekends / holidays): Second Contact Pager: [phone]   Chief Complaint: SOB  History of Present Illness: Patient is a 42 year old female with a past medical history of HTN, NICM, hep C, tobacco use presenting to the hospital with a chief complaint of shortness of breath.  She currently has no access to medications or medical care due to lack of medical insurance.  Patient states she has been feeling short of breath even when walking to a store 1 block away for the past 1 month. Her shortness of breath became worse early this morning and woke her up from her sleep around 5 AM.  She also experienced chest tightness at that time.  States the last time she took any medications was 2 years ago.  She believes she was previously on 4 different blood pressure medications and was followed by her cardiologist Dr. Doylene Canard.  She denies having any lower extremity edema or weight gain.  Reports having mild cough productive of white colored phlegm.  Denies having any fevers, chills, sore throat, rhinorrhea, nausea, vomiting, or diarrhea.  States her appetite is good.  Per patient, she used to drink heavily in the past.  At present, she is drinking a 24 ounce beer and 1/2 pint of wine daily.  States she was previously diagnosed with hepatitis C but has never been treated.  Patient dyspnea improved with nonrebreather per EMS; she could not tolerate CPAP.  On arrival, blood pressure 190/150 and MAP 152.  BNP 185.  Chest x-ray showing cardiomegaly, pulmonary vascular congestion with interstitial edema.  EKG with sinus tachycardia with heart rate 110, left atrial enlargement, left ventricular hypertrophy, and QTc 501. Non-specific T wave abnormality in V6. I-STAT troponin negative.  She received IV Lasix 20 mg, IV hydralazine 5 mg, and nitroglycerin ointment in the ED.   Meds:  Current Meds  Medication Sig  . Multiple Vitamins-Minerals (MULTIVITAMIN & MINERAL PO) Take 1 tablet by mouth daily.  . vitamin B-12 (CYANOCOBALAMIN) 100 MCG tablet Take 100 mcg by mouth daily.     Allergies: Allergies as of 07/28/2017  . (No Known Allergies)   Past Medical History:  Diagnosis Date  . Hepatitis C   . Hypertension     Family History:  Mother - hypertension, hyperlipidemia Maternal aunt - diabetes  Social History: Smokes 1/2 pack of cigarettes per day.  Drinks 24 ounce beer and 1/2 pint of wine daily.  Occasional marijuana use.  Review of Systems: A complete ROS was negative except as per HPI.  Physical Exam: Blood pressure (!) 193/136, pulse 92, temperature 98.3 F (36.8 C), temperature source Oral, resp. rate (!) 32, height 5\' 2"  (1.575 m), weight 145 lb (65.8 kg), last menstrual period 07/24/2017, SpO2 97 %. Physical Exam  Constitutional: She is oriented to person, place, and time. She appears well-developed and well-nourished. No distress.  Lying comfortably in a hospital stretcher watching the television.  HENT:  Mouth/Throat: Oropharynx is clear and moist.  Eyes: Right eye exhibits no discharge. Left eye exhibits no discharge.  Neck: JVD present.  Cardiovascular: Normal rate, regular rhythm and intact distal pulses. Exam reveals no gallop and no friction rub.  Pulmonary/Chest: Effort normal and breath sounds normal. No respiratory distress. She has no wheezes. She has no rales.  Abdominal: Soft. Bowel sounds are normal. She exhibits no distension. There is no tenderness.  Musculoskeletal: She exhibits no edema.  Neurological: She is alert and oriented to person, place, and time.    Skin: Skin is warm and dry.    EKG: personally reviewed my interpretation is sinus tachycardia with heart rate 110, left atrial enlargement, left ventricular hypertrophy, and QTc 501. Non-specific T wave abnormality in V6.   CXR: personally reviewed my interpretation is cardiomegaly, pulmonary vascular congestion with interstitial edema.  Assessment & Plan by Problem: Active Problems:   Hypertensive urgency  42 year old female with a past medical history of hypertension, nonischemic dilated alcoholic cardiomyopathy, hep C, tobacco use presenting to the hospital with hypertensive urgency.  Hypertensive emergency with acute pulmonary edema: Blood pressure significantly elevated at 190/150 and MAP 152 on arrival in the setting of lack of medical care and no medication use.  CXR showing  pulmonary vascular congestion with interstitial edema. Her progressive dyspnea on exertion could be related to worsening heart failure as per prior cardiology documentation she has nonischemic dilated alcoholic cardiomyopathy with left ventricular ejection fraction 25-30% on echo done in 2015.  BNP elevated at 185.  Blood pressure now improved to 160s/ 100s.  MAP improved to 120.  She has already received IV Lasix 20 mg, IV hydralazine 5 mg, and nitroglycerin ointment in the ED.  Lungs currently clear on exam.  Per nursing staff, patient is satting well on room air but receiving supplemental oxygen just for comfort.  She experienced chest tightness in the morning but i-STAT troponin negative.  EKG showing nonspecific T wave changes in lead V6. -Admit to stepdown -Since patient has experienced significant improvement in her dyspnea.  Goal is 25% reduction in blood pressure in the first 24 hours. Keep systolic above 557 and diastolic above 322. Would avoid hydralazine as it would increase cardiac work. Would avoid labetalol as it would decrease cardiac contractility. Will give additional IV Lasix 20 mg this afternoon. She  may need IV nitroglycerin if BP not at goal.  -Start Lisinopril 10 mg daily  -Continue to trend troponin -Echo -Repeat EKG in a.m.  Nonischemic dilated alcoholic cardiomyopathy: Echo done in February 2015 showing left ventricular ejection fraction 02-54%, grade 1 diastolic dysfunction, moderate mitral regurgitation with a mildly dilated left atrium. Per chart review, cath done in 2015 without CAD.  -Repeat echo -Monitor I&Os -Daily weights   Hx of Hep C: Patient states she has never received treatment. -Check Hep C RNA  Tobacco use: Currently smoking 1/2 pack/day.  -NicoDerm 14 mg daily patch  Alcohol abuse: Per patient, she was drinking heavily in the past.  At present, drinking 24 ounce beer and 1/2 pint of wine daily.  Magnesium 1.4. -Stepdown CIWA protocol -Replete magnesium -Check phosphorus level -Continue to monitor electrolytes including phosphorus, magnesium, calcium, and potassium. -CMP in am   Diet: Heart healthy  DVT prophylaxis: Lovenox  Code: Patient wishes to be full code.  Dispo: Admit patient to Observation with expected length of stay less than 2 midnights.  Signed: Shela Leff, MD 07/28/2017, 9:28 AM  Pager: [phone]

## 2017-07-28 NOTE — ED Triage Notes (Signed)
Pt bib EMS from home d/t shob that started last night.  Pt reported to EMS getting progressively shob.  Pt placed on CPAP by EMS however could not tolerate was changed to non rebreather.  Pt w/ hx of HTN is not compliant with medications d/t financial concerns.  Pt denies pain or other associated sx.

## 2017-07-28 NOTE — Progress Notes (Signed)
  Echocardiogram 2D Echocardiogram has been performed.  Jannett Celestine 07/28/2017, 1:51 PM

## 2017-07-28 NOTE — ED Notes (Signed)
Dr. Marlowe Sax contacted to inform of troponin

## 2017-07-28 NOTE — ED Notes (Signed)
Pt reports difficulty resting due to waking up frequently. Placed pt on 2 L for comfort.

## 2017-07-28 NOTE — ED Notes (Signed)
Rounding Physician at bedside.

## 2017-07-29 ENCOUNTER — Telehealth: Payer: Self-pay

## 2017-07-29 ENCOUNTER — Other Ambulatory Visit: Payer: Self-pay

## 2017-07-29 ENCOUNTER — Encounter: Payer: Self-pay | Admitting: Internal Medicine

## 2017-07-29 DIAGNOSIS — R9431 Abnormal electrocardiogram [ECG] [EKG]: Secondary | ICD-10-CM

## 2017-07-29 DIAGNOSIS — F17200 Nicotine dependence, unspecified, uncomplicated: Secondary | ICD-10-CM

## 2017-07-29 DIAGNOSIS — Z91120 Patient's intentional underdosing of medication regimen due to financial hardship: Secondary | ICD-10-CM

## 2017-07-29 LAB — COMPREHENSIVE METABOLIC PANEL
ALBUMIN: 3.4 g/dL — AB (ref 3.5–5.0)
ALK PHOS: 51 U/L (ref 38–126)
ALT: 45 U/L (ref 14–54)
AST: 50 U/L — AB (ref 15–41)
Anion gap: 11 (ref 5–15)
BUN: 10 mg/dL (ref 6–20)
CALCIUM: 8.8 mg/dL — AB (ref 8.9–10.3)
CHLORIDE: 100 mmol/L — AB (ref 101–111)
CO2: 25 mmol/L (ref 22–32)
CREATININE: 0.69 mg/dL (ref 0.44–1.00)
GFR calc non Af Amer: >60 mL/min (ref >60)
GLUCOSE: 98 mg/dL (ref 65–99)
Potassium: 3.8 mmol/L (ref 3.5–5.1)
SODIUM: 136 mmol/L (ref 135–145)
Total Bilirubin: 0.7 mg/dL (ref 0.3–1.2)
Total Protein: 6.4 g/dL — ABNORMAL LOW (ref 6.5–8.1)

## 2017-07-29 LAB — MAGNESIUM: MAGNESIUM: 1.9 mg/dL (ref 1.7–2.4)

## 2017-07-29 LAB — HIV ANTIBODY (ROUTINE TESTING W REFLEX): HIV Screen 4th Generation wRfx: NONREACTIVE

## 2017-07-29 LAB — MRSA PCR SCREENING: MRSA by PCR: NEGATIVE

## 2017-07-29 MED ORDER — POLYETHYLENE GLYCOL 3350 17 G PO PACK
17.0000 g | PACK | Freq: Two times a day (BID) | ORAL | Status: DC | PRN
Start: 2017-07-29 — End: 2017-07-29

## 2017-07-29 MED ORDER — LISINOPRIL 20 MG PO TABS
20.0000 mg | ORAL_TABLET | Freq: Once | ORAL | Status: AC
  Administered 2017-07-29: 20 mg via ORAL

## 2017-07-29 MED ORDER — LISINOPRIL 20 MG PO TABS: 20.0000 mg | ORAL_TABLET | Freq: Every day | ORAL | 0 refills | Status: DC

## 2017-07-29 MED ORDER — LISINOPRIL 20 MG PO TABS
20.0000 mg | ORAL_TABLET | Freq: Every day | ORAL | Status: DC
  Administered 2017-07-29: 20 mg via ORAL
  Filled 2017-07-29 (×2): qty 1

## 2017-07-29 MED ORDER — SENNA 8.6 MG PO TABS
1.0000 | ORAL_TABLET | Freq: Two times a day (BID) | ORAL | Status: DC | PRN
  Administered 2017-07-29: 17.2 mg via ORAL
  Filled 2017-07-29: qty 2

## 2017-07-29 MED ORDER — LISINOPRIL 40 MG PO TABS: 40.0000 mg | ORAL_TABLET | Freq: Every day | ORAL | 0 refills | Status: DC

## 2017-07-29 MED ORDER — CARVEDILOL 3.125 MG PO TABS
3.1250 mg | ORAL_TABLET | Freq: Two times a day (BID) | ORAL | Status: DC
  Administered 2017-07-29 (×2): 3.125 mg via ORAL
  Filled 2017-07-29 (×2): qty 1

## 2017-07-29 MED ORDER — FOLIC ACID 1 MG PO TABS
1.0000 mg | ORAL_TABLET | Freq: Every day | ORAL | Status: DC
  Administered 2017-07-29: 1 mg via ORAL
  Filled 2017-07-29: qty 1

## 2017-07-29 MED ORDER — CARVEDILOL 3.125 MG PO TABS: 3.1250 mg | ORAL_TABLET | Freq: Two times a day (BID) | ORAL | 0 refills | Status: DC

## 2017-07-29 NOTE — Plan of Care (Signed)
Discussed with patient plan of care for the evening including a consult with Social Worker to help with the costs of medications.  Patient is interested in smoking cessation.

## 2017-07-29 NOTE — Progress Notes (Signed)
MD contacted in regards to DC order as BP is still elevated.

## 2017-07-29 NOTE — Telephone Encounter (Signed)
Hospital TOC per dr Johny Chess, discharge 07/29/2017, appt 08/01/2017.

## 2017-07-29 NOTE — Care Management Note (Addendum)
Case Management Note  Patient Details  Name: [name] MRN: [id number] Date of Birth: October 04, 1975  Subjective/Objective:                 Independent patient form home, admitted w HTN and Pulm Edema. CM consulted bc patient reportedly cannot afford meds. Patient states she does not get Rx filled because she does not have Rxs as she does not go to the MD. States she has been to [facility] last in 2017. She would like to follow up there at DC. No appointment availalble at this time. Told to call back Monday. Entered on to AVS.    Action/Plan:  Patient can schedule follow up appointment.  May need [place] letter at DC, will continue to follow.  New medications on AVS are all on [facility] $4 list. Match letter not indicated.   Expected Discharge Date:                  Expected Discharge Plan:  Home/Self Care  In-House Referral:     Discharge planning Services  CM Consult  Post Acute Care Choice:    Choice offered to:     DME Arranged:    DME Agency:     HH Arranged:    HH Agency:     Status of Service:  In process, will continue to follow  If discussed at Long Length of Stay Meetings, dates discussed:    Additional Comments:  Carles Collet, RN 07/29/2017, 11:52 AM

## 2017-07-29 NOTE — Progress Notes (Signed)
CSW consulted for medication assistance- CSW does not assist with medication so informed RN Case Manager of consult  CSW signing off- please reconsult if needed  Jorge Ny, [place] Social Worker [phone]

## 2017-07-29 NOTE — Progress Notes (Signed)
Patient discharged to home with daughter, discharge instructions reviewed including $4 prescriptions and follow up appointments. Patient verbalized understanding.

## 2017-07-29 NOTE — Progress Notes (Signed)
Paged on-call physician (SBP > 180 mmHg, or DBP > 120 mmHg as needed order) because pt's BP was 171/123 (140) at 0137.   Physician said continue to monitor.

## 2017-07-29 NOTE — Progress Notes (Signed)
   Subjective: No acute events overnight, pt's systolic BP ranged from 831D-176. She states her breathing has improved and she was better able to lay flat overnight. Denies current chest pain or headache, overall feeling much better.  Objective:  Vital signs in last 24 hours: Vitals:   07/29/17 0100 07/29/17 0109 07/29/17 0137 07/29/17 0316  BP: (!) 176/102 (!) 168/114 (!) 171/123 (!) 178/119  Pulse: 89 80 79 79  Resp: 20 19 19 20   Temp:    98.1 F (36.7 C)  TempSrc:    Oral  SpO2: 98% 98% 96% 97%  Weight:    152 lb 12.5 oz (69.3 kg)  Height:       General: Laying in bed comfortably, no acute distress HEENT: Normal conjuctiva, normocephalic, atraumatic CV: RRR, no murmur appreciated  Resp: Clear breath sounds bilaterally, normal work of breathing, no distress  Abd: Soft, +BS, non-tender to palpation Extr: No LE edema, good peripheral pulses Neuro: Alert and oriented x3 Skin: Warm, dry      Assessment/Plan:  Hypertensive Emergency, Pulmonary Edema Presented with elevated blood pressure up to 160V systolic with acute shortness of breath and evidence of pulmonary edema.  She received IV Lasix, IV hydralazine, nitroglycerin ointment and nitroprusside drip briefly.  Lisinopril 10 mg was started.  Her blood pressure has improved slightly and has not been corrected too quickly.  She will likely need multiple agents and up titration of antihypertensive regimen as an outpatient, will make the changes below at this point.  Given heart failure, will start Coreg, in the future BiDil or hydralazine and isosorbide dinitrate prescribed separately is an option. --Monitor vital signs --Increase Lisinopril to 20 mg --Start Coreg 3.125 mg BID (likely will be up-titrated in future)  --Discontinue lasix for now  --Close outpatient follow up and establish PCP    HFrEF (EF 35-40%) Ejection fraction slightly improved compared to prior in 2015, nonischemic cardiomyopathy attributed to alcohol use, not  currently on any medications for the past 2 years.  Would benefit from establishing with cardiology and will tailor hypertensive regimen to include agents with mortality benefit in systolic heart failure.  Alcohol Use Has a history of heavy alcohol use and currently drinks a few drinks per evening, estimated at 24 ounces of beer, half pint of wine.  Has been monitor on [place] protocol, no abdomen dose is required.  She was counseled on the importance of alcohol cessation and its effects on cardiac function. --Cont thiamine, multivitamin   Dispo: Anticipated discharge in approximately 0-1 day(s).   Tawny Asal, MD 07/29/2017, 8:13 AM Pager: [phone]

## 2017-07-30 ENCOUNTER — Telehealth: Payer: Self-pay | Admitting: *Deleted

## 2017-07-30 LAB — HCV RNA QUANT
HCV Quantitative Log: 6.62 log10 IU/mL (ref >1.70)
HCV Quantitative: 4170000 IU/mL (ref >50)

## 2017-07-30 NOTE — Discharge Summary (Addendum)
Name: [name] MRN: [id number] DOB: [birth year] 42 y.o. PCP: Patient, No Pcp Per  Date of Admission: 07/28/2017  6:30 AM Date of Discharge: 07/29/2017 Attending Physician: Dr. Aldine Contes  Discharge Diagnosis: Active Problems:   Hypertensive emergency   Discharge Medications: Allergies as of 07/29/2017   No Known Allergies     Medication List    TAKE these medications   carvedilol 3.125 MG tablet Commonly known as:  COREG Take 1 tablet (3.125 mg total) by mouth 2 (two) times daily with a meal.   lisinopril 40 MG tablet Commonly known as:  PRINIVIL,ZESTRIL Take 1 tablet (40 mg total) by mouth daily.   MULTIVITAMIN & MINERAL PO Take 1 tablet by mouth daily.   vitamin B-12 100 MCG tablet Commonly known as:  CYANOCOBALAMIN Take 100 mcg by mouth daily.       Disposition and follow-up:   [name] was discharged from [facility] in Stable condition.  At the hospital follow up visit please address:  1.  -Assess BP and up-titrate and/or add medications as necessary. Given co-morbid HF, BiDil (or hydralazine, isosorbide dintrate prescribed separately for cost consideration) may be an option, as well as spironolactone  -Assess for chest pain and repeat EKG given abnormalities on inpatient EKG  -Assess volume status--no LE edema during admission, pulmonary edema improved prior to discharge -Consider spot urine protein: Cr for proteinuria  -Consider referral to Cardiology (previously seen by Dr. Doylene Canard) -Consider ID referral for Hep C   -Continue to encourage alcohol and tobacco cessation -Financial assistance or orange card information     2.  Labs / imaging needed at time of follow-up: Repeat EKG, BMP after re-initiation of anti-hypertensives   3.  Pending labs/ test needing follow-up: None   Follow-up Appointments: Follow-up Information    [name]. Call.   Why:  Call Monday at 08:00 to schedule a  hospital follow up appointment.  Contact information: [place] [id number] [place] Hospital Course by problem list:  Hypertensive Emergency, Pulmonary Edema She presented with acute onset of shortness of breath with elevated blood pressure up to 026 systolic with evidence of pulmonary edema on CXR.  She was previously on multiple antihypertensive medications but had not been taking any for 2 years due to financial barriers.  She received IV Lasix, IV hydralazine, nitroglycerin ointment, and a nitroprusside drip briefly.  Her blood pressure was reduced and was monitored to not exceed a greater than 25% reduction in the first 24 hours.  She was started on oral lisinopril which was up titrated to 40 mg on discharge.  Coreg at a low dose (given heart failure history and echo findings) was started and can be uptitrated as an outpatient.  She may need further antihypertensives for adequate control and BiDil may be an ideal option given heart failure and ethnicity.  A follow-up appointment was made for the [facility] clinic where she can establish care if desired.  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  Repeat echo this admission showed slight interval improvement in ejection fraction to 35-40%.  She was not discharged on a diuretic given absence of lower extremity edema and improvement in her pulmonary symptoms.  She may benefit from reestablishing with cardiology as she was previously followed by  Dr. Doylene Canard  Abnormal EKG During evaluation for hypertensive emergency, troponins were trended which were negative, initial EKG reassuring.  Prior to discharge, she was not complaining of any chest pain or shortness of breath.  On routine repeat EKG which was reviewed after the patient left the hospital, new T wave inversions in lateral  leads were noted.  On post discharge phone call for transitions of care, patient did not complain of chest pain.  Would recommend a repeat EKG on follow-up for reevaluation.  H/o Hepatitis C She has a history of hepatitis C with no prior treatment.  Hepatitis C RNA checked on admission which returned elevated at 4 million.  Would recommend ID referral as outpatient for further follow-up.  Alcohol Use She has a history of alcohol use which per her report was heavy in the past, currently drinking estimated amount of 24 ounces of beer, half pint of wine daily.  She was monitored on CIWA protocol and did not require benzodiazepine doses or complained of withdrawal symptoms.  She was counseled on the importance of alcohol cessation and potential benefits to her cardiac function and health.  Proteinuria On U/A checked on admission, proteinuria noted. Cr stayed stable within normal range with no acute decline in GFR. Hopefully this will improve with better BP control and establishing care. Could consider spot protein: Cr ratio for better quantification of proteinuria on follow up.   Discharge Vitals:   BP (!) 162/114   Pulse 82   Temp 97.7 F (36.5 C) (Oral)   Resp 18   Ht 5\' 2"  (1.575 m)   Wt 152 lb 12.5 oz (69.3 kg)   LMP 07/24/2017 (Exact Date)   SpO2 99%   BMI 27.94 kg/m   Pertinent Labs, Studies, and Procedures:  Echo:  Left ventricle: The cavity size was normal. Wall thickness was normal. Systolic function was moderately reduced. The estimated ejection fraction was in the range of 35% to 40%. Diffuse hypokinesis. Doppler parameters are consistent with abnormal left ventricular relaxation (grade 1 diastolic dysfunction). Aortic valve: There was mild to moderate regurgitation. Right ventricle: The cavity size was moderately decreased.  CXR: Pulmonary vascular congestion with interstitial edema. Suspect a degree of congestive heart failure. No consolidation. No adenopathy.  BMP Latest Ref  Rng & Units 07/29/2017 07/28/2017 09/29/2014  Glucose 65 - 99 mg/dL 98 148(H) 100(H)  BUN 6 - 20 mg/dL 10 9 12   Creatinine 0.44 - 1.00 mg/dL 0.69 0.78 0.68  Sodium 135 - 145 mmol/L 136 137 138  Potassium 3.5 - 5.1 mmol/L 3.8 3.8 4.4  Chloride 101 - 111 mmol/L 100(L) 105 103  CO2 22 - 32 mmol/L 25 22 26   Calcium 8.9 - 10.3 mg/dL 8.8(L) 8.3(L) 9.4    Discharge Instructions: Discharge Instructions    Diet - low sodium heart healthy   Complete by:  As directed    Discharge instructions   Complete by:  As directed    Nice to meet you Ms. Lovena Le.  --We are going to restart some blood pressure medicines. You may need more medications or higher doses to get your blood pressure in a normal range and help protect your heart, but we don't want to start with too much all at once.  --Take Lisinopril 20 mg daily and Coreg (or carvedilol) 3.125 mg twice a day for now. Both of these medications are on the $4 list at [facility] and were printed for you  --You have a follow up appointment this Friday 4/5 at 9:45  am at the [facility] which is here in the hospital on the ground floor. We would like to see how your blood pressure is doing and make any adjustments needed. We can also talk about the orange card and if you can meet with the financial counselor  --It will be very important for you to cut back and hopefully stop drinking alcohol as this is negatively affecting your heart.   Increase activity slowly   Complete by:  As directed       Signed: Tawny Asal, MD 07/30/2017, 2:12 PM   Pager: [phone]

## 2017-07-30 NOTE — Telephone Encounter (Signed)
Transition of care call completed in a separate encounter.[name], Patricia Cauthon Cassady4/3/201910:28 AM

## 2017-07-30 NOTE — Telephone Encounter (Signed)
Transition Care Management Follow-up Telephone Call   Date discharged? 07/29/2017   How have you been since you were released from the hospital? Still feel a little weak and tired"   Do you understand why you were in the hospital? yes   Do you understand the discharge instructions? yes   Where were you discharged to? Home   Items Reviewed:  Medications reviewed: yes  Allergies reviewed: yes  Dietary changes reviewed: yes  Referrals reviewed: N/A   Functional Questionnaire:   Activities of Daily Living (ADLs):   She states they are independent in the following: performs all ADLs independently   States they require assistance with the following: no assistance needed   Any transportation issues/concerns?: no (mother drives her)   Any patient concerns? Yes -Pt is currently uninsured and when she went to get her lisinopril ([facility])-she was informed that it would be $4 and it was $14. Pt was able to still pick it up, but would like the $4 rx next time.  Will address cost of medications at upcoming [place] visit.   Confirmed importance and date/time of follow-up visits scheduled yes  Provider Appointment booked with  Confirmed with patient if condition begins to worsen call PCP or go to the ER.  Patient was given the office number and encouraged to call back with question or concerns.  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  Stated that note needed to excuse her from work until her [place] visit on 08/01/2017.  I feel this is a reasonable request and pt was given a new work excuse until her 04/05 appt.  [name], Darlene Cassady4/3/201910:25 AM

## 2017-08-01 ENCOUNTER — Ambulatory Visit (INDEPENDENT_AMBULATORY_CARE_PROVIDER_SITE_OTHER): Payer: Self-pay | Admitting: Internal Medicine

## 2017-08-01 ENCOUNTER — Other Ambulatory Visit: Payer: Self-pay

## 2017-08-01 ENCOUNTER — Encounter: Payer: Self-pay | Admitting: Internal Medicine

## 2017-08-01 ENCOUNTER — Ambulatory Visit (HOSPITAL_COMMUNITY)
Admission: RE | Admit: 2017-08-01 | Discharge: 2017-08-01 | Disposition: A | Discharge status: Home / Self Care | Payer: Self-pay | Source: Ambulatory Visit | Attending: Family Medicine | Admitting: Family Medicine

## 2017-08-01 VITALS — BP 158/110 | HR 88 | Temp 98.4°F | Ht 62.0 in | Wt 159.6 lb

## 2017-08-01 DIAGNOSIS — Z79899 Other long term (current) drug therapy: Secondary | ICD-10-CM

## 2017-08-01 DIAGNOSIS — I43 Cardiomyopathy in diseases classified elsewhere: Secondary | ICD-10-CM

## 2017-08-01 DIAGNOSIS — B182 Chronic viral hepatitis C: Secondary | ICD-10-CM

## 2017-08-01 DIAGNOSIS — I517 Cardiomegaly: Secondary | ICD-10-CM | POA: Insufficient documentation

## 2017-08-01 DIAGNOSIS — I110 Hypertensive heart disease with heart failure: Secondary | ICD-10-CM

## 2017-08-01 DIAGNOSIS — R0602 Shortness of breath: Secondary | ICD-10-CM | POA: Insufficient documentation

## 2017-08-01 DIAGNOSIS — I4581 Long QT syndrome: Secondary | ICD-10-CM | POA: Insufficient documentation

## 2017-08-01 DIAGNOSIS — I10 Essential (primary) hypertension: Secondary | ICD-10-CM

## 2017-08-01 DIAGNOSIS — F17200 Nicotine dependence, unspecified, uncomplicated: Secondary | ICD-10-CM

## 2017-08-01 DIAGNOSIS — I5022 Chronic systolic (congestive) heart failure: Secondary | ICD-10-CM

## 2017-08-01 DIAGNOSIS — F1290 Cannabis use, unspecified, uncomplicated: Secondary | ICD-10-CM

## 2017-08-01 DIAGNOSIS — F17210 Nicotine dependence, cigarettes, uncomplicated: Secondary | ICD-10-CM

## 2017-08-01 DIAGNOSIS — Z789 Other specified health status: Secondary | ICD-10-CM | POA: Insufficient documentation

## 2017-08-01 DIAGNOSIS — I5020 Unspecified systolic (congestive) heart failure: Secondary | ICD-10-CM | POA: Insufficient documentation

## 2017-08-01 DIAGNOSIS — B1920 Unspecified viral hepatitis C without hepatic coma: Secondary | ICD-10-CM

## 2017-08-01 DIAGNOSIS — Z7289 Other problems related to lifestyle: Secondary | ICD-10-CM

## 2017-08-01 DIAGNOSIS — F1099 Alcohol use, unspecified with unspecified alcohol-induced disorder: Secondary | ICD-10-CM

## 2017-08-01 DIAGNOSIS — F1090 Alcohol use, unspecified, uncomplicated: Secondary | ICD-10-CM | POA: Insufficient documentation

## 2017-08-01 DIAGNOSIS — R0601 Orthopnea: Secondary | ICD-10-CM

## 2017-08-01 DIAGNOSIS — Z598 Other problems related to housing and economic circumstances: Secondary | ICD-10-CM

## 2017-08-01 DIAGNOSIS — R9431 Abnormal electrocardiogram [ECG] [EKG]: Secondary | ICD-10-CM | POA: Insufficient documentation

## 2017-08-01 MED ORDER — CARVEDILOL 12.5 MG PO TABS: 12.5000 mg | ORAL_TABLET | Freq: Two times a day (BID) | ORAL | 0 refills | Status: DC

## 2017-08-01 MED ORDER — SPIRONOLACTONE 25 MG PO TABS: 25.0000 mg | ORAL_TABLET | Freq: Every day | ORAL | 0 refills | Status: DC

## 2017-08-01 MED FILL — CARVEDILOL 12.5 MG TABLET: 12.5 | 30 days supply | Qty: 60 | Fill #0

## 2017-08-01 MED FILL — SPIRONOLACTONE 25 MG TABLET: 25 | 30 days supply | Qty: 30 | Fill #0

## 2017-08-01 NOTE — Assessment & Plan Note (Addendum)
Assessment TTE 07/2017 showed LVEF 35-40% and grade 1 DD, which is a slight improvement in EF from her previous echo. She endorses intermittent DOE. Denies change in orthopnea and denies PND. No signs of volume overload on exam. Not currently on diuretic. Reports compliance with her medications. Previously seen by Dr. Doylene Canard in Cardiology. HR 88, has room to increase beta blocker.  EKG on discharge showed lateral T wave inversions. EKG was repeated today, which showed improvement in lateral T wave inversions, possibly related to demand in the setting of hypertension/cardiomyopathy. Patient denies any complaints of chest pain today.  Plan - Counseled on alcohol and smoking cessation - Continue lisinopril 40mg  daily - Increase carvedilol to 12.5mg  BID - Start spironolactone 25mg  daily - Can consider BiDil in the future / referral to Cardiology once [facility] obtained

## 2017-08-01 NOTE — Assessment & Plan Note (Addendum)
Assessment Patient recently admitted 4/1-4/2 to IMTS service for hypertensive emergency and pulmonary edema in the setting of not taking previous blood pressure medications due to financial constraints. She reports compliance with her discharge anti-hypertensives of carvedilol 3.125mg  BID and lisinopril 40mg  daily. She denies headache, blurry vision, or chest pain today. BP remains elevated at 182/128, repeat 158/110. HR 88. Will start additional anti-hypertensives that have benefit in HFrEF as well.  Plan - Continue lisinopril 40mg  daily - Increase to carvedilol 12.5mg  BID - Start spironolactone 25mg  daily - Check BMP today --- Will call patient to advise regarding spironolactone pending K results  ADDENDUM: BMP wnl. K 4.6 - called patient to inform her of results. Asked her to RTC in 2 weeks for labs only visit to re-check BMP

## 2017-08-01 NOTE — Progress Notes (Signed)
   CC: management of HTN  HPI:  [name] is a 42 y.o. female with PMH of HTN, HFrEF (35-40%, grade 1 DD), alcohol use disorder, and untreated HCV who presents for a hospital follow up visit and to establish care.  She was recently admitted 4/1-4/2 for hypertensive emergency and pulmonary edema in the setting of not taking previous blood pressure medications due to financial constraints. She was discharged on carvedilol 3.125mg  BID and lisinopril 40mg  daily. No diuretic given euvolemic state.  Initial EKG and serial troponins were negative for ACS, however she was noted to have new T wave inversions in lateral leads on repeat EKG prior to discharge. On phone call follow up, she was not complaining of chest pain.  Since discharge, she reports feeling tired, but otherwise she is feeling better. She denies HA, blurry vision, CP, SOB at rest, LE edema, or dysuria. She endorses mild DOE, has chronic 1 pillow orthopnea, and did have intermittent PND prior to her admission but has not had PND since discharge. She reports compliance with both the lisinopril and the carvedilol.  She has cut back on her tobacco and alcohol use - she has smoked 3 cigarettes since discharge and has had 2 wine coolers per day. She endorses marijuana use 2 times monthly. Last used yesterday. Denies other illicit drug use. Lives at home with fiance.  FHx: mother states she has significant heart attack on mom and dad side, but not sure of the age or specifically what happened  Past Medical History:  Diagnosis Date  . Hepatitis C   . Hypertension    Review of Systems:   GEN: Negative for fevers NEURO: Negative for headaches or blurry vision CV: Negative for chest pain.  PULM: Negative for SOB at rest or PND. Positive for intermittent DOE, 1 pillow chronic orthopnea. GU: Negative for dysuria EXT: Negative for LE edema  Physical Exam:  Vitals:   08/01/17 1016 08/01/17 1040  BP: (!) 182/128 (!) 158/110    Pulse: 88   Temp: 98.4 F (36.9 C)   TempSrc: Oral   SpO2: 100%   Weight: 159 lb 9.6 oz (72.4 kg)   Height: 5\' 2"  (1.575 m)    GEN: Sitting in chair in NAD EYES: PERRL. No scleral icterus CV: NR & RR, no m/r/g PULM: CTAB, no wheezes or rales EXT: No LE edema  Assessment & Plan:   Financial constraints Patient does not have insurance. Discussed working on getting the [employer] in order to continue further work-up and treatment. Patient provided with the documents needed for the [facility] and encouraged to drop them off at our clinic when prepared. Also advised patient to call our clinic on Monday to ask to schedule an appointment with Bonna Gains (financial counselor).  See Encounters Tab for problem based charting.  Patient discussed with Dr. Angelia Mould

## 2017-08-01 NOTE — Assessment & Plan Note (Signed)
Assessment Patient reports a significant history of alcohol use prior to her recent admission. Since discharge, she has had 2 alcoholic beverages a day. Counseled patient on importance of stopping alcohol use.  Plan - Counseled on alcohol cessation

## 2017-08-01 NOTE — Assessment & Plan Note (Signed)
Assessment Previously smoked 1/2ppd, since discharge she reports having only smoked 3 cigarettes total. Congratulated patient on the decrease and encouraged her to continue working on smoking cessation.  Plan - Counseled on smoking cessation

## 2017-08-01 NOTE — Patient Instructions (Addendum)
FOLLOW-UP INSTRUCTIONS When: 1 month For: BP management and HF management What to bring: medications   Ms. [name],  It was a pleasure to meet you today.  We are checking some labwork today. I will let you know the results when I have them back.  For your blood pressure, please INCREASE your carvedilol (coreg) to 12.5mg  twice a day. Please START spironolactone 25mg  once a day. You can pick this medicine up from the [facility] for $4. Please continue your lisinopril 40mg  once a day. I will call you about the spironolactone if you need to stop it based on your labwork.  Please continue to try to quit smoking and drinking. Quitting smoking and drinking will be helpful for your overall health - including your heart, lungs, and liver.  It is very important that we start working on getting you the orange card. There is information here about what you need to bring. We will have you talk to our financial counselor as well. Once we get you the orange card, we can order other tests to help treat your medical problems. The quicker you can bring these things back, the faster we can get the orange card approved, so when you have these documents ready, please bring them by our clinic.  Please return to our clinic in 1 month.

## 2017-08-01 NOTE — Assessment & Plan Note (Signed)
Assessment Untreated. HCV viral load ~4 million. Will need labwork and treatment for HCV. She does not currently have insurance, so once she gets the orange card, she is potentially a candidate for PCP-directed HCV treatment.  Plan - In the process of obtaining Orange Card - Once obtained, can order labwork for HCV and start treatment

## 2017-08-02 LAB — BMP8+ANION GAP
Anion Gap: 13 mmol/L (ref 10.0–18.0)
BUN/Creatinine Ratio: 15 (ref 9–23)
BUN: 10 mg/dL (ref 6–24)
CALCIUM: 9 mg/dL (ref 8.7–10.2)
CHLORIDE: 100 mmol/L (ref 96–106)
CO2: 23 mmol/L (ref 20–29)
CREATININE: 0.66 mg/dL (ref 0.57–1.00)
GFR calc non Af Amer: 109 mL/min/{1.73_m2} (ref >59)
GFR, EST AFRICAN AMERICAN: 126 mL/min/{1.73_m2} (ref >59)
GLUCOSE: 91 mg/dL (ref 65–99)
Potassium: 4.6 mmol/L (ref 3.5–5.2)
Sodium: 136 mmol/L (ref 134–144)

## 2017-08-02 NOTE — Addendum Note (Signed)
Addended by: Oretha Caprice on: 08/02/2017 08:15 PM   Modules accepted: Orders

## 2017-08-04 NOTE — Progress Notes (Signed)
Internal Medicine Clinic Attending  Case discussed with Dr. Ronalee Red at the time of the visit.  We reviewed the resident's history and exam and pertinent patient test results.  I agree with the assessment, diagnosis, and plan of care documented in the resident's note. Agree with increased coreg, we added spironolactone, given her potassium was at higher end of normal range will have her back for labs in about 1 week.

## 2017-08-12 ENCOUNTER — Other Ambulatory Visit: Payer: Self-pay

## 2017-08-13 ENCOUNTER — Other Ambulatory Visit: Payer: Self-pay

## 2017-08-18 ENCOUNTER — Emergency Department (HOSPITAL_COMMUNITY): Payer: Medicaid Other

## 2017-08-18 ENCOUNTER — Encounter (HOSPITAL_COMMUNITY): Payer: Self-pay | Admitting: Emergency Medicine

## 2017-08-18 ENCOUNTER — Emergency Department (HOSPITAL_COMMUNITY)
Admission: EM | Admit: 2017-08-18 | Discharge: 2017-08-18 | Disposition: A | Discharge status: Home / Self Care | Payer: Medicaid Other | Attending: Emergency Medicine | Admitting: Emergency Medicine

## 2017-08-18 DIAGNOSIS — Z79899 Other long term (current) drug therapy: Secondary | ICD-10-CM | POA: Insufficient documentation

## 2017-08-18 DIAGNOSIS — F17210 Nicotine dependence, cigarettes, uncomplicated: Secondary | ICD-10-CM | POA: Insufficient documentation

## 2017-08-18 DIAGNOSIS — I509 Heart failure, unspecified: Secondary | ICD-10-CM | POA: Insufficient documentation

## 2017-08-18 DIAGNOSIS — I160 Hypertensive urgency: Secondary | ICD-10-CM

## 2017-08-18 DIAGNOSIS — I110 Hypertensive heart disease with heart failure: Secondary | ICD-10-CM | POA: Insufficient documentation

## 2017-08-18 LAB — BASIC METABOLIC PANEL
ANION GAP: 7 (ref 5–15)
BUN: 7 mg/dL (ref 6–20)
CALCIUM: 8.6 mg/dL — AB (ref 8.9–10.3)
CO2: 23 mmol/L (ref 22–32)
CREATININE: 0.6 mg/dL (ref 0.44–1.00)
Chloride: 104 mmol/L (ref 101–111)
GFR calc non Af Amer: >60 mL/min (ref >60)
Glucose, Bld: 106 mg/dL — ABNORMAL HIGH (ref 65–99)
Potassium: 4 mmol/L (ref 3.5–5.1)
SODIUM: 134 mmol/L — AB (ref 135–145)

## 2017-08-18 LAB — I-STAT TROPONIN, ED: TROPONIN I, POC: 0.01 ng/mL (ref 0.00–0.08)

## 2017-08-18 LAB — I-STAT BETA HCG BLOOD, ED (MC, WL, AP ONLY)

## 2017-08-18 LAB — CBC
HCT: 38.7 % (ref 36.0–46.0)
HEMOGLOBIN: 13.3 g/dL (ref 12.0–15.0)
MCH: 29.6 pg (ref 26.0–34.0)
MCHC: 34.4 g/dL (ref 30.0–36.0)
MCV: 86 fL (ref 78.0–100.0)
PLATELETS: 226 10*3/uL (ref 150–400)
RBC: 4.5 MIL/uL (ref 3.87–5.11)
RDW: 13 % (ref 11.5–15.5)
WBC: 6 10*3/uL (ref 4.0–10.5)

## 2017-08-18 LAB — BRAIN NATRIURETIC PEPTIDE: B Natriuretic Peptide: 137.4 pg/mL — ABNORMAL HIGH (ref 0.0–100.0)

## 2017-08-18 MED ORDER — HYDRALAZINE HCL 25 MG PO TABS
25.0000 mg | ORAL_TABLET | Freq: Once | ORAL | Status: AC
  Administered 2017-08-18: 25 mg via ORAL
  Filled 2017-08-18: qty 1

## 2017-08-18 MED ORDER — LISINOPRIL 20 MG PO TABS
40.0000 mg | ORAL_TABLET | Freq: Every day | ORAL | Status: DC
  Administered 2017-08-18: 40 mg via ORAL
  Filled 2017-08-18: qty 2

## 2017-08-18 MED ORDER — CARVEDILOL 12.5 MG PO TABS
12.5000 mg | ORAL_TABLET | Freq: Two times a day (BID) | ORAL | Status: DC
  Administered 2017-08-18 (×2): 12.5 mg via ORAL
  Filled 2017-08-18 (×2): qty 1

## 2017-08-18 MED ORDER — SPIRONOLACTONE 25 MG PO TABS
25.0000 mg | ORAL_TABLET | Freq: Every day | ORAL | Status: DC
  Filled 2017-08-18: qty 1

## 2017-08-18 NOTE — Discharge Instructions (Signed)
All the results in the ER are normal, labs and imaging. We are not sure what is causing your symptoms. The workup in the ER is not complete, and is limited to screening for life threatening and emergent conditions only, so please see a primary care doctor for further evaluation.  Please return to the ER if your symptoms worsen; you have increased pain, fevers, chills, inability to keep any medications down, confusion.

## 2017-08-18 NOTE — ED Provider Notes (Signed)
[facility] EMERGENCY DEPARTMENT Provider Note   CSN: [id number] Arrival date & time: 08/18/17  0301     History   Chief Complaint Chief Complaint  Patient presents with  . Shortness of Breath    HPI [name] is a 42 y.o. female.  HPI 42 year old female with history of systolic and diastolic heart failure, hypertension comes in with chief complaint of shortness of breath and chest discomfort. Patient states that her shortness of breath started yesterday.  Her shortness of breath is present even at rest.  Tonight, she woke up in the middle night with chest tightness and difficulty breathing therefore she decided to come to the ER.  Patient's chest tightness is midsternal, nonradiating.  Patient's blood pressure is noted to be at 218/147.  Patient states that she has been taking her medications as prescribed.  Patient does not use any drugs.  Pt has no hx of PE, DVT and denies any exogenous hormone (testosterone / estrogen) use, long distance travels or surgery in the past 6 weeks, active cancer, recent immobilization.   Past Medical History:  Diagnosis Date  . Hepatitis C   . Hypertension     Patient Active Problem List   Diagnosis Date Noted  . Alcohol use 08/01/2017  . Heart failure with reduced ejection fraction ([place]) 08/01/2017  . Hepatitis C 11/02/2007  . Tobacco use disorder 06/18/2007  . Essential hypertension 04/27/2007    Past Surgical History:  Procedure Laterality Date  . LEFT AND RIGHT HEART CATHETERIZATION WITH CORONARY ANGIOGRAM N/A 06/23/2013   Procedure: LEFT AND RIGHT HEART CATHETERIZATION WITH CORONARY ANGIOGRAM;  Surgeon: Birdie Riddle, MD;  Location: [facility];  Service: Cardiovascular;  Laterality: N/A;  . LIVER BIOPSY    . LIVER BIOPSY       OB History   None      Home Medications    Prior to Admission medications   Medication Sig Start Date End Date Taking? Authorizing Provider  carvedilol (COREG) 12.5 MG  tablet Take 1 tablet (12.5 mg total) by mouth 2 (two) times daily with a meal. IM PROGRAM 08/01/17  Yes Colbert Ewing, MD  lisinopril (PRINIVIL,ZESTRIL) 40 MG tablet Take 1 tablet (40 mg total) by mouth daily. 07/30/17  Yes Tawny Asal, MD  spironolactone (ALDACTONE) 25 MG tablet Take 1 tablet (25 mg total) by mouth daily. IM PROGRAM 08/01/17 10/30/17 Yes Colbert Ewing, MD    Family History Family History  Problem Relation Age of Onset  . Hypertension Mother   . Hyperlipidemia Mother   . Diabetes Maternal Aunt     Social History Social History   Tobacco Use  . Smoking status: Current Every Day Smoker    Packs/day: 0.25    Types: Cigarettes  . Smokeless tobacco: Never Used  Substance Use Topics  . Alcohol use: Yes    Alcohol/week: 1.2 oz    Types: 2 Standard drinks or equivalent per week  . Drug use: Yes    Frequency: 0.5 times per week    Types: Marijuana     Allergies   Patient has no known allergies.   Review of Systems Review of Systems  Constitutional: Positive for activity change.  Respiratory: Positive for chest tightness and shortness of breath. Negative for cough.   Cardiovascular: Positive for chest pain.  Gastrointestinal: Negative for nausea and vomiting.  All other systems reviewed and are negative.    Physical Exam Updated Vital Signs BP (!) 150/100   Pulse 60  Temp 98.9 F (37.2 C) (Oral)   Resp 14   Ht 5\' 2"  (1.575 m)   Wt 72.1 kg (159 lb)   LMP 08/13/2017   SpO2 96%   BMI 29.08 kg/m   Physical Exam  Constitutional: She is oriented to person, place, and time. She appears well-developed.  HENT:  Head: Normocephalic and atraumatic.  Eyes: EOM are normal.  Neck: Normal range of motion. Neck supple.  Cardiovascular: Normal rate.  Pulmonary/Chest: Effort normal. No accessory muscle usage. No respiratory distress. She has no decreased breath sounds. She has no wheezes. She has no rhonchi. She has no rales.  Abdominal: Bowel sounds are normal.   Musculoskeletal:       Right lower leg: She exhibits no tenderness and no edema.       Left lower leg: She exhibits no tenderness and no edema.  Neurological: She is alert and oriented to person, place, and time.  Skin: Skin is warm and dry.  Nursing note and vitals reviewed.    ED Treatments / Results  Labs (all labs ordered are listed, but only abnormal results are displayed) Labs Reviewed  BASIC METABOLIC PANEL - Abnormal; Notable for the following components:      Result Value   Sodium 134 (*)    Glucose, Bld 106 (*)    Calcium 8.6 (*)    All other components within normal limits  CBC  BRAIN NATRIURETIC PEPTIDE  I-STAT TROPONIN, ED  I-STAT BETA HCG BLOOD, ED ([facility], [facility], [facility] ONLY)    EKG EKG Interpretation  Date/Time:  Monday August 18 2017 03:11:14 EDT Ventricular Rate:  79 PR Interval:  162 QRS Duration: 86 QT Interval:  436 QTC Calculation: 499 R Axis:   -6 Text Interpretation:  Normal sinus rhythm Possible Left atrial enlargement Anterior infarct , age undetermined Abnormal ECG No significant change since last tracing Confirmed by Pryor Curia [phone]) on 08/18/2017 3:18:49 AM Also confirmed by Ward, Cyril Mourning [phone]), editor Philomena Doheny [phone])  on 08/18/2017 7:03:39 AM   Radiology Dg Chest 2 View  Result Date: 08/18/2017 CLINICAL DATA:  Acute onset of shortness of breath and generalized chest tightness. EXAM: CHEST - 2 VIEW COMPARISON:  Chest radiograph performed 07/28/2017 FINDINGS: The lungs are well-aerated and clear. There is no evidence of focal opacification, pleural effusion or pneumothorax. The heart is mildly enlarged. No acute osseous abnormalities are seen. IMPRESSION: Mild cardiomegaly.  Lungs remain grossly clear. Electronically Signed   By: Garald Balding M.D.   On: 08/18/2017 03:47    Procedures Procedures (including critical care time)  Medications Ordered in ED Medications  carvedilol (COREG) tablet 12.5 mg (12.5 mg Oral Given 08/18/17 0531)    lisinopril (PRINIVIL,ZESTRIL) tablet 40 mg (40 mg Oral Given 08/18/17 0530)  spironolactone (ALDACTONE) tablet 25 mg (has no administration in time range)  hydrALAZINE (APRESOLINE) tablet 25 mg (25 mg Oral Given 08/18/17 0530)     Initial Impression / Assessment and Plan / ED Course  I have reviewed the triage vital signs and the nursing notes.  Pertinent labs & imaging results that were available during my care of the patient were reviewed by me and considered in my medical decision making (see chart for details).  Clinical Course as of Aug 19 734  Mon Aug 18, 2017  7341 Patient reassessed.  Her blood pressure now is 150/100.  She denies any ongoing chest discomfort.  Chest x-ray is normal, and the lung exam has remained normal as well.  Patient is comfortable  going home and I am comfortable with the plan. Advised pt to measure BP properly and see pcp soon. Strict ER return precautions have been discussed, and patient is agreeing with the plan and is comfortable with the workup done and the recommendations from the ER.   BP(!): 150/100 [AN]    Clinical Course User Index [AN] Varney Biles, MD   42 year old female with history of nonischemic cardiomyopathy, poorly controlled blood pressure comes in with chief complaint of shortness of breath and chest tightness.  Patient symptoms started while she was asleep.  Patient's blood pressure is noted to be elevated.  She had an admission recently for hypertensive emergency and CHF.  Lung exam is clear.  Patient does not have any underlying lung disease or history/risk factors for PE.  Differential diagnosis includes hypertensive emergency, CHF, ACS, PE. Chest x-ray does not show significant pulmonary congestion. Labs have been ordered including BNP to see if there is diastolic dysfunction contributing to the dyspnea.    Final Clinical Impressions(s) / ED Diagnoses   Final diagnoses:  Hypertensive urgency    ED Discharge Orders     None       Varney Biles, MD 08/18/17 [id number]

## 2017-08-18 NOTE — ED Triage Notes (Signed)
Pt presents with shob onset yesterday, pt states she woke up with chest tightness and worsening shob, short sentences in triage.  Pt recently admitted for same. Pt reports she is compliant with all meds

## 2017-08-18 NOTE — ED Notes (Signed)
ED Provider at bedside. 

## 2017-08-18 NOTE — ED Notes (Signed)
Patient's oxygen level is 100% while ambulating through Pod D (approximately 50 feet).

## 2017-08-20 ENCOUNTER — Other Ambulatory Visit: Payer: Self-pay

## 2017-08-20 ENCOUNTER — Ambulatory Visit (INDEPENDENT_AMBULATORY_CARE_PROVIDER_SITE_OTHER): Payer: Self-pay | Admitting: Internal Medicine

## 2017-08-20 VITALS — BP 182/120 | HR 71 | Temp 98.3°F | Ht 62.0 in | Wt 155.1 lb

## 2017-08-20 DIAGNOSIS — I10 Essential (primary) hypertension: Secondary | ICD-10-CM

## 2017-08-20 DIAGNOSIS — F1099 Alcohol use, unspecified with unspecified alcohol-induced disorder: Secondary | ICD-10-CM

## 2017-08-20 DIAGNOSIS — I110 Hypertensive heart disease with heart failure: Secondary | ICD-10-CM

## 2017-08-20 DIAGNOSIS — Z79899 Other long term (current) drug therapy: Secondary | ICD-10-CM

## 2017-08-20 DIAGNOSIS — I5020 Unspecified systolic (congestive) heart failure: Secondary | ICD-10-CM

## 2017-08-20 DIAGNOSIS — B1920 Unspecified viral hepatitis C without hepatic coma: Secondary | ICD-10-CM

## 2017-08-20 MED ORDER — HYDROCHLOROTHIAZIDE 25 MG PO TABS: 25.0000 mg | ORAL_TABLET | Freq: Every day | ORAL | 0 refills | Status: DC

## 2017-08-20 MED ORDER — LISINOPRIL 40 MG PO TABS: 40.0000 mg | ORAL_TABLET | Freq: Every day | ORAL | 0 refills | Status: DC

## 2017-08-20 MED ORDER — AMLODIPINE BESYLATE 5 MG PO TABS: 5.0000 mg | ORAL_TABLET | Freq: Every day | ORAL | 0 refills | Status: DC

## 2017-08-20 MED FILL — AMLODIPINE BESYLATE 5 MG TA: 5 | 30 days supply | Qty: 30 | Fill #0

## 2017-08-20 MED FILL — LISINOPRIL 40 MG TABLET: 40 | 30 days supply | Qty: 30 | Fill #0

## 2017-08-20 MED FILL — HYDROCHLOROTHIAZIDE 25 MG T: 25 | 30 days supply | Qty: 30 | Fill #0

## 2017-08-20 NOTE — Patient Instructions (Addendum)
FOLLOW-UP INSTRUCTIONS When: 2 weeks For: BP management What to bring: medications   Ms. [name],  It was good to see you again.  Your blood pressure is still high. We are going to make some changes today. - Please continue lisinopril, carvedilol, and spironolactone. - Please start amlodipine 5mg  daily - Please start HCTZ 25mg  daily You can pick up each of these new medicines for $4 at the [facility].  Please follow up with [place] in about 2 weeks to see how your blood pressure is doing.  Please also make sure to see Bonna Gains next week about getting the [facility] - it is important that we get this done so that we can start ordering tests/imaging studies that are needed.

## 2017-08-20 NOTE — Progress Notes (Signed)
   CC: management of BP  HPI:  [name] is a 42 y.o. female with PMH of HTN, HFrEF (35-40%, grade 1 DD), alcohol use disorder, and untreated HCV who presents for management of BP.  BP: She was seen in clinic on 4/5 with BP 158/110. She was started on spironolactone 25mg  daily. Lisinopril 40mg  daily was continued and carvedilol was increased to 12.5mg  BID. She reports compliance with her medications. She denies HA, chest pain, shortness of breath, or lightheadedness. BP today is 216/108, repeat 182/120. HR 61.  She went to the ER on 4/22 for shortness of breath that occurred when lying on her side in bed, associated with chest tightness. She was treated for elevated BP. Labwork, including troponin, BNP, and CXR was unremarkable. She denies current symptoms.   She does state that she snores at night, particularly when she lies on her back. She states that she did used to experience PND, but not recently. She denies morning HA, but does endorse daytime somnolence. She states that a while ago, she had an appointment for a sleep study, however she never got this done.  Please see the assessment and plan below for the status of the patient's chronic medical problems.  Past Medical History:  Diagnosis Date  . Hepatitis C   . Hypertension    Review of Systems:   GEN: Negative for fevers NEURO: Negative for headaches CV: Negative for CP PULM: Negative for SOB MSK: Negative for LE edema  Physical Exam:  Vitals:   08/20/17 1505 08/20/17 1530  BP: (!) 216/108 (!) 182/120  Pulse: 61 71  Temp: 98.3 F (36.8 C)   TempSrc: Oral   SpO2: 100%   Weight: 155 lb 1.6 oz (70.4 kg)   Height: 5\' 2"  (1.575 m)    GEN: Sitting comfortably in chair in NAD CV: NR & RR, no m/r/g PULM: CTAB, no wheezes or rales ABD: Soft, NT, ND, +BS, No abdominal bruits EXT: No LE edema  Assessment & Plan:   See Encounters Tab for problem based charting.  Patient discussed with Dr. Beryle Beams

## 2017-08-20 NOTE — Assessment & Plan Note (Signed)
Assessment BP remains significantly elevated to 216/108, repeat 182/120 despite compliance with lisinopril 40mg  daily, spironolactone 25mg  daily, and carvedilol 12.5mg  BID. With HR 61, we are likely maxed out on carvedilol. Will start additional anti-hypertensives.   With persistently elevated BP, could consider work-up for secondary causes of HTN. She does have symptoms suggestive of sleep apnea and may benefit from sleep study. Less likely thyroid disease, however could consider this with lab draw at next visit. She did have normal TSH in 2015. No abdominal bruits on exam and sodium 134, potassium 4.0, less likely hyperaldosteronism. Will hold off on further referrals for now until patient is able to obtain [employer] - Continue lisinopril 40mg  daily, spironolactone 25mg  daily, and carvedilol 12.5mg  BID - Start amlodipine 5mg  daily (sent to [facility] for $4)  - Start HCTZ 25mg  daily (sent to [facility] for $4)  - RTC in 2 weeks for BP check --- BMP and ?TSH at next visit - Would benefit from Referral to Sleep Medicine when [facility] obtained

## 2017-08-21 NOTE — Progress Notes (Signed)
Medicine attending: Medical history, presenting problems, physical findings, and medications, reviewed with resident physician Dr Jennifer Huang on the day of the patient visit and I concur with her evaluation and management plan. 

## 2017-08-28 ENCOUNTER — Ambulatory Visit: Payer: Self-pay

## 2017-08-28 ENCOUNTER — Ambulatory Visit (INDEPENDENT_AMBULATORY_CARE_PROVIDER_SITE_OTHER): Payer: Self-pay | Admitting: Internal Medicine

## 2017-08-28 VITALS — BP 141/82 | HR 69 | Temp 98.2°F | Wt 155.8 lb

## 2017-08-28 DIAGNOSIS — Z79899 Other long term (current) drug therapy: Secondary | ICD-10-CM

## 2017-08-28 DIAGNOSIS — I10 Essential (primary) hypertension: Secondary | ICD-10-CM

## 2017-08-28 MED ORDER — SPIRONOLACTONE 25 MG PO TABS: 25.0000 mg | ORAL_TABLET | Freq: Every day | ORAL | 0 refills | Status: DC

## 2017-08-28 MED FILL — SPIRONOLACTONE 25 MG TABLET: 25 | 30 days supply | Qty: 30 | Fill #0

## 2017-08-28 NOTE — Progress Notes (Signed)
   CC: Blood pressure evaluation   HPI:[name] is a 42 y.o. female who presents today for evaluation of blood pressure.    Essential hypertension: She was most recently seen on 08/20/2017 and at that time started on amlodipine 5 mg daily, HCTZ 25 mg daily and was told to return in 2 weeks for BMP and BP check.  Patient blood pressures currently improved 141/82.  She denies nausea, vomiting, diarrhea, constipation, chest pain,, fatigue, visual changes, or other concerning symptoms. Upon establishing with PCP she will need evaluation as this is highly unlikely to be primary hypertension given the persistence to treatment.  Hyperaldosteronism seems unlikely given the relatively normal sodium potassium.  Concern for fibromuscular dysplasia leading to renal artery stenosis as a possibility as well.  Once patient is able to obtain the [facility] cared which she is in process for today, I suggest we evaluate her more extensively secondary cause her severe difficult to control hypertension as she prefers to defer extensive workup until insurance is available.   Plan:  BMP tdoay-creatinine 0.80, BUN 15, sodium 141, potassium 4.1 otherwise unremarkable. Recommend continuing her current regimen with lisinopril 40 mg daily, prolactin 40 mg daily, carvedilol 2.5 mg twice daily, amlodipine 5 mg daily, HCTZ 25 mg daily. Return in 4 weeks for BMP repeat BP check.  Past Medical History:  Diagnosis Date  . Hepatitis C   . Hypertension    Review of Systems: ROS negative except as per HPI  Physical Exam:  Vitals:   08/28/17 1325  BP: (!) 141/82  Pulse: 69  Temp: 98.2 F (36.8 C)  TempSrc: Oral  SpO2: 100%  Weight: 155 lb 12.8 oz (70.7 kg)   General: Alert, oriented, no acute distress, conversant, afebrile, nondiaphoretic, appears comfortable Cardiac: RRR no rubs or gallops auscultated Pulmonary: Lungs clear to auscultation bilaterally with no wheezing rhonchi or rales GI: Soft, nontender,  nondistended with normal bowel sounds Muscle skeletal: Bilateral lower extremities are nonedematous, nontender to palpation  Assessment & Plan:   See Encounters Tab for problem based charting.  Patient discussed with Dr. Rebeca Alert

## 2017-08-28 NOTE — Patient Instructions (Signed)
FOLLOW-UP INSTRUCTIONS When: between the 29th and 31st of this month For: BP check and BMP lab repeat What to bring: ALL OF YOUR MEDICATIONS PLEASE  We will continue your current treatment at this time.  Please continue to follow with [place] and continue to take your medications daily.  If you have any questions please feel free to call us at any time.  Thank you for your visit to the [facility] today.

## 2017-08-29 LAB — BMP8+ANION GAP
Anion Gap: 15 mmol/L (ref 10.0–18.0)
BUN/Creatinine Ratio: 19 (ref 9–23)
BUN: 15 mg/dL (ref 6–24)
CO2: 24 mmol/L (ref 20–29)
Calcium: 8.8 mg/dL (ref 8.7–10.2)
Chloride: 102 mmol/L (ref 96–106)
Creatinine, Ser: 0.8 mg/dL (ref 0.57–1.00)
GFR calc Af Amer: 105 mL/min/{1.73_m2} (ref >59)
GFR calc non Af Amer: 91 mL/min/{1.73_m2} (ref >59)
Glucose: 63 mg/dL — ABNORMAL LOW (ref 65–99)
Potassium: 4.1 mmol/L (ref 3.5–5.2)
Sodium: 141 mmol/L (ref 134–144)

## 2017-08-29 NOTE — Assessment & Plan Note (Signed)
  Essential hypertension: She was most recently seen on 08/20/2017 and at that time started on amlodipine 5 mg daily, HCTZ 25 mg daily and was told to return in 2 weeks for BMP and BP check.  Patient blood pressures currently improved 141/82.  She denies nausea, vomiting, diarrhea, constipation, chest pain,, fatigue, visual changes, or other concerning symptoms. Upon establishing with PCP she will need evaluation as this is highly unlikely to be primary hypertension given the persistence to treatment.  Hyperaldosteronism seems unlikely given the relatively normal sodium potassium.  Concern for fibromuscular dysplasia leading to renal artery stenosis as a possibility as well.  Once patient is able to obtain the [facility] cared which she is in process for today, I suggest we evaluate her more extensively secondary cause her severe difficult to control hypertension as she prefers to defer extensive workup until insurance is available.   Plan:  BMP tdoay-creatinine 0.80, BUN 15, sodium 141, potassium 4.1 otherwise unremarkable. Recommend continuing her current regimen with lisinopril 40 mg daily, prolactin 40 mg daily, carvedilol 2.5 mg twice daily, amlodipine 5 mg daily, HCTZ 25 mg daily. Return in 4 weeks for BMP repeat BP check.

## 2017-09-01 MED FILL — CARVEDILOL 12.5 MG TABLET: 12.5 | 30 days supply | Qty: 60 | Fill #1

## 2017-09-01 NOTE — Progress Notes (Signed)
Internal Medicine Clinic Attending  Case discussed with Dr. Harbrecht  at the time of the visit.  We reviewed the resident's history and exam and pertinent patient test results.  I agree with the assessment, diagnosis, and plan of care documented in the resident's note.  Alexander N Raines, MD   

## 2017-09-17 MED FILL — HYDROCHLOROTHIAZIDE 25 MG T: 25 | 30 days supply | Qty: 30 | Fill #1

## 2017-09-17 MED FILL — AMLODIPINE BESYLATE 5 MG TA: 5 | 30 days supply | Qty: 30 | Fill #1

## 2017-09-25 ENCOUNTER — Ambulatory Visit: Payer: Self-pay

## 2017-09-25 MED FILL — LISINOPRIL 40 MG TABLET: 40 | 30 days supply | Qty: 30 | Fill #1

## 2017-09-25 MED FILL — SPIRONOLACTONE 25 MG TABLET: 25 | 30 days supply | Qty: 30 | Fill #1

## 2017-09-26 NOTE — Addendum Note (Signed)
Addended by: Orson Gear on: 09/26/2017 11:23 AM   Modules accepted: Orders

## 2017-09-30 ENCOUNTER — Other Ambulatory Visit: Payer: Self-pay

## 2017-09-30 ENCOUNTER — Ambulatory Visit (INDEPENDENT_AMBULATORY_CARE_PROVIDER_SITE_OTHER): Payer: Self-pay | Admitting: Internal Medicine

## 2017-09-30 DIAGNOSIS — Z124 Encounter for screening for malignant neoplasm of cervix: Secondary | ICD-10-CM | POA: Insufficient documentation

## 2017-09-30 DIAGNOSIS — I110 Hypertensive heart disease with heart failure: Secondary | ICD-10-CM

## 2017-09-30 DIAGNOSIS — I5020 Unspecified systolic (congestive) heart failure: Secondary | ICD-10-CM

## 2017-09-30 DIAGNOSIS — I10 Essential (primary) hypertension: Secondary | ICD-10-CM

## 2017-09-30 DIAGNOSIS — Z0000 Encounter for general adult medical examination without abnormal findings: Secondary | ICD-10-CM

## 2017-09-30 DIAGNOSIS — I5022 Chronic systolic (congestive) heart failure: Secondary | ICD-10-CM

## 2017-09-30 DIAGNOSIS — Z79899 Other long term (current) drug therapy: Secondary | ICD-10-CM

## 2017-09-30 MED ORDER — LISINOPRIL 40 MG PO TABS: 40.0000 mg | ORAL_TABLET | Freq: Every day | ORAL | 1 refills | Status: DC

## 2017-09-30 MED ORDER — CARVEDILOL 12.5 MG PO TABS: 12.5000 mg | ORAL_TABLET | Freq: Two times a day (BID) | ORAL | 3 refills | Status: DC

## 2017-09-30 MED ORDER — AMLODIPINE BESYLATE 5 MG PO TABS: 5.0000 mg | ORAL_TABLET | Freq: Every day | ORAL | 1 refills | Status: DC

## 2017-09-30 MED ORDER — SPIRONOLACTONE 25 MG PO TABS: 25.0000 mg | ORAL_TABLET | Freq: Every day | ORAL | 1 refills | Status: DC

## 2017-09-30 MED ORDER — HYDROCHLOROTHIAZIDE 25 MG PO TABS
25.0000 mg | ORAL_TABLET | Freq: Every day | ORAL | 1 refills | Status: DC
Start: 2017-09-30 — End: 2018-05-20

## 2017-09-30 MED FILL — CARVEDILOL 12.5 MG TABLET: 12.5 | 30 days supply | Qty: 60 | Fill #0

## 2017-09-30 NOTE — Assessment & Plan Note (Signed)
Patient has not had pap smear since 2012. Denies history of hysterectomy. Will need pap at next visit.

## 2017-09-30 NOTE — Patient Instructions (Addendum)
[name],   It was a pleasure meeting you today.   Continue all your current medications. I have made no changes.  Continue to work on getting health insurance so you can be seen by a Film/video editor.

## 2017-09-30 NOTE — Assessment & Plan Note (Addendum)
Patient denies SOB, CP, LE edema, DOE, orthopnea, or PND. Appears euvolemic on exam today. Doing well on current medication regimen. No issues with affordability. Carvedilol makes her drowsy, but this is tolerable side effect for now.   Plan: -continue Carvedilol 12.5 mg BID -continue Lisinopril 40 mg daily  -will refer to cardiology once insured

## 2017-09-30 NOTE — Progress Notes (Signed)
   CC: hypertension  HPI:  [name] is a 42 y.o. female with past medical history as documented below presenting for follow up of hypertension. Please see encounter based charting for a more detailed description of the patient's acute and chronic medical problems.   Past Medical History:  Diagnosis Date  . Hepatitis C   . HFrEF (heart failure with reduced ejection fraction) ([place])   . Hypertension    Review of Systems:   Review of Systems  Constitutional: Negative for chills and fever.  Respiratory: Negative for cough and shortness of breath.   Cardiovascular: Negative for chest pain, orthopnea, leg swelling and PND.  Gastrointestinal: Negative for nausea and vomiting.  Neurological: Negative for dizziness.    Physical Exam:  Vitals:   09/30/17 1103  BP: 138/84  Pulse: (!) 58  Temp: 98.3 F (36.8 C)  TempSrc: Oral  SpO2: 100%  Weight: 153 lb (69.4 kg)  Height: 5\' 2"  (1.575 m)   Physical Exam  Constitutional: She is oriented to person, place, and time. She appears well-developed and well-nourished. No distress.  HENT:  Head: Normocephalic and atraumatic.  Eyes: Conjunctivae are normal. No scleral icterus.  Neck: Normal range of motion. Neck supple. No JVD present.  Cardiovascular: Normal rate, regular rhythm and normal heart sounds.  Pulmonary/Chest: Effort normal and breath sounds normal. She has no wheezes. She has no rales.  Musculoskeletal: Normal range of motion. She exhibits no edema.  Neurological: She is alert and oriented to person, place, and time. No cranial nerve deficit.  Skin: Skin is warm and dry.  Psychiatric: She has a normal mood and affect.    Assessment & Plan:   See Encounters Tab for problem based charting.  Patient discussed with Dr. Rebeca Alert

## 2017-09-30 NOTE — Assessment & Plan Note (Addendum)
BP Readings from Last 3 Encounters:  09/30/17 138/84  08/28/17 (!) 141/82  08/20/17 (!) 182/120  Blood pressure control improving.  Current blood pressure regimen: Spironolactone 25 mg daily, lisinopril 40 mg daily, hydrochlorothiazide 25 mg daily, carvedilol 12.5 mg twice daily, and amlodipine 5 mg daily.  BMP last visit was within normal limits. Patient states the carvedilol makes her sleepy/drowsy, but states it is tolerable and she does not mind continuing the medication.  Repeat BMP not indicated at this time is no changes in medications last visit and previous BMP was without significant abnormality.   Plan: -Continue current treatment regimen -Will need more extensive work up for resistant hypertension once insured - renal u/s, renin/aldo  -Will refer to cardiology once patient has insurance -defer labs today as prior were stable

## 2017-10-01 NOTE — Progress Notes (Signed)
Internal Medicine Clinic Attending  Case discussed with Dr. LaCroce  at the time of the visit.  We reviewed the resident's history and exam and pertinent patient test results.  I agree with the assessment, diagnosis, and plan of care documented in the resident's note.  Alexander N Raines, MD   

## 2017-10-07 ENCOUNTER — Ambulatory Visit: Payer: Self-pay

## 2017-10-07 ENCOUNTER — Encounter: Payer: Self-pay | Admitting: Internal Medicine

## 2017-10-13 MED FILL — AMLODIPINE BESYLATE 5 MG TA: 5 | 30 days supply | Qty: 30 | Fill #2

## 2017-10-20 MED FILL — HYDROCHLOROTHIAZIDE 25 MG T: 25 | 30 days supply | Qty: 30 | Fill #2

## 2017-10-20 MED FILL — LISINOPRIL 40 MG TABLET: 40 | 30 days supply | Qty: 30 | Fill #2

## 2017-10-20 MED FILL — SPIRONOLACTONE 25 MG TABLET: 25 | 30 days supply | Qty: 30 | Fill #2

## 2017-11-03 MED FILL — CARVEDILOL 12.5 MG TABLET: 12.5 | 30 days supply | Qty: 60 | Fill #1

## 2017-11-10 ENCOUNTER — Other Ambulatory Visit: Payer: Self-pay | Admitting: Internal Medicine

## 2017-11-10 MED FILL — SPIRONOLACTONE 25 MG TABLET: 25 | 30 days supply | Qty: 30 | Fill #0

## 2017-11-10 MED FILL — AMLODIPINE BESYLATE 5 MG TA: 5 | 30 days supply | Qty: 30 | Fill #0

## 2017-11-10 MED FILL — LISINOPRIL 40 MG TABLET: 40 | 30 days supply | Qty: 30 | Fill #0

## 2017-11-10 MED FILL — HYDROCHLOROTHIAZIDE 25 MG T: 25 | 30 days supply | Qty: 30 | Fill #0

## 2017-11-10 NOTE — Telephone Encounter (Signed)
Needs a refill on amlodipine 5mg , hydrochlorothiazide 25mg , spironolactone 25mg , lisinopril 40mg  & carvedilol 12.5mg , asked pharmacy no refills- @ [facility], pt contact # [phone]

## 2017-11-10 NOTE — Telephone Encounter (Signed)
Called pharm, pt has refills on all meds, they will prepare for pickup today, pt informed

## 2017-12-01 MED FILL — CARVEDILOL 12.5 MG TABLET: 12.5 | 30 days supply | Qty: 60 | Fill #2

## 2017-12-15 MED FILL — HYDROCHLOROTHIAZIDE 25 MG T: 25 | 30 days supply | Qty: 30 | Fill #1

## 2017-12-15 MED FILL — SPIRONOLACTONE 25 MG TABLET: 25 | 30 days supply | Qty: 30 | Fill #1

## 2017-12-15 MED FILL — LISINOPRIL 40 MG TABLET: 40 | 30 days supply | Qty: 30 | Fill #1

## 2017-12-15 MED FILL — AMLODIPINE BESYLATE 5 MG TA: 5 | 30 days supply | Qty: 30 | Fill #1

## 2017-12-30 MED FILL — CARVEDILOL 12.5 MG TABLET: 12.5 | 30 days supply | Qty: 60 | Fill #3

## 2018-01-12 MED FILL — LISINOPRIL 40 MG TABLET: 40 | 30 days supply | Qty: 30 | Fill #2

## 2018-01-12 MED FILL — AMLODIPINE BESYLATE 5 MG TA: 5 | 30 days supply | Qty: 30 | Fill #2

## 2018-01-12 MED FILL — SPIRONOLACTONE 25 MG TABLET: 25 | 30 days supply | Qty: 30 | Fill #2

## 2018-01-12 MED FILL — HYDROCHLOROTHIAZIDE 25 MG T: 25 | 30 days supply | Qty: 30 | Fill #2

## 2018-02-02 MED FILL — CARVEDILOL 12.5 MG TABLET: 12.5 | 30 days supply | Qty: 60 | Fill #2

## 2018-02-11 ENCOUNTER — Other Ambulatory Visit: Payer: Self-pay | Admitting: Internal Medicine

## 2018-02-11 DIAGNOSIS — I5022 Chronic systolic (congestive) heart failure: Secondary | ICD-10-CM

## 2018-02-11 MED FILL — LISINOPRIL 40 MG TABLET: 40 | 30 days supply | Qty: 30 | Fill #3

## 2018-02-11 MED FILL — HYDROCHLOROTHIAZIDE 25 MG T: 25 | 30 days supply | Qty: 30 | Fill #3

## 2018-02-11 MED FILL — AMLODIPINE BESYLATE 5 MG TA: 5 | 30 days supply | Qty: 30 | Fill #3

## 2018-02-11 MED FILL — SPIRONOLACTONE 25 MG TABLET: 25 | 30 days supply | Qty: 30 | Fill #3

## 2018-03-02 ENCOUNTER — Other Ambulatory Visit: Payer: Self-pay

## 2018-03-02 MED FILL — CARVEDILOL 12.5 MG TABLET: 12.5 | 30 days supply | Qty: 60 | Fill #0

## 2018-03-02 NOTE — Telephone Encounter (Signed)
carvedilol (COREG) 12.5 MG tablet, REFILL REQUEST @  [place], [place] - [address]. [phone] (Phone) [phone] (Fax)

## 2018-03-02 NOTE — Telephone Encounter (Signed)
[facility] outpatient pharmacy called, Pharmacy tech states RX on file with rf's and it is not too early for pt to have refill if she wants.  Pt called and notified that carvedilol was sent to [facility] outpatient pharmacy on 02/11/18, # 180 w/ 3 RF's and to call pharmacy for refill.  Pt verbalized understanding.  [place], RN

## 2018-03-10 MED FILL — LISINOPRIL 40 MG TABLET: 40 | 30 days supply | Qty: 30 | Fill #4

## 2018-03-10 MED FILL — AMLODIPINE BESYLATE 5 MG TA: 5 | 30 days supply | Qty: 30 | Fill #4

## 2018-03-10 MED FILL — SPIRONOLACTONE 25 MG TABLET: 25 | 30 days supply | Qty: 30 | Fill #4

## 2018-03-10 MED FILL — HYDROCHLOROTHIAZIDE 25 MG T: 25 | 30 days supply | Qty: 30 | Fill #4

## 2018-03-17 NOTE — Progress Notes (Deleted)
   CC: ***  HPI:[name] is a 42 y.o. female who presents for evaluation of ***. Please see individual problem based A/P for details.  HTN: *** Plan: ***  HFrEF: *** LVEF of 35-40% with G1DD, moderate aortic valve regurgitation, RV moderately decreased. Plan:  Continue coreg 12.5mg  BID Continue HCTZ 73m BID Continue Lisinopril 40mg  daily Continue spironolactone 25mg  daily Continue amlodipine 5mg  daily    Need for cervical cancer screening:  Need for influenza vaccination:   PHQ-9: Based on the patients    Office Visit from 09/30/2017 in [facility]  PHQ-9 Total Score  1     score we have ***.  Past Medical History:  Diagnosis Date  . Hepatitis C   . HFrEF (heart failure with reduced ejection fraction) ([place])   . Hypertension    Review of Systems:  ***  Physical Exam: There were no vitals filed for this visit. General: *** HEENT: *** Cardio: *** Pulmonary: *** MSK: ***  Assessment & Plan:   See Encounters Tab for problem based charting.  Patient {GC/GE:3044014::"discussed with","seen with"} Dr. {NAMES:[id number]::"Butcher","Granfortuna","E. Hoffman","Klima","Mullen","Narendra","Raines","Vincent"}

## 2018-03-18 ENCOUNTER — Encounter: Payer: Self-pay | Admitting: Internal Medicine

## 2018-04-07 MED FILL — AMLODIPINE BESYLATE 5 MG TA: 5 | 30 days supply | Qty: 30 | Fill #5

## 2018-04-07 MED FILL — SPIRONOLACTONE 25 MG TABLET: 25 | 30 days supply | Qty: 30 | Fill #5

## 2018-04-07 MED FILL — HYDROCHLOROTHIAZIDE 25 MG T: 25 | 30 days supply | Qty: 30 | Fill #5

## 2018-04-07 MED FILL — LISINOPRIL 40 MG TABLET: 40 | 30 days supply | Qty: 30 | Fill #5

## 2018-04-07 MED FILL — CARVEDILOL 12.5 MG TABLET: 12.5 | 30 days supply | Qty: 60 | Fill #1

## 2018-05-13 ENCOUNTER — Encounter: Payer: Self-pay | Admitting: Internal Medicine

## 2018-05-20 ENCOUNTER — Encounter: Payer: Self-pay | Admitting: Internal Medicine

## 2018-05-20 ENCOUNTER — Other Ambulatory Visit: Payer: Self-pay

## 2018-05-20 ENCOUNTER — Ambulatory Visit (INDEPENDENT_AMBULATORY_CARE_PROVIDER_SITE_OTHER): Payer: Self-pay | Admitting: Internal Medicine

## 2018-05-20 VITALS — BP 172/100 | HR 86 | Temp 98.5°F | Ht 61.0 in | Wt 161.3 lb

## 2018-05-20 DIAGNOSIS — I10 Essential (primary) hypertension: Secondary | ICD-10-CM

## 2018-05-20 DIAGNOSIS — Z79899 Other long term (current) drug therapy: Secondary | ICD-10-CM

## 2018-05-20 MED ORDER — LOSARTAN POTASSIUM-HCTZ 50-12.5 MG PO TABS: 1.0000 | ORAL_TABLET | Freq: Every day | ORAL | 11 refills | Status: DC

## 2018-05-20 MED ORDER — AMLODIPINE BESYLATE 10 MG PO TABS: 10.0000 mg | ORAL_TABLET | Freq: Every day | ORAL | 11 refills | Status: DC

## 2018-05-20 MED FILL — AMLODIPINE BESYLATE 10 MG T: 10 | 30 days supply | Qty: 30 | Fill #0

## 2018-05-20 NOTE — Assessment & Plan Note (Signed)
  HTN: The patient presents today for evaluation of her high blood pressure. She has not been able to return for several months and has run out of most of her medications but has taken her lisinopril and spironolactone the last several days. She denied headaches, nausea, vomiting, chest pain or discomfort, palpitations, or abdominal pain.  She has needed a more extensive evaluation for secondary causes of HTN for some time but due to a lack of insurance she has been unwilling to undergo the cost associated with this. However, given that she has been off of most of her meds today we will obtain an aldo/renin in 5 days.  Plan: Resume Amlodipine 10mg  daily today Discontinued lisinopril due to cough Start Losartan-HCTZ 50-12.5mg  daily after her Monday Aldo/renin lab and BP recheck Will likely need her carvedilol restarted as well.

## 2018-05-20 NOTE — Progress Notes (Signed)
   CC: HTN  HPI:[name] is a 43 y.o. female who presents for evaluation of HTN. Please see individual problem based A/P for details.  PHQ-9: Based on the patients    Office Visit from 05/20/2018 in [place]  PHQ-9 Total Score  0     score we have decided to monitor.  Past Medical History:  Diagnosis Date  . Hepatitis C   . HFrEF (heart failure with reduced ejection fraction) ([place])   . Hypertension    Review of Systems:  ROS negative except as per HPI.  Physical Exam: Vitals:   05/20/18 0900 05/20/18 0907  BP: (!) 169/105 (!) 172/100  Pulse: 86   Temp: 98.5 F (36.9 C)   TempSrc: Oral   SpO2: 100%   Weight: 161 lb 4.8 oz (73.2 kg)   Height: 5\' 1"  (1.549 m)    General: A/O x4, in no acute distress, afebrile, nondiaphoretic Cardio: RRR, no mrg's Pulmonary: CTA bilaterally MSK: BLE nontender, nonedematous  Assessment & Plan:   See Encounters Tab for problem based charting.  Patient discussed with Dr. Dareen Piano

## 2018-05-20 NOTE — Patient Instructions (Signed)
FOLLOW-UP INSTRUCTIONS When: On Monday January 27th for a blood pressure check and lab. Please start taking the amlodipine (Norvasc) 10mg  daily as soon as you pick up your medications today. Please do not take the other medication until we see you on Monday after the lab. We will have you return again in four weeks for a lab draw after Mondays visit.   Start Amlodipine 10mg  daily today.  Pick up but don't start the Combo HCTZ-Losartan tablet until after Mondays visit and lab.  Thank you for your visit to the [facility] today. If you have any questions or concerns please call us at [phone].

## 2018-05-21 NOTE — Progress Notes (Signed)
Internal Medicine Clinic Attending  Case discussed with Dr. Harbrecht at the time of the visit.  We reviewed the resident's history and exam and pertinent patient test results.  I agree with the assessment, diagnosis, and plan of care documented in the resident's note.   

## 2018-05-25 ENCOUNTER — Encounter: Payer: Self-pay | Admitting: Internal Medicine

## 2018-05-25 ENCOUNTER — Other Ambulatory Visit: Payer: Self-pay

## 2018-05-25 ENCOUNTER — Telehealth: Payer: Self-pay | Admitting: *Deleted

## 2018-05-25 ENCOUNTER — Ambulatory Visit (INDEPENDENT_AMBULATORY_CARE_PROVIDER_SITE_OTHER): Payer: Self-pay | Admitting: Internal Medicine

## 2018-05-25 VITALS — BP 151/97 | HR 99 | Temp 98.6°F | Ht 61.0 in | Wt 161.0 lb

## 2018-05-25 DIAGNOSIS — Z79899 Other long term (current) drug therapy: Secondary | ICD-10-CM

## 2018-05-25 DIAGNOSIS — I10 Essential (primary) hypertension: Secondary | ICD-10-CM

## 2018-05-25 MED ORDER — LOSARTAN POTASSIUM 50 MG PO TABS: 50.0000 mg | ORAL_TABLET | Freq: Every day | ORAL | 11 refills | Status: DC

## 2018-05-25 MED FILL — LOSARTAN POTASSIUM 50 MG TA: 50 | 30 days supply | Qty: 30 | Fill #0

## 2018-05-25 NOTE — Progress Notes (Signed)
   CC: HTN   HPI:[name] is a 43 y.o. female who presents for evaluation of HTN. Please see individual problem based A/P for details.  PHQ-9: Based on the patients    Office Visit from 05/20/2018 in [place]  PHQ-9 Total Score  0     score we have decided to monitor.  Past Medical History:  Diagnosis Date  . Hepatitis C   . HFrEF (heart failure with reduced ejection fraction) ([place])   . Hypertension    Review of Systems:  ROS negative except as per HPI.  Physical Exam: Vitals:   05/25/18 0937  BP: (!) 151/97  Pulse: 99  Temp: 98.6 F (37 C)  TempSrc: Oral  SpO2: 98%  Weight: 161 lb (73 kg)  Height: 5\' 1"  (1.549 m)   General: A/O x4, in no acute distress, afebrile, nondiaphoretic Cardio: RRR, no mrg's Pulmonary: CTA bilaterally MSK: BLE nontender, nonedematous  Assessment & Plan:   See Encounters Tab for problem based charting.  Patient discussed with Dr. Dareen Piano

## 2018-05-25 NOTE — Progress Notes (Signed)
Internal Medicine Clinic Attending  Case discussed with Dr. Harbrecht at the time of the visit.  We reviewed the resident's history and exam and pertinent patient test results.  I agree with the assessment, diagnosis, and plan of care documented in the resident's note.   

## 2018-05-25 NOTE — Telephone Encounter (Signed)
Fax from [place] is on backorder; please send new rxs for Losartan and HCTZ if appropriate. Thanks

## 2018-05-25 NOTE — Patient Instructions (Addendum)
FOLLOW-UP INSTRUCTIONS When: three weeks for a blood pressure check and a lab to check your electrolytes called a BMP. Please also schedule an appointment in late May for a routine visit and PAP smear.  What to bring: All of your medications  Today we discussed blood pressure. I would like for your to start taking the Losartan tablet again after today. If you notice lightheadedness, weakness, confusion or dizziness, after starting this tablet, please stop the Amlodipine and notify me. I will notify you of the results of any labs from today's evaluation when available to me.   Thank you for your visit to the [facility] today. If you have any questions or concerns please call us at [phone].

## 2018-05-25 NOTE — Assessment & Plan Note (Addendum)
  HTN: Patient returns for evaluation today for hypertension.  She was previously on 5 medications for resistant hypertension but due to affordability issues and insurance unavailability she was not amenable to evaluation for the hypertension.  She was restarted on amlodipine 10 mg daily, and was to return today for lab work prior to reinitiation of losartan.  She endorses compliance with amlodipine and denies side effects including lightheadedness, dizziness, peripheral edema, weakness, or discomfort. BP today greatly improved at 151/97.  Plan: We will cautiously advance her antihypertensives today. Obtain aldosterone renin ratio. Amlodipine 10mg  daily Initiate losartan 50 mg daily Consider HCTZ 12.5 versus 25 mg at her next visit BP remains out of control Will need to obtain BMP in 4 weeks at her next BP check Lab has been ordered as future

## 2018-06-08 ENCOUNTER — Telehealth: Payer: Self-pay | Admitting: Internal Medicine

## 2018-06-08 NOTE — Telephone Encounter (Signed)
Pt is wanted to make sure she can take tylenol; pt contact [phone]

## 2018-06-16 NOTE — Progress Notes (Deleted)
   CC: high blood pressure  HPI:[name] is a 43 y.o. female who presents for evaluation of high blood pressure. Please see individual problem based A/P for details.  Hypertension: Patient's BP today is ***/*** with a goal of <140/80. The patient endorses adherence to their medications regimen. They denied, chest pain, headache, visual changes, lightheadedness, weakness, dizziness on standing, swelling in the feet or ankles. She has returned today for follow-up  Plan: Continue amlodipine 10mg  daily Continue losartan 50mg  daily Continue carvedilol 12.5mg  BID  Cervical cancer screening: Inquire as to if patient is agreeable to PAP today   Plan:  ***  Depression, PHQ-9: Based on the patients    Office Visit from 05/25/2018 in [place]  PHQ-9 Total Score  0     score we have ***.  Past Medical History:  Diagnosis Date  . Hepatitis C   . HFrEF (heart failure with reduced ejection fraction) ([place])   . Hypertension    Review of Systems:  *** ROS negative except as per HPI.  Physical Exam: There were no vitals filed for this visit. *** Assessment & Plan:   See Encounters Tab for problem based charting.  Patient {GC/GE:3044014::"discussed with","seen with"} Dr. {NAMES:[id number]::"Butcher","Granfortuna","E. Hoffman","Klima","Mullen","Narendra","Raines","Vincent"}

## 2018-06-17 ENCOUNTER — Encounter: Payer: Medicaid Other | Admitting: Internal Medicine

## 2018-06-17 MED FILL — AMLODIPINE BESYLATE 10 MG T: 10 | 30 days supply | Qty: 30 | Fill #1 | Status: TO

## 2018-06-17 MED FILL — LOSARTAN POTASSIUM 50 MG TA: 50 | 30 days supply | Qty: 30 | Fill #1

## 2018-07-01 NOTE — Addendum Note (Signed)
Addended by: Nicola Girt on: 07/01/2018 03:25 PM   Modules accepted: Orders

## 2018-07-13 ENCOUNTER — Encounter: Payer: Self-pay | Admitting: Internal Medicine

## 2018-07-13 ENCOUNTER — Other Ambulatory Visit: Payer: Self-pay

## 2018-07-13 ENCOUNTER — Ambulatory Visit: Payer: Self-pay | Admitting: Internal Medicine

## 2018-07-13 VITALS — BP 146/94 | HR 102 | Temp 98.7°F | Ht 61.0 in | Wt 162.4 lb

## 2018-07-13 DIAGNOSIS — I491 Atrial premature depolarization: Secondary | ICD-10-CM

## 2018-07-13 DIAGNOSIS — I493 Ventricular premature depolarization: Secondary | ICD-10-CM

## 2018-07-13 DIAGNOSIS — B1920 Unspecified viral hepatitis C without hepatic coma: Secondary | ICD-10-CM

## 2018-07-13 DIAGNOSIS — Z79899 Other long term (current) drug therapy: Secondary | ICD-10-CM

## 2018-07-13 DIAGNOSIS — I10 Essential (primary) hypertension: Secondary | ICD-10-CM

## 2018-07-13 DIAGNOSIS — B182 Chronic viral hepatitis C: Secondary | ICD-10-CM

## 2018-07-13 MED ORDER — LOSARTAN POTASSIUM 100 MG PO TABS: 100.0000 mg | ORAL_TABLET | Freq: Every day | ORAL | 2 refills | Status: DC

## 2018-07-13 NOTE — Assessment & Plan Note (Signed)
BP Readings from Last 3 Encounters:  07/13/18 (!) 146/94  05/25/18 (!) 151/97  05/20/18 (!) 172/100  History of resistant hypertension with difficulty obtaining previous medications due to insurance and affordability. Recently restarted on amlodipine 10 mg qd and losartan 50 mg qd. Blood pressure is decreased but continues to be elevated. On PE she has PACs or PVCs intermittently, around every 15 beats to a minute in between. She was told in the past that she has these as well. She has no chest pain, SOB, palpitations, weakness or dizziness. Will restart her coreg that she was on previously as her HR is elevated as well.   - BMP today - increase losartan to 100 MG qd   - restart coreg 12.5 MG bid  - f/u two months for BP check pending COVID situation - consider EKG at follow-up if she continues to have PVCs

## 2018-07-13 NOTE — Assessment & Plan Note (Signed)
Has been unable to get treatment in the past due to affordability. She is currently working on Print production planner and did not qualify for the orange card in the past. She does state that she should be obtaining insurance soon. She has not abdominal pain or tenderness on exam today.     - CMP today  - CBC, INR, HCV quant on follow-up if insurance and affordability allows  - if she is unable to obtain insurance, there are options through [facility] for reduction in labwork charge and an Fish farm manager for [employer] for patients that cannot afford medications which I think she would qualify for.

## 2018-07-13 NOTE — Patient Instructions (Signed)
Thank you for allowing [place] to provide your care today. Today we discussed your hypertension and hepatitis C.   I have ordered the following labs for you:   Complete metabolic panel and HIV testing   I will call if any are abnormal.    Today we made the following changes to your medications:   Please START taking  Carvedilol (COREG) 12.5 MG tablet - take one tablet in the morning   Increase your losartan to 100 MG - please take   Please follow-up in two months for blood pressure check.    Should you have any questions or concerns please call the internal medicine clinic at [phone].

## 2018-07-13 NOTE — Progress Notes (Signed)
   CC: blood pressure check  HPI:  [name] is a 43 y.o. with PMH as below.   Please see A&P for assessment of the patient's acute and chronic medical conditions.   Past Medical History:  Diagnosis Date  . Hepatitis C   . HFrEF (heart failure with reduced ejection fraction) ([place])   . Hypertension    Review of Systems:   Review of Systems  Constitutional: Negative for chills, fever and malaise/fatigue.  HENT: Negative for sinus pain and sore throat.   Respiratory: Negative for cough, shortness of breath and wheezing.   Cardiovascular: Negative for chest pain, palpitations and leg swelling.  Gastrointestinal: Negative for abdominal pain, constipation, diarrhea, nausea and vomiting.  Genitourinary: Negative for dysuria, frequency and urgency.  Neurological: Negative for dizziness and headaches.   Physical Exam:  Constitution: NAD, well nourished HENT: [place]/AT Eyes: no icterus or injection  Cardio: PACs or PVCs, no m/r/g  Respiratory: CTA, no w/r/r  Abdominal: NTTP, non-distended, no palpable liver edge, +BS  MSK: no edema, moving all extremities  Neuro: a&ox3, normal affect    Vitals:   07/13/18 1424  BP: (!) 146/94  Pulse: (!) 102  Temp: 98.7 F (37.1 C)  TempSrc: Oral  SpO2: 98%  Weight: 162 lb 6.4 oz (73.7 kg)  Height: 5\' 1"  (1.549 m)    Assessment & Plan:   See Encounters Tab for problem based charting.  Patient discussed with Dr. Angelia Mould

## 2018-07-14 LAB — CMP14 + ANION GAP
A/G RATIO: 1.4 (ref 1.2–2.2)
ALK PHOS: 79 IU/L (ref 39–117)
ALT: 80 IU/L — ABNORMAL HIGH (ref 0–32)
AST: 74 IU/L — ABNORMAL HIGH (ref 0–40)
Albumin: 4.4 g/dL (ref 3.8–4.8)
Anion Gap: 18 mmol/L (ref 10.0–18.0)
BILIRUBIN TOTAL: 0.4 mg/dL (ref 0.0–1.2)
BUN/Creatinine Ratio: 7 — ABNORMAL LOW (ref 9–23)
BUN: 4 mg/dL — AB (ref 6–24)
CALCIUM: 9.3 mg/dL (ref 8.7–10.2)
CHLORIDE: 100 mmol/L (ref 96–106)
CO2: 23 mmol/L (ref 20–29)
Creatinine, Ser: 0.54 mg/dL — ABNORMAL LOW (ref 0.57–1.00)
GFR calc Af Amer: 134 mL/min/{1.73_m2} (ref >59)
GFR, EST NON AFRICAN AMERICAN: 116 mL/min/{1.73_m2} (ref >59)
GLUCOSE: 71 mg/dL (ref 65–99)
Globulin, Total: 3.1 g/dL (ref 1.5–4.5)
Potassium: 3.9 mmol/L (ref 3.5–5.2)
SODIUM: 141 mmol/L (ref 134–144)
Total Protein: 7.5 g/dL (ref 6.0–8.5)

## 2018-07-14 LAB — HIV ANTIBODY (ROUTINE TESTING W REFLEX): HIV SCREEN 4TH GENERATION: NONREACTIVE

## 2018-07-14 NOTE — Progress Notes (Signed)
Internal Medicine Clinic Attending  Case discussed with Dr. Seawell at the time of the visit.  We reviewed the resident's history and exam and pertinent patient test results.  I agree with the assessment, diagnosis, and plan of care documented in the resident's note.    

## 2018-07-22 MED FILL — LOSARTAN POTASSIUM 100 MG T: 100 | 30 days supply | Qty: 30 | Fill #0

## 2018-07-22 MED FILL — AMLODIPINE BESYLATE 10 MG T: 10 | 30 days supply | Qty: 30 | Fill #0

## 2018-08-07 MED FILL — CARVEDILOL 12.5 MG TABLET: 12.5 | 30 days supply | Qty: 60 | Fill #0

## 2018-08-16 NOTE — Addendum Note (Signed)
Addended by: Nicola Girt on: 08/16/2018 10:46 AM   Modules accepted: Orders

## 2018-08-21 MED FILL — LOSARTAN POTASSIUM 100 MG T: 100 | 30 days supply | Qty: 30 | Fill #1

## 2018-08-21 MED FILL — AMLODIPINE BESYLATE 10 MG T: 10 | 30 days supply | Qty: 30 | Fill #1

## 2018-09-10 MED FILL — CARVEDILOL 12.5 MG TABLET: 12.5 | 30 days supply | Qty: 60 | Fill #1

## 2018-09-25 MED FILL — AMLODIPINE BESYLATE 10 MG T: 10 | 30 days supply | Qty: 30 | Fill #2

## 2018-09-25 MED FILL — LOSARTAN POTASSIUM 100 MG T: 100 | 30 days supply | Qty: 30 | Fill #2

## 2018-09-30 ENCOUNTER — Encounter: Payer: Medicaid Other | Admitting: Internal Medicine

## 2018-10-20 ENCOUNTER — Other Ambulatory Visit: Payer: Self-pay | Admitting: Internal Medicine

## 2018-10-20 DIAGNOSIS — I10 Essential (primary) hypertension: Secondary | ICD-10-CM

## 2018-10-20 MED FILL — CARVEDILOL 12.5 MG TABLET: 12.5 | 30 days supply | Qty: 60 | Fill #0

## 2018-10-20 MED FILL — AMLODIPINE BESYLATE 10 MG T: 10 | 30 days supply | Qty: 30 | Fill #0

## 2018-10-21 MED ORDER — LOSARTAN POTASSIUM 100 MG PO TABS: 100.0000 mg | ORAL_TABLET | Freq: Every day | ORAL | 1 refills | Status: DC

## 2018-10-21 MED FILL — LOSARTAN POTASSIUM 100 MG T: 100 | 30 days supply | Qty: 30 | Fill #0

## 2018-10-21 NOTE — Addendum Note (Signed)
Addended by: Nicola Girt on: 10/21/2018 02:46 PM   Modules accepted: Orders

## 2018-10-21 NOTE — Telephone Encounter (Signed)
Received call from [place] requesting Rx for losartan be resent with IM Program written in comments. They can no longer take VO for this. Hubbard Hartshorn, RN, BSN

## 2018-11-18 ENCOUNTER — Other Ambulatory Visit: Payer: Self-pay

## 2018-11-18 ENCOUNTER — Encounter: Payer: Self-pay | Admitting: Internal Medicine

## 2018-11-18 ENCOUNTER — Ambulatory Visit: Payer: Self-pay | Admitting: Internal Medicine

## 2018-11-18 ENCOUNTER — Encounter: Payer: Self-pay | Admitting: *Deleted

## 2018-11-18 VITALS — BP 169/105 | HR 65 | Temp 98.5°F | Wt 175.4 lb

## 2018-11-18 DIAGNOSIS — I10 Essential (primary) hypertension: Secondary | ICD-10-CM

## 2018-11-18 DIAGNOSIS — B1920 Unspecified viral hepatitis C without hepatic coma: Secondary | ICD-10-CM

## 2018-11-18 DIAGNOSIS — I5022 Chronic systolic (congestive) heart failure: Secondary | ICD-10-CM

## 2018-11-18 DIAGNOSIS — I5020 Unspecified systolic (congestive) heart failure: Secondary | ICD-10-CM

## 2018-11-18 DIAGNOSIS — Z79899 Other long term (current) drug therapy: Secondary | ICD-10-CM

## 2018-11-18 DIAGNOSIS — Z124 Encounter for screening for malignant neoplasm of cervix: Secondary | ICD-10-CM

## 2018-11-18 DIAGNOSIS — B182 Chronic viral hepatitis C: Secondary | ICD-10-CM

## 2018-11-18 DIAGNOSIS — I110 Hypertensive heart disease with heart failure: Secondary | ICD-10-CM

## 2018-11-18 MED ORDER — LOSARTAN POTASSIUM 100 MG PO TABS: 100.0000 mg | ORAL_TABLET | Freq: Every day | ORAL | 3 refills | Status: DC

## 2018-11-18 MED ORDER — CARVEDILOL 12.5 MG PO TABS: 12.5000 mg | ORAL_TABLET | Freq: Two times a day (BID) | ORAL | 3 refills | Status: DC

## 2018-11-18 MED ORDER — AMLODIPINE BESYLATE 10 MG PO TABS: 10.0000 mg | ORAL_TABLET | Freq: Every day | ORAL | 3 refills | Status: DC

## 2018-11-18 MED FILL — LOSARTAN POTASSIUM 100 MG T: 100 | 30 days supply | Qty: 30 | Fill #0

## 2018-11-18 MED FILL — AMLODIPINE BESYLATE 10 MG T: 10 | 30 days supply | Qty: 30 | Fill #0

## 2018-11-18 MED FILL — CARVEDILOL 12.5 MG TABLET: 12.5 | 30 days supply | Qty: 60 | Fill #0

## 2018-11-18 NOTE — Progress Notes (Addendum)
   CC: HTN and HCV  HPI:Ms.[name] L Hulsey is a 43 y.o. female who presents for evaluation of HTN and HCV. Please see individual problem based A/P for details.  Depression, PHQ-9: Based on the patients    Office Visit from 11/18/2018 in [place]  PHQ-9 Total Score  0     score we have decided to continue routine screening.  Past Medical History:  Diagnosis Date  . Hepatitis C   . HFrEF (heart failure with reduced ejection fraction) ([place])   . Hypertension    Review of Systems:   ROS negative except as per HPI.  Physical Exam: Vitals:   11/18/18 1022  BP: (!) 169/105  Pulse: 65  Temp: 98.5 F (36.9 C)  TempSrc: Oral  SpO2: 100%  Weight: 175 lb 6.4 oz (79.6 kg)   General: In no acute distress, afebrile, nondiaphoretic HEENT: PEERL, EMO intact Cardio: RRR, no mrg's  Pulmonary: CTA bilaterally, no wheezing or crackles  Abdomen: Bowel sounds normal, soft, nontender  MSK: BLE nontender, nonedematous Neuro: Alert, normal gait Psych: Appropriate affect, not depressed in appearance, engages well  Assessment & Plan:   See Encounters Tab for problem based charting.  Patient discussed with Dr. Rebeca Alert

## 2018-11-18 NOTE — Patient Instructions (Signed)
FOLLOW-UP INSTRUCTIONS When: 3-4 months For: Routine Visit What to bring: All of your medications  I have not made any changes to your medications today.   Today we discussed your high blood pressure and need for a low sodium diet which should be less than 2000mg  or 2g which are the same. In addition, we discussed your need for hepatitis C treatment. I will attempt to work on this for you but will need to you to call our financial counselor again to see if you qualify for any assistance programs. I will notify you of the results of any labs from today's evaluation when available to me.   Thank you for your visit to the [facility] today. If you have any questions or concerns please call us at [phone].

## 2018-11-18 NOTE — Assessment & Plan Note (Signed)
Hypertension: Patient's BP today is 169/105 with a goal of <140/80. The patient endorses poor adherence to her medication regimen. She did deny, chest pain, headache, visual changes, lightheadedness, weakness, dizziness on standing, swelling in the feet or ankles. She stated that she ran out of Losartan but that the pharmacy did not contact her to notify her of the available refill once this was completed.   Plan: Continue  Amlodipine 10mg  daily Continue Losartan 100mg  daily Continue carvedilol 12.5mg  BID CMP for renal function ordered today

## 2018-11-18 NOTE — Assessment & Plan Note (Addendum)
  HCV: Hepatitis screening panel positive 11 years ago as well as 5 years ago. HCV Quant ordered in April 2019 which demonstrated a persistently high count at 4,170,000 but for which she has not pursued treatment. She has notable affordability issues as well as lack of consistent follow-up. We will reach out to determine if she is a candidate for any treatment programs for a discounted/free rate.  Will need imaging at some point but due to a lack of insurance she is opting to forgo this today.  CMP today, Quant today  Plan: CMP today HCV quant repeated today Will attempt to initiate a treatment plan for her today  Addendum: CMP with persistently elevated AST/ALT HCV Quant pending

## 2018-11-18 NOTE — Assessment & Plan Note (Addendum)
The patient denied worsening dyspnea on exertion, orthopnea, pedal edema, or exercise intolerance. We discussed the need for medication adherence and a low sodium diet. I feel given her lack of symptoms that a repeat Echo may be warranted and will need to be discussed at her next visit. Weight is up 20lbs from a year ago with most of this occurring in the past 4 months which is consistent with her attestation of decreased access to exercise and increased dietary indiscretion from being indoors 2/2 to COVID restrictions. NYHA Class I-II.   Plan: Continue ARB and BB Consider repeat Echo at next visit

## 2018-11-18 NOTE — Assessment & Plan Note (Signed)
Will need to plan for this at her next visit.  

## 2018-11-19 LAB — CMP14 + ANION GAP
ALT: 61 IU/L — ABNORMAL HIGH (ref 0–32)
AST: 66 IU/L — ABNORMAL HIGH (ref 0–40)
Albumin/Globulin Ratio: 1.4 (ref 1.2–2.2)
Albumin: 4.4 g/dL (ref 3.8–4.8)
Alkaline Phosphatase: 70 IU/L (ref 39–117)
Anion Gap: 17 mmol/L (ref 10.0–18.0)
BUN/Creatinine Ratio: 11 (ref 9–23)
BUN: 7 mg/dL (ref 6–24)
Bilirubin Total: 0.6 mg/dL (ref 0.0–1.2)
CO2: 21 mmol/L (ref 20–29)
Calcium: 9.1 mg/dL (ref 8.7–10.2)
Chloride: 100 mmol/L (ref 96–106)
Creatinine, Ser: 0.63 mg/dL (ref 0.57–1.00)
GFR calc Af Amer: 127 mL/min/{1.73_m2} (ref >59)
GFR calc non Af Amer: 110 mL/min/{1.73_m2} (ref >59)
Globulin, Total: 3.1 g/dL (ref 1.5–4.5)
Glucose: 97 mg/dL (ref 65–99)
Potassium: 4.2 mmol/L (ref 3.5–5.2)
Sodium: 138 mmol/L (ref 134–144)
Total Protein: 7.5 g/dL (ref 6.0–8.5)

## 2018-11-20 LAB — HCV RNA QUANT
HCV log10: 6.754 log10 IU/mL
Hepatitis C Quantitation: 5670000 IU/mL

## 2018-11-20 NOTE — Progress Notes (Signed)
Internal Medicine Clinic Attending  Case discussed with Dr. Harbrecht at the time of the visit.  We reviewed the resident's history and exam and pertinent patient test results.  I agree with the assessment, diagnosis, and plan of care documented in the resident's note.  Alexander Raines, M.D., Ph.D.  

## 2018-11-27 ENCOUNTER — Telehealth: Payer: Self-pay | Admitting: Internal Medicine

## 2018-11-27 DIAGNOSIS — B182 Chronic viral hepatitis C: Secondary | ICD-10-CM

## 2018-11-27 MED ORDER — MAVYRET 100-40 MG PO TABS: 3.0000 | ORAL_TABLET | Freq: Every day | ORAL | 0 refills | Status: DC

## 2018-11-27 NOTE — Addendum Note (Signed)
Addended by: Nicola Girt on: 11/27/2018 08:10 AM   Modules accepted: Orders

## 2018-11-27 NOTE — Telephone Encounter (Signed)
Thanks. I spoke with Dr. Berline Lopes. We have the pharmaceutical assistance application in the [facility]. We will look at it together early next week. He will need to get a little more workup to be able to complete it which can be done next week as well.

## 2018-11-27 NOTE — Telephone Encounter (Signed)
Thank you both. I called and updated the patient. She is agreeable to a lab appointment in the near future once we are able to begin the assistance application.  I also attempted to call Adonis Brook at the pharmacy to inform her of the plan but there was no answer.  Thank you, Georgia Lopes

## 2018-11-27 NOTE — Telephone Encounter (Signed)
Rx for Joseph sent today would cost $16,000 to patient. Advised to contact [place] at [phone] to enrol patient in Assistance Program.   Call placed to Tracy at [employer]. No answer. Left message requesting return call. Will forward to Dr. Maudie Mercury and Attendings as they are familiar with Assistance program as well. Hubbard Hartshorn, RN, BSN

## 2018-11-27 NOTE — Telephone Encounter (Signed)
Rec'd phone call from [name] @ the [facility].  Pt can not afford this medication as it cost too much.  Please call [name] back to discuss options.

## 2018-11-30 NOTE — Telephone Encounter (Signed)
Sorry about that Dr. Berline Lopes. I did not realize patient has family planning Medicaid.

## 2018-12-07 ENCOUNTER — Telehealth: Payer: Self-pay | Admitting: Internal Medicine

## 2018-12-07 NOTE — Telephone Encounter (Signed)
Pt missed call on Friday, pls return call [phone]

## 2018-12-07 NOTE — Telephone Encounter (Signed)
Unsure who called pt . Called pt - stated it could had been her doctor about when she needs to come in for blood work.  Told her, I will let her doctor know she had called.

## 2018-12-09 ENCOUNTER — Ambulatory Visit: Payer: Medicaid Other

## 2018-12-14 MED FILL — CARVEDILOL 12.5 MG TABLET: 12.5 | 30 days supply | Qty: 60 | Fill #1

## 2018-12-14 MED FILL — AMLODIPINE BESYLATE 10 MG T: 10 | 30 days supply | Qty: 30 | Fill #1

## 2018-12-14 MED FILL — LOSARTAN POTASSIUM 100 MG T: 100 | 30 days supply | Qty: 30 | Fill #1

## 2018-12-15 NOTE — Telephone Encounter (Signed)
I am not certain who contacted her.

## 2019-01-19 MED FILL — AMLODIPINE BESYLATE 10 MG T: 10 | 30 days supply | Qty: 30 | Fill #2

## 2019-01-19 MED FILL — CARVEDILOL 12.5 MG TABLET: 12.5 | 30 days supply | Qty: 60 | Fill #2

## 2019-01-19 MED FILL — LOSARTAN POTASSIUM 100 MG T: 100 | 30 days supply | Qty: 30 | Fill #2

## 2019-02-17 MED FILL — LOSARTAN POTASSIUM 100 MG T: 100 | 30 days supply | Qty: 30 | Fill #3

## 2019-02-17 MED FILL — CARVEDILOL 12.5 MG TABLET: 12.5 | 30 days supply | Qty: 60 | Fill #3

## 2019-02-17 MED FILL — AMLODIPINE BESYLATE 10 MG T: 10 | 30 days supply | Qty: 30 | Fill #3

## 2019-02-24 ENCOUNTER — Encounter: Payer: Medicaid Other | Admitting: Internal Medicine

## 2019-03-03 ENCOUNTER — Encounter: Payer: Medicaid Other | Admitting: Internal Medicine

## 2019-03-03 NOTE — Progress Notes (Deleted)
   CC: ***  HPI:[name] is a 43 y.o. female who presents for evaluation of ***. Please see individual problem based A/P for details.  Hypertension: Patient's BP today is ***/*** with a goal of <140/80. The patient endorses adherence to *** medication regimen. *** denied, chest pain, headache, visual changes, lightheadedness, weakness, dizziness on standing, swelling in the feet or ankles.   Plan: Continue *** ***mg *** Continue *** ***mg daily Continue *** ***mg BID  Hepatitis C: *** Complete application for assistance.   Plan: ***  ***: ***  Plan:  ***  ***: ***  Plan:  ***  Depression, PHQ-9: Based on the patients    Office Visit from 11/18/2018 in [place]  PHQ-9 Total Score  0     score we have ***.  Past Medical History:  Diagnosis Date  . Hepatitis C   . HFrEF (heart failure with reduced ejection fraction) ([place])   . Hypertension    Review of Systems:  *** ROS negative except as per HPI.  Physical Exam: There were no vitals filed for this visit. *** Assessment & Plan:   See Encounters Tab for problem based charting.  Patient {GC/GE:3044014::"discussed with","seen with"} Dr. {NAMES:3044014::"Butcher","Granfortuna","E. Hoffman","Klima","Mullen","Narendra","Raines","Vincent"}

## 2019-03-23 MED FILL — CARVEDILOL 12.5 MG TABLET: 12.5 | 30 days supply | Qty: 60 | Fill #4

## 2019-03-23 MED FILL — LOSARTAN POTASSIUM 100 MG T: 100 | 30 days supply | Qty: 30 | Fill #4

## 2019-03-23 MED FILL — AMLODIPINE BESYLATE 10 MG T: 10 | 30 days supply | Qty: 30 | Fill #4

## 2019-04-26 MED FILL — AMLODIPINE BESYLATE 10 MG T: 10 | 30 days supply | Qty: 30 | Fill #5

## 2019-04-26 MED FILL — CARVEDILOL 12.5 MG TABLET: 12.5 | 30 days supply | Qty: 60 | Fill #5

## 2019-04-26 MED FILL — LOSARTAN POTASSIUM 100 MG T: 100 | 30 days supply | Qty: 30 | Fill #5

## 2019-05-24 MED FILL — LOSARTAN POTASSIUM 100 MG T: 100 | 30 days supply | Qty: 30 | Fill #6

## 2019-05-24 MED FILL — AMLODIPINE BESYLATE 10 MG T: 10 | 30 days supply | Qty: 30 | Fill #6

## 2019-05-24 MED FILL — CARVEDILOL 12.5 MG TABLET: 12.5 | 30 days supply | Qty: 60 | Fill #6

## 2019-06-22 MED FILL — LOSARTAN POTASSIUM 100 MG T: 100 | 30 days supply | Qty: 30 | Fill #7

## 2019-06-22 MED FILL — CARVEDILOL 12.5 MG TABLET: 12.5 | 30 days supply | Qty: 60 | Fill #7

## 2019-06-22 MED FILL — AMLODIPINE BESYLATE 10 MG T: 10 | 30 days supply | Qty: 30 | Fill #7

## 2019-07-07 ENCOUNTER — Telehealth: Payer: Self-pay

## 2019-07-07 ENCOUNTER — Encounter: Payer: Medicaid Other | Admitting: Internal Medicine

## 2019-07-07 NOTE — Telephone Encounter (Signed)
Patient did not show for her appointment, I tried to call patient back but mailbox is full not able to leave message Silverio Decamp C3/10/20213:43 PM

## 2019-07-13 ENCOUNTER — Other Ambulatory Visit: Payer: Self-pay | Admitting: Internal Medicine

## 2019-07-13 DIAGNOSIS — Z1231 Encounter for screening mammogram for malignant neoplasm of breast: Secondary | ICD-10-CM

## 2019-07-21 ENCOUNTER — Ambulatory Visit: Payer: Self-pay | Admitting: Internal Medicine

## 2019-07-21 VITALS — BP 159/103 | HR 77 | Temp 98.8°F | Ht 62.0 in | Wt 168.9 lb

## 2019-07-21 DIAGNOSIS — Z803 Family history of malignant neoplasm of breast: Secondary | ICD-10-CM

## 2019-07-21 DIAGNOSIS — Z79899 Other long term (current) drug therapy: Secondary | ICD-10-CM

## 2019-07-21 DIAGNOSIS — N6320 Unspecified lump in the left breast, unspecified quadrant: Secondary | ICD-10-CM

## 2019-07-21 DIAGNOSIS — B1920 Unspecified viral hepatitis C without hepatic coma: Secondary | ICD-10-CM

## 2019-07-21 DIAGNOSIS — I10 Essential (primary) hypertension: Secondary | ICD-10-CM

## 2019-07-21 DIAGNOSIS — B182 Chronic viral hepatitis C: Secondary | ICD-10-CM

## 2019-07-21 NOTE — Progress Notes (Signed)
   CC: Breast lump and HTn  HPI:Ms.[name] L Kerstein is a 44 y.o. female who presents for evaluation of Breat Lump and HTn. Please see individual problem based A/P for details.  Depression, PHQ-9: Based on the patients    Office Visit from 07/21/2019 in [place]  PHQ-9 Total Score  0     score we have decided to continue monitoring.  Past Medical History:  Diagnosis Date  . Hepatitis C   . HFrEF (heart failure with reduced ejection fraction) ([place])   . Hypertension    Review of Systems:  ROS negative except as per HPI.  Physical Exam: Vitals:   07/21/19 1511  BP: (!) 159/103  Pulse: 77  Temp: 98.8 F (37.1 C)  TempSrc: Oral  SpO2: 100%  Weight: 168 lb 14.4 oz (76.6 kg)  Height: 5\' 2"  (1.575 m)   Filed Weights   07/21/19 1511  Weight: 168 lb 14.4 oz (76.6 kg)   General: A/O x4, in no acute distress, afebrile, nondiaphoretic HEENT: PEERL, EMO intact Cardio: RRR, no mrg's  Breast exam as per A/P for breast lump Pulmonary: CTA bilaterally,  no wheezing or crackles  MSK: BLE nontender, nonedematous Psych: Appropriate affect, not depressed in appearance, engages well  Assessment & Plan:   See Encounters Tab for problem based charting.  Patient discussed with Dr. Philipp Ovens

## 2019-07-21 NOTE — Patient Instructions (Signed)
FOLLOW-UP INSTRUCTIONS When: 3-4 months For: Routine appointment What to bring: All of your medications  I have not made any changes to your medications today.   Today we discussed your high blood pressure, breast lump and Hep C. I recommend that you continue your current medications for your blood pressure and that we follow-up in three monhts. I will notify you of the results of any labs from today's evaluation when available to me.   For you breast lump I have referred you to the GI center for a Urgent Mammogram.  For the Hep C, since the medication was not approved, I will refer you to the [place] clinic to assist with completing a assistance form.   Thank you for your visit to the [facility] today. If you have any questions or concerns please call us at [phone].

## 2019-07-22 ENCOUNTER — Encounter: Payer: Self-pay | Admitting: Internal Medicine

## 2019-07-22 ENCOUNTER — Other Ambulatory Visit: Payer: Self-pay | Admitting: Internal Medicine

## 2019-07-22 DIAGNOSIS — N6320 Unspecified lump in the left breast, unspecified quadrant: Secondary | ICD-10-CM

## 2019-07-22 HISTORY — DX: Unspecified lump in the left breast, unspecified quadrant: N63.20

## 2019-07-22 LAB — BMP8+ANION GAP
Anion Gap: 13 mmol/L (ref 10.0–18.0)
BUN/Creatinine Ratio: 15 (ref 9–23)
BUN: 9 mg/dL (ref 6–24)
CO2: 25 mmol/L (ref 20–29)
Calcium: 9.5 mg/dL (ref 8.7–10.2)
Chloride: 100 mmol/L (ref 96–106)
Creatinine, Ser: 0.59 mg/dL (ref 0.57–1.00)
GFR calc Af Amer: 129 mL/min/{1.73_m2} (ref >59)
GFR calc non Af Amer: 112 mL/min/{1.73_m2} (ref >59)
Glucose: 100 mg/dL — ABNORMAL HIGH (ref 65–99)
Potassium: 4.2 mmol/L (ref 3.5–5.2)
Sodium: 138 mmol/L (ref 134–144)

## 2019-07-22 NOTE — Assessment & Plan Note (Addendum)
Hepatitis C: Patient with positive Hep C RNA testing. Reflex testing for speciation ordered but has not been completed due to cost concerns. She remains in need of additional testing and treatment. She was lost to treatment due to cost concerns from lack of insurance and personal life difficulties. A prescription was sent for Mavyret 100-40 but she was not able to pick this up. Unfortunately, this remains untreated.   Plan:  We will refer her to the [place] clinic for assistance with obtaining coverage for lab testing and treatment of her Hep C.

## 2019-07-22 NOTE — Progress Notes (Signed)
Internal Medicine Clinic Attending  Case discussed with Dr. Harbrecht at the time of the visit.  We reviewed the resident's history and exam and pertinent patient test results.  I agree with the assessment, diagnosis, and plan of care documented in the resident's note.   

## 2019-07-22 NOTE — Assessment & Plan Note (Signed)
Hypertension: Patient's BP today is 159/103 with a goal of <140/80. The patient endorses adherence to her medication regimen. She denied, chest pain, headache, visual changes, lightheadedness, weakness, dizziness on standing, swelling in the feet or ankles. I am no certain she has had good adherence to her treatment plan due to several personal challenges. Given the personal difficulties that she has faced we discussed continuing her current medications with better adherence and returning for repeat BP check at her next visit.   Plan: Continue Amlodipien 10mg  daily Continue Losartan 100mg  daily Continue carvedilol 12.5mg  BID BMP today with sCr of 0.59 and potassium of 4.2

## 2019-07-22 NOTE — Assessment & Plan Note (Signed)
Breast lump: Patient noted a large lump in her left breast about four days prior that is mildly tender to palpation. She has been under a great deal of stress of recent but did not recall this mass being present before. She reports that her half-sister through her father was diagnosed with a form of breast cancer at an unknown age which is her only known family history of breast cancer. The mass was initially visible with gentle palpation and was first noted when she began her menstrual cycle this month. Over the past four days the mass has reduced in size by a factor or four and her menstrual cycle is near its end. She denied skin changes, severe pain, fever, chills, radiating pain, trauma to the area or breast discharge.  Exam:  Roughly a 3x3cm by 1.5cm deep, hard, irregular, highly mobile, breast mass at the 2 o'clock position with regard to the nipple. The mass is mildly tender and is not associated with obvious lymphadenopathy in the breast, axillar or sub/supraclavicularly.   She is a smoker of ~1/2ppd for >20 yrs with a family history of breast cancer. The mass is tender and varied in size with her menstrual cycle.   Plan: Urgent referral for diagnostic mammogram placed

## 2019-07-23 ENCOUNTER — Other Ambulatory Visit: Payer: Self-pay

## 2019-07-23 DIAGNOSIS — N6320 Unspecified lump in the left breast, unspecified quadrant: Secondary | ICD-10-CM

## 2019-07-23 MED FILL — LOSARTAN POTASSIUM 100 MG T: 100 | 30 days supply | Qty: 30 | Fill #8

## 2019-07-23 MED FILL — CARVEDILOL 12.5 MG TABLET: 12.5 | 30 days supply | Qty: 60 | Fill #8

## 2019-07-23 MED FILL — AMLODIPINE BESYLATE 10 MG T: 10 | 30 days supply | Qty: 30 | Fill #8

## 2019-07-27 ENCOUNTER — Encounter: Payer: Medicaid Other | Admitting: Family

## 2019-07-27 ENCOUNTER — Other Ambulatory Visit: Payer: Self-pay | Admitting: Obstetrics and Gynecology

## 2019-07-27 DIAGNOSIS — N6320 Unspecified lump in the left breast, unspecified quadrant: Secondary | ICD-10-CM

## 2019-07-27 NOTE — Addendum Note (Signed)
Addended by: Hulan Fray on: 07/27/2019 06:04 PM   Modules accepted: Orders

## 2019-07-29 ENCOUNTER — Other Ambulatory Visit: Payer: Medicaid Other

## 2019-07-29 ENCOUNTER — Ambulatory Visit: Payer: Medicaid Other

## 2019-08-20 ENCOUNTER — Telehealth: Payer: Self-pay | Admitting: Pharmacy Technician

## 2019-08-20 ENCOUNTER — Ambulatory Visit (INDEPENDENT_AMBULATORY_CARE_PROVIDER_SITE_OTHER): Payer: Self-pay | Admitting: Family

## 2019-08-20 ENCOUNTER — Encounter: Payer: Self-pay | Admitting: Family

## 2019-08-20 ENCOUNTER — Other Ambulatory Visit: Payer: Self-pay

## 2019-08-20 VITALS — BP 150/112 | HR 72 | Temp 97.8°F | Wt 170.0 lb

## 2019-08-20 DIAGNOSIS — I10 Essential (primary) hypertension: Secondary | ICD-10-CM

## 2019-08-20 DIAGNOSIS — B182 Chronic viral hepatitis C: Secondary | ICD-10-CM

## 2019-08-20 NOTE — Assessment & Plan Note (Signed)
Blood pressure elevated above goal of 140/90 today.  No red flag/warning symptoms of concern at present including headache or blurred vision.  Recommend continued blood pressure medications with losartan, carvedilol, amlodipine with changes per primary care.  If blood pressure remains elevated advised to seek further medical care.

## 2019-08-20 NOTE — Telephone Encounter (Signed)
[facility] Patient Advocate Encounter    Findings of the benefits investigation:   Insurance: uninsured Pending insurance from her new employer but will continue with patient assistance for timing  Estimated copay amount: $0  She signed both applications for patient assistance and will send once labs return and medication selected. She is currently in a hotel but will be relocating at some point so we will mark the application ship to clinic. She [place] HHS-1 and $0  Bartholomew Crews, CPhT Specialty Pharmacy Patient [facility] for Infectious Disease Phone: [phone] Fax: [phone] 08/20/2019 9:50 AM

## 2019-08-20 NOTE — Assessment & Plan Note (Signed)
[name] is a 44 year old female with chronic hepatitis C with most recent viral load of 5.67 million in July 2020.  She is treatment nave and currently asymptomatic.  We discussed the pathogenesis, transmission, prevention, risks if left untreated, and treatment options for hepatitis C.  Check genotype, hepatitis C RNA level, fibrosis score, HIV antibody test, CBC, hepatitis B status, and liver function tests.  She met with pharmacy staff to complete financial assistance paperwork.  Plan for [place] or Epclusa pending blood work results.

## 2019-08-20 NOTE — Patient Instructions (Addendum)
Nice to see you.  We will check your blood work today.  Once we have the results we will let you know what the plan of care will be.  Plan for follow-up 1 month after starting medication.  Limit acetaminophen (Tylenol) usage to no more than 2 grams (2,000 mg) per day.  Avoid alcohol.  Do not share toothbrushes or razors.  Practice safe sex to protect against transmission as well as sexually transmitted disease.    Hepatitis C Hepatitis C is a viral infection of the liver. It can lead to scarring of the liver (cirrhosis), liver failure, or liver cancer. Hepatitis C may go undetected for months or years because people with the infection may not have symptoms, or they may have only mild symptoms. What are the causes? This condition is caused by the hepatitis C virus (HCV). The virus can spread from person to person (is contagious) through:  Blood.  Childbirth. A woman who has hepatitis C can pass it to her baby during birth.  Bodily fluids, such as breast milk, tears, semen, vaginal fluids, and saliva.  Blood transfusions or organ transplants done in the [place] before 1992.  What increases the risk? The following factors may make you more likely to develop this condition:  Having contact with unclean (contaminated) needles or syringes. This may result from: ? Acupuncture. ? Tattoing. ? Body piercing. ? Injecting drugs.  Having unprotected sex with someone who is infected.  Needing treatment to filter your blood (kidney dialysis).  Having HIV (human immunodeficiency virus) or AIDS (acquired immunodeficiency syndrome).  Working in a job that involves contact with blood or bodily fluids, such as health care.  What are the signs or symptoms? Symptoms of this condition include:  Fatigue.  Loss of appetite.  Nausea.  Vomiting.  Abdominal pain.  Dark yellow urine.  Yellowish skin and eyes (jaundice).  Itchy skin.  Clay-colored bowel movements.  Joint  pain.  Bleeding and bruising easily.  Fluid building up in your stomach (ascites).  In some cases, you may not have any symptoms. How is this diagnosed? This condition is diagnosed with:  Blood tests.  Other tests to check how well your liver is functioning. They may include: ? Magnetic resonance elastography (MRE). This imaging test uses MRIs and sound waves to measure liver stiffness. ? Transient elastography. This imaging test uses ultrasounds to measure liver stiffness. ? Liver biopsy. This test requires taking a small tissue sample from your liver to examine it under a microscope.  How is this treated? Your health care provider may perform noninvasive tests or a liver biopsy to help decide the best course of treatment. Treatment may include:  Antiviral medicines and other medicines.  Follow-up treatments every 6-12 months for infections or other liver conditions.  Receiving a donated liver (liver transplant).  Follow these instructions at home: Medicines  Take over-the-counter and prescription medicines only as told by your health care provider.  Take your antiviral medicine as told by your health care provider. Do not stop taking the antiviral even if you start to feel better.  Do not take any medicines unless approved by your health care provider, including over-the-counter medicines and birth control pills. Activity  Rest as needed.  Do not have sex unless approved by your health care provider.  Ask your health care provider when you may return to school or work. Eating and drinking  Eat a balanced diet with plenty of fruits and vegetables, whole grains, and lowfat (lean) meats  or non-meat proteins (such as beans or tofu).  Drink enough fluids to keep your urine clear or pale yellow.  Do not drink alcohol. General instructions  Do not share toothbrushes, nail clippers, or razors.  Wash your hands frequently with soap and water. If soap and water are not  available, use hand sanitizer.  Cover any cuts or open sores on your skin to prevent spreading the virus.  Keep all follow-up visits as told by your health care provider. This is important. You may need follow-up visits every 6-12 months. How is this prevented? There is no vaccine for hepatitis C. The only way to prevent the disease is to reduce the risk of exposure to the virus. Make sure you:  Wash your hands frequently with soap and water. If soap and water are not available, use hand sanitizer.  Do not share needles or syringes.  Practice safe sex and use condoms.  Avoid handling blood or bodily fluids without gloves or other protection.  Avoid getting tattoos or piercings in shops or other locations that are not clean.  Contact a health care provider if:  You have a fever.  You develop abdominal pain.  You pass dark urine.  You pass clay-colored stools.  You develop joint pain. Get help right away if:  You have increasing fatigue or weakness.  You lose your appetite.  You cannot eat or drink without vomiting.  You develop jaundice or your jaundice gets worse.  You bruise or bleed easily. Summary  Hepatitis C is a viral infection of the liver. It can lead to scarring of the liver (cirrhosis), liver failure, or liver cancer.  The hepatitis C virus (HCV) causes this condition. The virus can pass from person to person (is contagious).  You should not take any medicines unless approved by your health care provider. This includes over-the-counter medicines and birth control pills. This information is not intended to replace advice given to you by your health care provider. Make sure you discuss any questions you have with your health care provider. Document Released: 04/12/2000 Document Revised: 05/21/2016 Document Reviewed: 05/21/2016 Elsevier Interactive Patient Education  [employer].

## 2019-08-20 NOTE — Progress Notes (Signed)
Subjective:    Patient ID: [name], female    DOB: January 02, 1976, 44 y.o.   MRN: [id number]  Chief Complaint  Patient presents with  . Hepatitis C    HPI:  [name] is a 44 y.o. female with previous medical history of hypertension, heart failure with reduced ejection fraction, and Chronic Hepatitis C presenting today for initial evaluation and treatment of Hepatitis C.  [name] was initially seen for Chronic Hepatitis C on 11/18/18 with initial diagnosis of Hepatitis C about 11 years prior to her visit. RNA level in April 2019 remained present at 4.17 million IU/ml. She was untreated at the time due to financial concerns. Most recently seen on 07/21/19 and remained in need of treatment with concern for lack of insurance and personal life difficulties. A prescription for Mavyret was given but not able to be started again secondary to financial concerns. She has been referred to [facility] for treatment. Most recent RNA level obtained was 11/18/18 at 5.67 million IU/ml.   [name] was initially diagnosed with hepatitis C when attempting to donate plasma.  No significant risk factors for hepatitis C and denies history of IV drug use, tattoos, sharing of razors/toothbrushes, or known sexual contact with positive partner.  No previous family or personal history of liver disease.  Currently asymptomatic and denies abdominal pain, nausea, vomiting, scleral icterus, and jaundice.  She has not received treatment to date.  She does use marijuana 1-2 times per week on average, is a 1/4 pack/day tobacco smoker and drinks about 4 alcoholic drinks per day on average.   No Known Allergies    Outpatient Medications Prior to Visit  Medication Sig Dispense Refill  . amLODipine (NORVASC) 10 MG tablet Take 1 tablet (10 mg total) by mouth daily. IM PROGRAM 90 tablet 3  . carvedilol (COREG) 12.5 MG tablet Take 1 tablet (12.5 mg total) by mouth 2 (two) times daily with a meal. 180 tablet 3  . losartan  (COZAAR) 100 MG tablet Take 1 tablet (100 mg total) by mouth daily. 90 tablet 3  . Glecaprevir-Pibrentasvir (MAVYRET) 100-40 MG TABS Take 3 tablets by mouth daily. For eight weeks. You must not miss a dose. 168 tablet 0   No facility-administered medications prior to visit.     Past Medical History:  Diagnosis Date  . Hepatitis C   . HFrEF (heart failure with reduced ejection fraction) ([place])   . Hypertension       Past Surgical History:  Procedure Laterality Date  . LEFT AND RIGHT HEART CATHETERIZATION WITH CORONARY ANGIOGRAM N/A 06/23/2013   Procedure: LEFT AND RIGHT HEART CATHETERIZATION WITH CORONARY ANGIOGRAM;  Surgeon: Birdie Riddle, MD;  Location: [facility];  Service: Cardiovascular;  Laterality: N/A;  . LIVER BIOPSY    . LIVER BIOPSY        Family History  Problem Relation Age of Onset  . Hypertension Mother   . Hyperlipidemia Mother   . Diabetes Mother   . Hepatitis C Mother   . Diabetes Maternal Aunt       Social History   Socioeconomic History  . Marital status: Single    Spouse name: Not on file  . Number of children: Not on file  . Years of education: Not on file  . Highest education level: Not on file  Occupational History  . Not on file  Tobacco Use  . Smoking status: Current Some Day Smoker    Packs/day: 0.25  Types: Cigarettes  . Smokeless tobacco: Never Used  . Tobacco comment: 6-7 per day ./  has Vaped   Substance and Sexual Activity  . Alcohol use: Yes    Alcohol/week: 28.0 standard drinks    Types: 28 Standard drinks or equivalent per week  . Drug use: Yes    Frequency: 0.5 times per week    Types: Marijuana  . Sexual activity: Not on file  Other Topics Concern  . Not on file  Social History Narrative  . Not on file   Social Determinants of Health   Financial Resource Strain:   . Difficulty of Paying Living Expenses:   Food Insecurity:   . Worried About Charity fundraiser in the Last Year:   . Arboriculturist in the Last  Year:   Transportation Needs:   . Film/video editor (Medical):   Marland Kitchen Lack of Transportation (Non-Medical):   Physical Activity:   . Days of Exercise per Week:   . Minutes of Exercise per Session:   Stress:   . Feeling of Stress :   Social Connections:   . Frequency of Communication with Friends and Family:   . Frequency of Social Gatherings with Friends and Family:   . Attends Religious Services:   . Active Member of Clubs or Organizations:   . Attends Archivist Meetings:   Marland Kitchen Marital Status:   Intimate Partner Violence:   . Fear of Current or Ex-Partner:   . Emotionally Abused:   Marland Kitchen Physically Abused:   . Sexually Abused:       Review of Systems  Constitutional: Negative for chills, fatigue, fever and unexpected weight change.  Respiratory: Negative for cough, chest tightness, shortness of breath and wheezing.   Cardiovascular: Negative for chest pain and leg swelling.  Gastrointestinal: Negative for abdominal distention, constipation, diarrhea, nausea and vomiting.  Neurological: Negative for dizziness, weakness, light-headedness and headaches.  Hematological: Does not bruise/bleed easily.       Objective:    BP (!) 150/112   Pulse 72   Temp 97.8 F (36.6 C) (Oral)   Wt 170 lb (77.1 kg)   BMI 31.09 kg/m  Nursing note and vital signs reviewed.  Physical Exam Constitutional:      General: She is not in acute distress.    Appearance: She is well-developed.  Cardiovascular:     Rate and Rhythm: Normal rate and regular rhythm.     Heart sounds: Normal heart sounds. No murmur. No friction rub. No gallop.   Pulmonary:     Effort: Pulmonary effort is normal. No respiratory distress.     Breath sounds: Normal breath sounds. No wheezing or rales.  Chest:     Chest wall: No tenderness.  Abdominal:     General: Bowel sounds are normal. There is no distension.     Palpations: Abdomen is soft. There is no mass.     Tenderness: There is no abdominal  tenderness. There is no guarding or rebound.  Skin:    General: Skin is warm and dry.  Neurological:     Mental Status: She is alert and oriented to person, place, and time.  Psychiatric:        Behavior: Behavior normal.        Thought Content: Thought content normal.        Judgment: Judgment normal.         Assessment & Plan:   Patient Active Problem List   Diagnosis Date Noted  .  Left breast mass 07/22/2019  . Cervical cancer screening 09/30/2017  . Alcohol use 08/01/2017  . Heart failure with reduced ejection fraction ([place]) 08/01/2017  . Hepatitis C 11/02/2007  . Tobacco use disorder 06/18/2007  . Essential hypertension 04/27/2007     Problem List Items Addressed This Visit      Cardiovascular and Mediastinum   Essential hypertension (Chronic)    Blood pressure elevated above goal of 140/90 today.  No red flag/warning symptoms of concern at present including headache or blurred vision.  Recommend continued blood pressure medications with losartan, carvedilol, amlodipine with changes per primary care.  If blood pressure remains elevated advised to seek further medical care.        Digestive   Hepatitis C - Primary    Ms. Maj is a 44 year old female with chronic hepatitis C with most recent viral load of 5.67 million in July 2020.  She is treatment nave and currently asymptomatic.  We discussed the pathogenesis, transmission, prevention, risks if left untreated, and treatment options for hepatitis C.  Check genotype, hepatitis C RNA level, fibrosis score, HIV antibody test, CBC, hepatitis B status, and liver function tests.  She met with pharmacy staff to complete financial assistance paperwork.  Plan for [place] or Epclusa pending blood work results.      Relevant Orders   Hepatic function panel   Hepatitis C genotype   Hepatitis C RNA quantitative   Liver Fibrosis, FibroTest-ActiTest   Protime-INR   Hepatitis B surface antibody,qualitative   Hepatitis B  surface antigen   HIV antibody (with reflex)   CBC   Basic metabolic panel      I have discontinued [place] [facility]'s Mavyret. I am also having her maintain her amLODipine, carvedilol, and losartan.   Follow-up: Pending blood work results.   Terri Piedra, MSN, FNP-C Nurse Practitioner [facility] for Infectious Disease [place] number: [phone]

## 2019-08-24 MED FILL — AMLODIPINE BESYLATE 10 MG T: 10 | 30 days supply | Qty: 30 | Fill #9

## 2019-08-24 MED FILL — LOSARTAN POTASSIUM 100 MG T: 100 | 30 days supply | Qty: 30 | Fill #9

## 2019-08-24 MED FILL — CARVEDILOL 12.5 MG TABLET: 12.5 | 30 days supply | Qty: 60 | Fill #9

## 2019-08-25 LAB — CBC
HCT: 38.5 % (ref 35.0–45.0)
Hemoglobin: 12.8 g/dL (ref 11.7–15.5)
MCH: 28.4 pg (ref 27.0–33.0)
MCHC: 33.2 g/dL (ref 32.0–36.0)
MCV: 85.4 fL (ref 80.0–100.0)
MPV: 9.9 fL (ref 7.5–12.5)
Platelets: 240 10*3/uL (ref 140–400)
RBC: 4.51 10*6/uL (ref 3.80–5.10)
RDW: 13.2 % (ref 11.0–15.0)
WBC: 4.4 10*3/uL (ref 3.8–10.8)

## 2019-08-25 LAB — LIVER FIBROSIS, FIBROTEST-ACTITEST
ALT: 88 U/L — ABNORMAL HIGH (ref 6–29)
Alpha-2-Macroglobulin: 383 mg/dL — ABNORMAL HIGH (ref 106–279)
Apolipoprotein A1: 206 mg/dL — ABNORMAL HIGH (ref 101–198)
Bilirubin: 0.5 mg/dL (ref 0.2–1.2)
Fibrosis Score: 0.48
GGT: 392 U/L — ABNORMAL HIGH (ref 3–55)
Haptoglobin: 147 mg/dL (ref 43–212)
Necroinflammat ACT Score: 0.58
Reference ID: 3367055

## 2019-08-25 LAB — BASIC METABOLIC PANEL
BUN: 7 mg/dL (ref 7–25)
CO2: 28 mmol/L (ref 20–32)
Calcium: 9.8 mg/dL (ref 8.6–10.2)
Chloride: 105 mmol/L (ref 98–110)
Creat: 0.59 mg/dL (ref 0.50–1.10)
Glucose, Bld: 112 mg/dL — ABNORMAL HIGH (ref 65–99)
Potassium: 4.3 mmol/L (ref 3.5–5.3)
Sodium: 140 mmol/L (ref 135–146)

## 2019-08-25 LAB — HEPATIC FUNCTION PANEL
AG Ratio: 1.2 (calc) (ref 1.0–2.5)
ALT: 88 U/L — ABNORMAL HIGH (ref 6–29)
AST: 111 U/L — ABNORMAL HIGH (ref 10–30)
Albumin: 4.2 g/dL (ref 3.6–5.1)
Alkaline phosphatase (APISO): 68 U/L (ref 31–125)
Bilirubin, Direct: 0.2 mg/dL (ref 0.0–0.2)
Globulin: 3.4 g/dL (calc) (ref 1.9–3.7)
Indirect Bilirubin: 0.4 mg/dL (calc) (ref 0.2–1.2)
Total Bilirubin: 0.6 mg/dL (ref 0.2–1.2)
Total Protein: 7.6 g/dL (ref 6.1–8.1)

## 2019-08-25 LAB — PROTIME-INR
INR: 1
Prothrombin Time: 10.9 s (ref 9.0–11.5)

## 2019-08-25 LAB — HEPATITIS C RNA QUANTITATIVE
HCV Quantitative Log: 6.68 Log IU/mL — ABNORMAL HIGH
HCV RNA, PCR, QN: 4810000 IU/mL — ABNORMAL HIGH

## 2019-08-25 LAB — HIV ANTIBODY (ROUTINE TESTING W REFLEX): HIV 1&2 Ab, 4th Generation: NONREACTIVE

## 2019-08-25 LAB — HEPATITIS C GENOTYPE

## 2019-08-25 LAB — HEPATITIS B SURFACE ANTIGEN: Hepatitis B Surface Ag: NONREACTIVE

## 2019-08-25 LAB — HEPATITIS B SURFACE ANTIBODY,QUALITATIVE: Hep B S Ab: BORDERLINE — AB

## 2019-08-30 ENCOUNTER — Encounter: Payer: Self-pay | Admitting: *Deleted

## 2019-09-21 MED FILL — AMLODIPINE BESYLATE 10 MG T: 10 | 30 days supply | Qty: 30 | Fill #10

## 2019-09-21 MED FILL — CARVEDILOL 12.5 MG TABLET: 12.5 | 30 days supply | Qty: 60 | Fill #10

## 2019-09-21 MED FILL — LOSARTAN POTASSIUM 100 MG T: 100 | 30 days supply | Qty: 30 | Fill #10

## 2019-09-28 ENCOUNTER — Other Ambulatory Visit: Payer: Self-pay | Admitting: Infectious Diseases

## 2019-09-28 MED ORDER — SOFOSBUVIR-VELPATASVIR 400-100 MG PO TABS: 1.0000 | ORAL_TABLET | Freq: Every day | ORAL | 2 refills | Status: DC

## 2019-10-05 ENCOUNTER — Telehealth: Payer: Self-pay | Admitting: Pharmacy Technician

## 2019-10-05 NOTE — Telephone Encounter (Signed)
[facility] Patient Advocate Encounter  Support Path application has been faxed to [facility] for processing and script sent to [place] to get the patient's Epclusa approved at no charge. I spoke to the patient to give her an update and her address remains the same so we will have it sent to the clinic. We will call her once the medication arrives to coordinate pick up.   Bartholomew Crews, CPhT Specialty Pharmacy Patient [facility] for Infectious Disease Phone: [phone] Fax: [phone] 10/05/2019 3:44 PM

## 2019-10-06 NOTE — Telephone Encounter (Signed)
[facility] Patient Advocate Encounter   Patient has been approved for [place] Patient Assistance Program for Epclusa from 10/06/2019 to 12/29/2019. This assistance will make the patient's copay $0.  I have spoken with the patient and they will have their medication shipped to the clinic due to current living situation.  [facility] will process and call us to set up first shipment within a couple days.   Patient knows to call the office with questions or concerns.  Bartholomew Crews, CPhT Specialty Pharmacy Patient [facility] for Infectious Disease Phone: [phone] Fax: [phone] 10/06/2019 4:25 PM

## 2019-10-07 NOTE — Telephone Encounter (Signed)
[facility] Patient Advocate Encounter  Eveline Keto will ship the medication to the clinic estimated arrival 10/11/2019.

## 2019-10-08 NOTE — Telephone Encounter (Signed)
[facility] Patient Advocate Encounter  Patient's medication has arrived to the clinic and placed in the pharmacy cabinet. Patient is aware and plans to pick up Monday 10/11/2019. She will need flowsheet and appointment and counseling at that time.

## 2019-10-11 ENCOUNTER — Encounter: Payer: Self-pay | Admitting: Pharmacy Technician

## 2019-10-11 ENCOUNTER — Other Ambulatory Visit: Payer: Self-pay

## 2019-10-11 ENCOUNTER — Ambulatory Visit (INDEPENDENT_AMBULATORY_CARE_PROVIDER_SITE_OTHER): Payer: Self-pay | Admitting: Pharmacist

## 2019-10-11 DIAGNOSIS — B182 Chronic viral hepatitis C: Secondary | ICD-10-CM

## 2019-10-11 NOTE — Progress Notes (Signed)
HPI: [name] L Friesen is a 44 y.o. female who presents to the [facility] clinic for Hepatitis C follow-up.  Medication: Raeanne Gathers  Start Date: 10/12/2019  Hepatitis C Genotype: 1b  Fibrosis Score: F1-F2  Hepatitis C RNA: 4,810,000  Patient Active Problem List   Diagnosis Date Noted  . Left breast mass 07/22/2019  . Cervical cancer screening 09/30/2017  . Alcohol use 08/01/2017  . Heart failure with reduced ejection fraction ([place]) 08/01/2017  . Hepatitis C 11/02/2007  . Tobacco use disorder 06/18/2007  . Essential hypertension 04/27/2007    Patient's Medications  New Prescriptions   No medications on file  Previous Medications   AMLODIPINE (NORVASC) 10 MG TABLET    Take 1 tablet (10 mg total) by mouth daily. IM PROGRAM   CARVEDILOL (COREG) 12.5 MG TABLET    Take 1 tablet (12.5 mg total) by mouth 2 (two) times daily with a meal.   LOSARTAN (COZAAR) 100 MG TABLET    Take 1 tablet (100 mg total) by mouth daily.   SOFOSBUVIR-VELPATASVIR ([place]CL[place]) 400-100 MG TABS    Take 1 tablet by mouth daily.  Modified Medications   No medications on file  Discontinued Medications   No medications on file    Allergies: No Known Allergies  Past Medical History: Past Medical History:  Diagnosis Date  . Hepatitis C   . HFrEF (heart failure with reduced ejection fraction) ([place])   . Hypertension     Social History: Social History   Socioeconomic History  . Marital status: Single    Spouse name: Not on file  . Number of children: Not on file  . Years of education: Not on file  . Highest education level: Not on file  Occupational History  . Not on file  Tobacco Use  . Smoking status: Current Some Day Smoker    Packs/day: 0.25    Types: Cigarettes  . Smokeless tobacco: Never Used  . Tobacco comment: 6-7 per day ./  has Vaped   Substance and Sexual Activity  . Alcohol use: Yes    Alcohol/week: 28.0 standard drinks    Types: 28 Standard drinks or equivalent per week  . Drug  use: Yes    Frequency: 0.5 times per week    Types: Marijuana  . Sexual activity: Not on file  Other Topics Concern  . Not on file  Social History Narrative  . Not on file   Social Determinants of Health   Financial Resource Strain:   . Difficulty of Paying Living Expenses:   Food Insecurity:   . Worried About Charity fundraiser in the Last Year:   . Arboriculturist in the Last Year:   Transportation Needs:   . Film/video editor (Medical):   Marland Kitchen Lack of Transportation (Non-Medical):   Physical Activity:   . Days of Exercise per Week:   . Minutes of Exercise per Session:   Stress:   . Feeling of Stress :   Social Connections:   . Frequency of Communication with Friends and Family:   . Frequency of Social Gatherings with Friends and Family:   . Attends Religious Services:   . Active Member of Clubs or Organizations:   . Attends Archivist Meetings:   Marland Kitchen Marital Status:     Labs: Hepatitis C Lab Results  Component Value Date   HCVGENOTYPE 1b 08/20/2019   HCVRNAPCRQN 4,810,000 (H) 08/20/2019   FIBROSTAGE F1-F2 08/20/2019   Hepatitis B Lab Results  Component  Value Date   HEPBSAB BORDERLINE (A) 08/20/2019   HEPBSAG NON-REACTIVE 08/20/2019   Hepatitis A No results found for: HAV HIV Lab Results  Component Value Date   HIV NON-REACTIVE 08/20/2019   HIV Non Reactive 07/13/2018   HIV Non Reactive 07/28/2017   HIV NON REAC 10/19/2007   Lab Results  Component Value Date   CREATININE 0.59 08/20/2019   CREATININE 0.59 07/21/2019   CREATININE 0.63 11/18/2018   CREATININE 0.54 (L) 07/13/2018   CREATININE 0.80 08/28/2017   Lab Results  Component Value Date   AST 111 (H) 08/20/2019   AST 66 (H) 11/18/2018   AST 74 (H) 07/13/2018   ALT 88 (H) 08/20/2019   ALT 88 (H) 08/20/2019   ALT 61 (H) 11/18/2018   INR 1.0 08/20/2019   INR 0.89 06/23/2013   INR 0.89 06/21/2013    Assessment: [name] is here at clinic this morning to pick up Epclusa for her  hepatitis C infection. She is in good spirits today and is excited to get this treated. We discussed she can start treatment today if she would like, and she prefers to defer for a day because today is the first day of her menstrual cycle and she doesn't feel that great. I talked to her about Raeanne Gathers and that it is a one pill once a day medication that should be taken with food. Food will help with the absorption and also with any nausea she may experience. Some of the other common side effects of this medication include headache and fatigue. Both of which tend to resolve in a day or so as the medication is continued. However, if she should feel the need to take something for her headache she should take ibuprofen rather than tylenol. We also discussed how important adherence is with this regimen, and that 100% adherence gives her the best chance of cure. I recommended taking this medication at the same time every day to help with adherence. I also reviewed her medication profile and she does not have any medication interactions. She does not take any over the counter acid suppressants and does not plan to start. We will wait to get labs until her follow up visit in a month.    Plan: - Start Epclusa 10/12/2019 - Follow up 11/10/19 with Rubin Payor   Nicoletta Dress, PharmD PGY2 Infectious Disease Pharmacy Resident  [facility] for Infectious Disease 10/11/2019, 11:11 AM

## 2019-10-13 ENCOUNTER — Telehealth: Payer: Self-pay

## 2019-10-13 ENCOUNTER — Encounter: Payer: Medicaid Other | Admitting: Internal Medicine

## 2019-10-13 NOTE — Telephone Encounter (Signed)
Missed appointment- patient said she was not going to make it today and will call us back to reschedule [place], [place] C6/16/20213:51 PM

## 2019-10-13 NOTE — Progress Notes (Deleted)
   CC: HTN  HPI:Ms.[name] L Mette is a 44 y.o. female who presents for evaluation of HTN. Please see individual problem based A/P for details.  Hypertension: Patient's BP today is ***/*** with a goal of <140/80. The patient endorses adherence to *** medication regimen. *** denied, chest pain, headache, visual changes, lightheadedness, weakness, dizziness on standing, swelling in the feet or ankles.   Plan: Continue Amlodipine 10mg  daily Continue Losartan 100mg  daily Continue Carvedilol 12.5mg  BID  Hepatitis C: *** Following with [facility], started treatment ***.  Plan: Continue to follow with [facility] I greatly appreciate their assistance  ***: ***  Plan:  ***  ***: ***  Plan:  ***  Health Maintenance History: ***  Health Maintenance Significant procedure history: ***  Immunization History: ***  Depression, PHQ-9: Based on the patients    Office Visit from 07/21/2019 in [facility]  PHQ-9 Total Score 0     score we have ***.  Past Medical History:  Diagnosis Date  . Hepatitis C   . HFrEF (heart failure with reduced ejection fraction) ([place])   . Hypertension    Review of Systems:  *** ROS negative except as per HPI.  Physical Exam: There were no vitals filed for this visit. There were no vitals filed for this visit. *** Assessment & Plan:   See Encounters Tab for problem based charting.  Patient {GC/GE:[id number]::"discussed with","seen with"} Dr. Minna Merritts. Hoffman","Klima","Mullen","Narendra","Raines","Vincent"}

## 2019-10-20 MED FILL — LOSARTAN POTASSIUM 100 MG T: 100 | 30 days supply | Qty: 30 | Fill #11

## 2019-10-20 MED FILL — CARVEDILOL 12.5 MG TABLET: 12.5 | 30 days supply | Qty: 60 | Fill #11

## 2019-10-20 MED FILL — AMLODIPINE BESYLATE 10 MG T: 10 | 30 days supply | Qty: 30 | Fill #11

## 2019-10-20 NOTE — Addendum Note (Signed)
Addended by: Darletta Moll on: 10/20/2019 10:47 AM   Modules accepted: Level of Service

## 2019-10-26 ENCOUNTER — Telehealth: Payer: Self-pay | Admitting: Pharmacy Technician

## 2019-10-26 NOTE — Telephone Encounter (Signed)
[facility] Patient Advocate Encounter  Patient's [name] will be shipped to [facility] from [place] Thursday October 28, 2019.  We will reach out to her once it arrives.  Venida Jarvis. Nadara Mustard Luis M. [place] Patient [facility] for Infectious Disease Phone: [phone] Fax:  [phone]

## 2019-10-27 NOTE — Telephone Encounter (Signed)
The medication is here and patient has been notified.  [name]. [name] Patient [facility] for Infectious Disease Phone: [phone] Fax:  [phone]

## 2019-11-10 ENCOUNTER — Other Ambulatory Visit: Payer: Self-pay | Admitting: Pharmacist

## 2019-11-10 ENCOUNTER — Other Ambulatory Visit: Payer: Medicaid Other

## 2019-11-10 DIAGNOSIS — B182 Chronic viral hepatitis C: Secondary | ICD-10-CM

## 2019-11-10 NOTE — Progress Notes (Signed)
Patient is feeling under the weather and wishes to just come in for labs and to pick up medication. Will call her when her results come back.

## 2019-11-16 ENCOUNTER — Other Ambulatory Visit: Payer: Self-pay

## 2019-11-16 ENCOUNTER — Ambulatory Visit (INDEPENDENT_AMBULATORY_CARE_PROVIDER_SITE_OTHER): Payer: Self-pay | Admitting: Pharmacist

## 2019-11-16 ENCOUNTER — Telehealth: Payer: Self-pay | Admitting: Pharmacy Technician

## 2019-11-16 DIAGNOSIS — B182 Chronic viral hepatitis C: Secondary | ICD-10-CM

## 2019-11-16 NOTE — Progress Notes (Signed)
HPI: [name] is a 44 y.o. female who presents to the [facility] for Hepatitis C follow-up.  Medication: Epclusa x 12 weeks  Start Date: 10/12/19  Hepatitis C Genotype: 1b  Fibrosis Score: F1/F2  Hepatitis C RNA: 4.8 million on 08/20/19  Patient Active Problem List   Diagnosis Date Noted  . Left breast mass 07/22/2019  . Cervical cancer screening 09/30/2017  . Alcohol use 08/01/2017  . Heart failure with reduced ejection fraction ([place]) 08/01/2017  . Hepatitis C 11/02/2007  . Tobacco use disorder 06/18/2007  . Essential hypertension 04/27/2007    Patient's Medications  New Prescriptions   No medications on file  Previous Medications   AMLODIPINE (NORVASC) 10 MG TABLET    Take 1 tablet (10 mg total) by mouth daily. IM PROGRAM   CARVEDILOL (COREG) 12.5 MG TABLET    Take 1 tablet (12.5 mg total) by mouth 2 (two) times daily with a meal.   LOSARTAN (COZAAR) 100 MG TABLET    Take 1 tablet (100 mg total) by mouth daily.   SOFOSBUVIR-VELPATASVIR (EPCLUSA) 400-100 MG TABS    Take 1 tablet by mouth daily.  Modified Medications   No medications on file  Discontinued Medications   No medications on file    Allergies: No Known Allergies  Past Medical History: Past Medical History:  Diagnosis Date  . Hepatitis C   . HFrEF (heart failure with reduced ejection fraction) ([place])   . Hypertension     Social History: Social History   Socioeconomic History  . Marital status: Single    Spouse name: Not on file  . Number of children: Not on file  . Years of education: Not on file  . Highest education level: Not on file  Occupational History  . Not on file  Tobacco Use  . Smoking status: Current Some Day Smoker    Packs/day: 0.25    Types: Cigarettes  . Smokeless tobacco: Never Used  . Tobacco comment: 6-7 per day ./  has Vaped   Substance and Sexual Activity  . Alcohol use: Yes    Alcohol/week: 28.0 standard drinks    Types: 28 Standard drinks or  equivalent per week  . Drug use: Yes    Frequency: 0.5 times per week    Types: Marijuana  . Sexual activity: Not on file  Other Topics Concern  . Not on file  Social History Narrative  . Not on file   Social Determinants of Health   Financial Resource Strain:   . Difficulty of Paying Living Expenses:   Food Insecurity:   . Worried About Charity fundraiser in the Last Year:   . Arboriculturist in the Last Year:   Transportation Needs:   . Film/video editor (Medical):   Marland Kitchen Lack of Transportation (Non-Medical):   Physical Activity:   . Days of Exercise per Week:   . Minutes of Exercise per Session:   Stress:   . Feeling of Stress :   Social Connections:   . Frequency of Communication with Friends and Family:   . Frequency of Social Gatherings with Friends and Family:   . Attends Religious Services:   . Active Member of Clubs or Organizations:   . Attends Archivist Meetings:   Marland Kitchen Marital Status:     Labs: Hepatitis C Lab Results  Component Value Date   HCVGENOTYPE 1b 08/20/2019   HCVRNAPCRQN 4,810,000 (H) 08/20/2019   FIBROSTAGE F1-F2 08/20/2019  Hepatitis B Lab Results  Component Value Date   HEPBSAB BORDERLINE (A) 08/20/2019   HEPBSAG NON-REACTIVE 08/20/2019   Hepatitis A No results found for: HAV HIV Lab Results  Component Value Date   HIV NON-REACTIVE 08/20/2019   HIV Non Reactive 07/13/2018   HIV Non Reactive 07/28/2017   HIV NON REAC 10/19/2007   Lab Results  Component Value Date   CREATININE 0.65 11/16/2019   CREATININE 0.59 08/20/2019   CREATININE 0.59 07/21/2019   CREATININE 0.63 11/18/2018   CREATININE 0.54 (L) 07/13/2018   Lab Results  Component Value Date   AST 19 11/16/2019   AST 111 (H) 08/20/2019   AST 66 (H) 11/18/2018   ALT 18 11/16/2019   ALT 88 (H) 08/20/2019   ALT 88 (H) 08/20/2019   INR 1.0 08/20/2019   INR 0.89 06/23/2013   INR 0.89 06/21/2013    Assessment: [place] is here today for her 4 week  Hepatitis C follow up appointment.  She started Epclusa x 12 weeks on 6/15. She was due for a follow up appointment with me on 7/14 but called and stated she wasn't feeling well.  I had the front desk staff tell her that she could swing by and pick up her 2nd month of Epclusa that was here (she took her last pill of her 1st month that morning) and then she could reschedule her appointment with me. She said she would show up but she ended up no showing that appointment. Because of this, she has been out of medication for 1 week. I told her that was problematic because the more doses you miss, the chances of being cured go down. She picked up her 2nd month today, and I instructed her to make sure to not miss any doses going forward and to come pick up her 3rd and final month from [place] when she has 5-6 tablets left.  Will check labs today and see her back at the end of treatment.   Plan: - Continue Epclusa for 12 weeks - Try to not miss anymore doses - Hep C viral load + CMET today - F/u with me again 9/23 at 315pm  [name], PharmD, BCIDP, AAHIVP, CPP Clinical Pharmacist Practitioner [place] for Infectious Disease 11/17/2019, 12:24 PM

## 2019-11-16 NOTE — Telephone Encounter (Addendum)
[facility] Patient Advocate Encounter  Patient's [name] (final month) has arrived to [facility] from [employer].   Will make patient aware.   [name]. [name] Patient [facility] for Infectious Disease Phone: [phone] Fax:  [phone]

## 2019-11-17 ENCOUNTER — Ambulatory Visit: Payer: Medicaid Other | Admitting: Pharmacist

## 2019-11-19 ENCOUNTER — Telehealth: Payer: Self-pay | Admitting: *Deleted

## 2019-11-19 ENCOUNTER — Encounter: Payer: Self-pay | Admitting: Pharmacist

## 2019-11-19 LAB — COMPREHENSIVE METABOLIC PANEL
AG Ratio: 1.3 (calc) (ref 1.0–2.5)
ALT: 18 U/L (ref 6–29)
AST: 19 U/L (ref 10–30)
Albumin: 4.3 g/dL (ref 3.6–5.1)
Alkaline phosphatase (APISO): 41 U/L (ref 31–125)
BUN: 8 mg/dL (ref 7–25)
CO2: 25 mmol/L (ref 20–32)
Calcium: 9.1 mg/dL (ref 8.6–10.2)
Chloride: 104 mmol/L (ref 98–110)
Creat: 0.65 mg/dL (ref 0.50–1.10)
Globulin: 3.3 g/dL (calc) (ref 1.9–3.7)
Glucose, Bld: 83 mg/dL (ref 65–99)
Potassium: 3.9 mmol/L (ref 3.5–5.3)
Sodium: 138 mmol/L (ref 135–146)
Total Bilirubin: 0.6 mg/dL (ref 0.2–1.2)
Total Protein: 7.6 g/dL (ref 6.1–8.1)

## 2019-11-19 LAB — HEPATITIS C RNA QUANTITATIVE
HCV Quantitative Log: <1.18 Log IU/mL
HCV RNA, PCR, QN: <15 IU/mL

## 2019-11-19 NOTE — Telephone Encounter (Signed)
Patient called with a question about Epclusa. She says her normal cycle is 7 days, but states her cycle is "starting over, with cramps heavy bleeding here on day 8." She is on her 2nd bottle of Epclusa. She is wondering if this change in her cycle is because of Sherryl Barters, Lanice Schwab, RN

## 2019-11-23 ENCOUNTER — Other Ambulatory Visit: Payer: Self-pay | Admitting: Student

## 2019-11-23 ENCOUNTER — Other Ambulatory Visit: Payer: Self-pay | Admitting: Internal Medicine

## 2019-11-23 DIAGNOSIS — I10 Essential (primary) hypertension: Secondary | ICD-10-CM

## 2019-11-23 DIAGNOSIS — I5022 Chronic systolic (congestive) heart failure: Secondary | ICD-10-CM

## 2019-11-23 MED ORDER — LOSARTAN POTASSIUM 100 MG PO TABS: 100.0000 mg | ORAL_TABLET | Freq: Every day | ORAL | 3 refills | Status: DC

## 2019-11-23 MED ORDER — CARVEDILOL 12.5 MG PO TABS: 12.5000 mg | ORAL_TABLET | Freq: Two times a day (BID) | ORAL | 3 refills | Status: DC

## 2019-11-23 MED ORDER — AMLODIPINE BESYLATE 10 MG PO TABS: 10.0000 mg | ORAL_TABLET | Freq: Every day | ORAL | 3 refills | Status: DC

## 2019-11-23 MED FILL — AMLODIPINE BESYLATE 10 MG T: 10 | 30 days supply | Qty: 30 | Fill #0

## 2019-11-23 MED FILL — LOSARTAN POTASSIUM 100 MG T: 100 | 30 days supply | Qty: 30 | Fill #0

## 2019-11-23 MED FILL — CARVEDILOL 12.5 MG TABLET: 12.5 | 30 days supply | Qty: 60 | Fill #0

## 2019-11-23 NOTE — Telephone Encounter (Signed)
Next appt scheduled 8/6 with Dr Bridgett Larsson.

## 2019-11-23 NOTE — Telephone Encounter (Signed)
Refill Request- pt states she is out of the BP meds.  amLODipine (NORVASC) 10 MG tablet(Expired) carvedilol (COREG) 12.5 MG tablet losartan (COZAAR) 100 MG tablet   [facility] - [place], [place] - [address].

## 2019-12-03 ENCOUNTER — Ambulatory Visit: Payer: Self-pay | Admitting: Student

## 2019-12-03 ENCOUNTER — Other Ambulatory Visit: Payer: Self-pay | Admitting: Student

## 2019-12-03 ENCOUNTER — Other Ambulatory Visit: Payer: Self-pay

## 2019-12-03 ENCOUNTER — Encounter: Payer: Self-pay | Admitting: Student

## 2019-12-03 VITALS — BP 171/99 | HR 79 | Temp 98.3°F | Wt 171.3 lb

## 2019-12-03 DIAGNOSIS — Z124 Encounter for screening for malignant neoplasm of cervix: Secondary | ICD-10-CM

## 2019-12-03 DIAGNOSIS — L02412 Cutaneous abscess of left axilla: Secondary | ICD-10-CM

## 2019-12-03 DIAGNOSIS — I5020 Unspecified systolic (congestive) heart failure: Secondary | ICD-10-CM

## 2019-12-03 DIAGNOSIS — N6320 Unspecified lump in the left breast, unspecified quadrant: Secondary | ICD-10-CM

## 2019-12-03 DIAGNOSIS — I10 Essential (primary) hypertension: Secondary | ICD-10-CM

## 2019-12-03 DIAGNOSIS — Z0000 Encounter for general adult medical examination without abnormal findings: Secondary | ICD-10-CM | POA: Insufficient documentation

## 2019-12-03 HISTORY — DX: Cutaneous abscess of left axilla: L02.412

## 2019-12-03 MED ORDER — LOSARTAN POTASSIUM-HCTZ 100-25 MG PO TABS: 1.0000 | ORAL_TABLET | Freq: Every day | ORAL | 1 refills | Status: DC

## 2019-12-03 MED FILL — LOSARTAN-HCTZ 100-25 MG TAB: 100-25 | 30 days supply | Qty: 30 | Fill #0

## 2019-12-03 NOTE — Assessment & Plan Note (Signed)
Patient was unable to make it to previously scheduled mammogram due to social difficulties. The mass has since resolved. It was previously tender and worse with her menstrual periods. Referral still in place.  -instructed patient to call for mammogram

## 2019-12-03 NOTE — Assessment & Plan Note (Signed)
Mammogram referral previously placed for painful mass that has since resolved. Would be appropriate for screening mammogram at this time.  -instructed patient to schedule appointment for mammogram when able to make it

## 2019-12-03 NOTE — Assessment & Plan Note (Addendum)
-  abscess incised and 3-5cc of pus drained -no complications -see procedure note in today's clinic note for further details

## 2019-12-03 NOTE — Assessment & Plan Note (Signed)
Denies recent exacerbation in SOB, no leg swelling. Previously saw Dr. Doylene Canard, but has not had insurance in some time. States that she will be able to get insurance with the next calendar year through her work.  -continue Carvedilol 12.5mg  BID -switched Losartan to Hyzaar for BP control

## 2019-12-03 NOTE — Assessment & Plan Note (Signed)
BP of 171/99 today. Recent blood pressures 233K-122E systolic. Reports that she ran out of her amlodipine for 3 days but had this filled 4 days ago. Reports otherwise is compliant with her prescriptions. Denies headaches, SOB, n/d.  -switch Losartan to Hyzaar (Losartan-hctz) 100-25mg  daily -continue carvedilol 25mg  BID -continue amlodipine 10mg  -considered BiDil since she has HFrEF as well, but cost would be difficult for her

## 2019-12-03 NOTE — Progress Notes (Signed)
   CC: follow up, abscess  HPI:  [name] is a 44 y.o. female with history as below presenting for follow up on chronic HTN and HFrEF, also with acute complaint of abscess of L axiilla. Please refer to problem based charting for further details of assessment and plan of current problem and chronic medical conditions.  Past Medical History:  Diagnosis Date  . Hepatitis C   . HFrEF (heart failure with reduced ejection fraction) ([place])   . Hypertension    Review of Systems:   Review of Systems  Constitutional: Negative for chills, fever and weight loss.  HENT: Negative for congestion and sore throat.   Respiratory: Negative for cough and shortness of breath.   Cardiovascular: Negative for chest pain, palpitations and leg swelling.  Skin:       Painful abscess to L axiilla  Neurological: Negative for headaches.     Physical Exam: Vitals:   12/03/19 1455  BP: (!) 171/99  Pulse: 79  Temp: 98.3 F (36.8 C)  TempSrc: Oral  SpO2: 100%  Weight: 171 lb 4.8 oz (77.7 kg)   Constitutional: no acute distress Head: atraumatic ENT: external ears normal Cardiovascular: regular rate and rhythm, normal heart sounds Pulmonary: effort normal, normal breath sounds bilaterally Abdominal: flat, nontender, no rebound tenderness, bowel sounds normal Skin: warm and dry, L axilla with abscess that has a central 1cm area of fluctuance with small amount of drainage, surrounded by 4c3cm area of induration, nontender, no tracking noted, small amount of pus was expressed Neurological: alert, no focal deficit Psychiatric: normal mood and affect  Assessment & Plan:   Abscess drained, see procedure note below.  See Encounters Tab for problem based charting.  Patient seen with Dr. Dareen Piano    Incision and Drainage Procedure Note  Pre-operative Diagnosis: abscess to L axilla  Post-operative Diagnosis: same  Indications: infection  Anesthesia: 1% plain lidocaine  Procedure Details    The procedure, risks and complications have been discussed in detail (including infection and bleeding) with the patient, and the patient has signed consent to the procedure.  The skin was cleaned with betadine in the usual fashion. After adequate local anesthesia with lidocaine, I&D with a #15 blade was performed on the left axilla. Purulent material was drained. Forceps were inserted into the incision to break up loculations. Total of 3-5cc of pus was drained. Wound was dressed.   EBL: 2 cc's  Condition: Tolerated procedure well   Complications: none.

## 2019-12-03 NOTE — Assessment & Plan Note (Signed)
Will need to plan for this at her next visit.

## 2019-12-03 NOTE — Patient Instructions (Signed)
Thank you for allowing [place] to be a part of your care today, it was pleasure seeing you. We discussed your hypertension, heart failure, and abscess.  We drained your abscess. It will continue to drain over the next few days. You may apply a warm compress. If you have worsening pain or swelling, please call or visit [place], or an ED.   I have made these changes to your medications: Stopped Losartan Started Hyzaar 100-25 mg daily  Please follow up in 2 weeks   Thank you, and please call the Internal Medicine Clinic at [phone] if you have any questions.  Best, Dr. Bridgett Larsson

## 2019-12-10 NOTE — Progress Notes (Signed)
Internal Medicine Clinic Attending  I saw and evaluated the patient.  I personally confirmed the key portions of the history and exam documented by Dr. Bridgett Larsson and I reviewed pertinent patient test results.  The assessment, diagnosis, and plan were formulated together and I agree with the documentation in the residents note.   I supervised the I and D of the left axillary abscess done by Dr. Bridgett Larsson. Please refer to the procedure note for details. Infomred consent was obtained prior to the procedure and we drained the abscess under sterile conditions. We drained approximately 3-5 cc of pus with minimal blood loss.

## 2019-12-14 NOTE — Telephone Encounter (Signed)
Patient picked up the final month of Epclusa today.  [name]. [name] Patient [facility] for Infectious Disease Phone: [phone] Fax:  [phone]

## 2019-12-27 ENCOUNTER — Telehealth: Payer: Self-pay

## 2019-12-27 ENCOUNTER — Ambulatory Visit (INDEPENDENT_AMBULATORY_CARE_PROVIDER_SITE_OTHER): Payer: Self-pay | Admitting: Internal Medicine

## 2019-12-27 ENCOUNTER — Other Ambulatory Visit: Payer: Self-pay

## 2019-12-27 ENCOUNTER — Encounter: Payer: Self-pay | Admitting: Internal Medicine

## 2019-12-27 VITALS — BP 175/102 | HR 69 | Temp 99.0°F | Ht 62.0 in | Wt 171.1 lb

## 2019-12-27 DIAGNOSIS — R55 Syncope and collapse: Secondary | ICD-10-CM

## 2019-12-27 DIAGNOSIS — R42 Dizziness and giddiness: Secondary | ICD-10-CM

## 2019-12-27 NOTE — Telephone Encounter (Signed)
Sounds likely to be dehydration likely related to the heat outside. Agree that it is unlikely Epclusa and to follow up with PCP.

## 2019-12-27 NOTE — Telephone Encounter (Signed)
Patient states two day ago she started experienced dizzy spells and feeling light headed and faint ( has not actually fainted) Denies headaches, nausea, diarrhea. Patient currently being treated at [facility] for Hep C and is on her third month of Epclusa.  Advised patient to contact her PCP to try to get in to be seen today due to patient having hx. Heart failure. Patient verbalized understanding. Will route to PharmD and Marya Amsler. Eugenia Mcalpine

## 2019-12-27 NOTE — Progress Notes (Signed)
   CC: episodes of feeling dizzy  HPI:Ms.[name] L Vari is a 44 y.o. female who presents for evaluation of episodes of feeling dizzy. Please see individual problem based A/P for details.   Past Medical History:  Diagnosis Date  . Abscess of left axilla 12/03/2019   Presents with 4d of painful abscess under L axilla after shaving. No fever or chills. It has not drained. On exam, there is a small area of draining pus surrounded by 1cm area of fluctuance and erythema, surrounded by a larger indurated area. Not significantly tender. No tracking noted. Small amount of pus expressed.  . Hepatitis C   . HFrEF (heart failure with reduced ejection fraction) ([place])   . Hypertension   . Left breast mass 07/22/2019   Review of Systems:   Review of Systems  Constitutional: Negative for chills and fever.  HENT: Negative for congestion and tinnitus.   Eyes: Negative for blurred vision and double vision.  Gastrointestinal: Negative for diarrhea and nausea.  Genitourinary: Negative for dysuria and urgency.     Physical Exam: Vitals:   12/27/19 1038  BP: (!) 175/102  Pulse: 69  Temp: 99 F (37.2 C)  TempSrc: Oral  SpO2: 97%  Weight: 171 lb 1.6 oz (77.6 kg)  Height: 5\' 2"  (1.575 m)     General: NAD, nl appearance HEENT: Normocephalic, atraumatic , Conjunctiva nl  Cardiovascular: Normal rate, regular rhythm.  No murmurs, rubs, or gallops Pulmonary : Equal breath sounds, No wheezes, rales, or rhonchi Abdominal: soft, nontender,  bowel sounds present   Assessment & Plan:   See Encounters Tab for problem based charting.  Patient discussed with Dr. Angelia Mould

## 2019-12-27 NOTE — Telephone Encounter (Signed)
No, those aren't side effects from [place]. Agree with going to PCP. Something else seems like it is going on that has nothing to do with her Hep C probably. Thank you!

## 2019-12-27 NOTE — Patient Instructions (Addendum)
Thank you for trusting me with your care. To recap, today we discussed the following:  1. Dizziness - CMP14 + Anion Gap - CBC with Diff  I will follow up with your results.

## 2019-12-28 ENCOUNTER — Encounter: Payer: Self-pay | Admitting: Internal Medicine

## 2019-12-28 DIAGNOSIS — R55 Syncope and collapse: Secondary | ICD-10-CM | POA: Insufficient documentation

## 2019-12-28 LAB — CMP14 + ANION GAP
ALT: 25 IU/L (ref 0–32)
AST: 24 IU/L (ref 0–40)
Albumin/Globulin Ratio: 1.5 (ref 1.2–2.2)
Albumin: 4.5 g/dL (ref 3.8–4.8)
Alkaline Phosphatase: 55 IU/L (ref 48–121)
Anion Gap: 13 mmol/L (ref 10.0–18.0)
BUN/Creatinine Ratio: 11 (ref 9–23)
BUN: 7 mg/dL (ref 6–24)
Bilirubin Total: 0.3 mg/dL (ref 0.0–1.2)
CO2: 25 mmol/L (ref 20–29)
Calcium: 9.6 mg/dL (ref 8.7–10.2)
Chloride: 101 mmol/L (ref 96–106)
Creatinine, Ser: 0.63 mg/dL (ref 0.57–1.00)
GFR calc Af Amer: 126 mL/min/{1.73_m2} (ref >59)
GFR calc non Af Amer: 109 mL/min/{1.73_m2} (ref >59)
Globulin, Total: 3.1 g/dL (ref 1.5–4.5)
Glucose: 91 mg/dL (ref 65–99)
Potassium: 4.3 mmol/L (ref 3.5–5.2)
Sodium: 139 mmol/L (ref 134–144)
Total Protein: 7.6 g/dL (ref 6.0–8.5)

## 2019-12-28 LAB — CBC WITH DIFFERENTIAL/PLATELET
Basophils Absolute: 0 10*3/uL (ref 0.0–0.2)
Basos: 0 %
EOS (ABSOLUTE): 0 10*3/uL (ref 0.0–0.4)
Eos: 0 %
Hematocrit: 38.6 % (ref 34.0–46.6)
Hemoglobin: 12.8 g/dL (ref 11.1–15.9)
Immature Grans (Abs): 0 10*3/uL (ref 0.0–0.1)
Immature Granulocytes: 0 %
Lymphocytes Absolute: 2.8 10*3/uL (ref 0.7–3.1)
Lymphs: 35 %
MCH: 27.9 pg (ref 26.6–33.0)
MCHC: 33.2 g/dL (ref 31.5–35.7)
MCV: 84 fL (ref 79–97)
Monocytes Absolute: 0.5 10*3/uL (ref 0.1–0.9)
Monocytes: 6 %
Neutrophils Absolute: 4.7 10*3/uL (ref 1.4–7.0)
Neutrophils: 59 %
Platelets: 212 10*3/uL (ref 150–450)
RBC: 4.58 x10E6/uL (ref 3.77–5.28)
RDW: 12.5 % (ref 11.7–15.4)
WBC: 8 10*3/uL (ref 3.4–10.8)

## 2019-12-28 NOTE — Assessment & Plan Note (Addendum)
Patient here for acute visit for two episodes of feeling dizzy. She was sitting one time and walking when it happen the other time.This has been a  stressful week. Patient's mother died and  reports she has been drinking alcohol more than normal to cope. She has had heavy period this last month and reports some previous anemia. 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 Cardiologist thought this could be alcohol induced and also patient had uncontrolled HTN.    BP elevated 175/102.  Patient had normal orthostatc vitals.  Assessment: Pre-syncope.  Patient labs were normal. Glucose normal, no reported seizure like activity , or neurological deficits to suggest a stroke ( also unlikely given age) Patient not orthostatic on vitals so less likely related to medications, autonomic dysfunction or dehydration. Discussed results with patient and she is feeling much better today (8/31). If patient has repeat of symptoms I believe it would warrant TTE to better evaluate heart function. She is working on Insurance underwriter.   Plan:  CBC, CMP - results mentioned above - recommended alcohol cessation - Good BP control , has follow up on 9/9 with her PCP - Consider TTE if patient has anymore symptoms (patient is uninsured and she is working on [employer], discuss progress at next visit )

## 2019-12-28 NOTE — Assessment & Plan Note (Signed)
Patient here for acute visit for two episodes of feeling dizzy. BP elevated, but

## 2019-12-30 MED FILL — CARVEDILOL 12.5 MG TABLET: 12.5 | 30 days supply | Qty: 60 | Fill #1

## 2019-12-30 MED FILL — AMLODIPINE BESYLATE 10 MG T: 10 | 30 days supply | Qty: 30 | Fill #1

## 2019-12-30 MED FILL — LOSARTAN-HCTZ 100-25 MG TAB: 100-25 | 30 days supply | Qty: 30 | Fill #1

## 2019-12-30 NOTE — Progress Notes (Signed)
Internal Medicine Clinic Attending  Case discussed with Dr. Steen  At the time of the visit.  We reviewed the resident's history and exam and pertinent patient test results.  I agree with the assessment, diagnosis, and plan of care documented in the resident's note.  

## 2020-01-06 ENCOUNTER — Encounter: Payer: Medicaid Other | Admitting: Student

## 2020-01-20 ENCOUNTER — Ambulatory Visit: Payer: Self-pay | Admitting: Pharmacist

## 2020-01-26 MED FILL — CARVEDILOL 12.5 MG TABLET: 12.5 | 30 days supply | Qty: 60 | Fill #2

## 2020-01-26 MED FILL — AMLODIPINE BESYLATE 10 MG T: 10 | 30 days supply | Qty: 30 | Fill #2

## 2020-01-26 MED FILL — LOSARTAN-HCTZ 100-25 MG TAB: 100-25 | 30 days supply | Qty: 30 | Fill #2

## 2020-02-29 ENCOUNTER — Encounter: Payer: Medicaid Other | Admitting: Internal Medicine

## 2020-02-29 MED FILL — LOSARTAN-HCTZ 100-25 MG TAB: 100-25 | 30 days supply | Qty: 30 | Fill #3

## 2020-02-29 MED FILL — AMLODIPINE BESYLATE 10 MG T: 10 | 30 days supply | Qty: 30 | Fill #3

## 2020-02-29 MED FILL — CARVEDILOL 12.5 MG TABLET: 12.5 | 30 days supply | Qty: 60 | Fill #3

## 2020-02-29 NOTE — Progress Notes (Deleted)
Established Patient Office Visit  Subjective:  Patient ID: [name], female    DOB: November 04, 1975  Age: 44 y.o. MRN: [id number]  CC: No chief complaint on file.   HPI [name] with HTN, HFrEF, Hep C, Hx of pre-syncopal episode, tobacco use and alcohol use, presents for BP check.  Medications: Amlodipine 10 mg, coreg 12.5 mg BID, Losartan-HCTZ 100-25 mg QD, Epclusa (Sofobuvir-Velpatasvir) 400-100 mg QD Past Medical History:  Diagnosis Date   Abscess of left axilla 12/03/2019   Presents with 4d of painful abscess under L axilla after shaving. No fever or chills. It has not drained. On exam, there is a small area of draining pus surrounded by 1cm area of fluctuance and erythema, surrounded by a larger indurated area. Not significantly tender. No tracking noted. Small amount of pus expressed.   Hepatitis C    HFrEF (heart failure with reduced ejection fraction) ([place])    Hypertension    Left breast mass 07/22/2019    Past Surgical History:  Procedure Laterality Date   LEFT AND RIGHT HEART CATHETERIZATION WITH CORONARY ANGIOGRAM N/A 06/23/2013   Procedure: LEFT AND RIGHT HEART CATHETERIZATION WITH CORONARY ANGIOGRAM;  Surgeon: Birdie Riddle, MD;  Location: [facility];  Service: Cardiovascular;  Laterality: N/A;   LIVER BIOPSY     LIVER BIOPSY      Family History  Problem Relation Age of Onset   Hypertension Mother    Hyperlipidemia Mother    Diabetes Mother    Hepatitis C Mother    Diabetes Maternal Aunt     Social History   Socioeconomic History   Marital status: Single    Spouse name: Not on file   Number of children: Not on file   Years of education: Not on file   Highest education level: Not on file  Occupational History   Not on file  Tobacco Use   Smoking status: Current Some Day Smoker    Packs/day: 0.25    Types: Cigarettes   Smokeless tobacco: Never Used   Tobacco comment: 6-7 per day ./  has Vaped   Substance and Sexual  Activity   Alcohol use: Yes    Alcohol/week: 28.0 standard drinks    Types: 28 Standard drinks or equivalent per week   Drug use: Yes    Frequency: 0.5 times per week    Types: Marijuana   Sexual activity: Not on file  Other Topics Concern   Not on file  Social History Narrative   Not on file   Social Determinants of Health   Financial Resource Strain:    Difficulty of Paying Living Expenses: Not on file  Food Insecurity:    Worried About [place] in the Last Year: Not on file   Ran Out of Food in the Last Year: Not on file  Transportation Needs:    Lack of Transportation (Medical): Not on file   Lack of Transportation (Non-Medical): Not on file  Physical Activity:    Days of Exercise per Week: Not on file   Minutes of Exercise per Session: Not on file  Stress:    Feeling of Stress : Not on file  Social Connections:    Frequency of Communication with Friends and Family: Not on file   Frequency of Social Gatherings with Friends and Family: Not on file   Attends Religious Services: Not on file   Active Member of Clubs or Organizations: Not on file   Attends Archivist  Meetings: Not on file   Marital Status: Not on file  Intimate Partner Violence:    Fear of Current or Ex-Partner: Not on file   Emotionally Abused: Not on file   Physically Abused: Not on file   Sexually Abused: Not on file    Outpatient Medications Prior to Visit  Medication Sig Dispense Refill   amLODipine (NORVASC) 10 MG tablet Take 1 tablet (10 mg total) by mouth daily. IM PROGRAM 90 tablet 3   carvedilol (COREG) 12.5 MG tablet Take 1 tablet (12.5 mg total) by mouth 2 (two) times daily with a meal. 180 tablet 3   losartan-hydrochlorothiazide (HYZAAR) 100-25 MG tablet Take 1 tablet by mouth daily. 90 tablet 1   Sofosbuvir-Velpatasvir (EPCLUSA) 400-100 MG TABS Take 1 tablet by mouth daily. 28 tablet 2   No facility-administered medications prior to visit.      No Known Allergies  ROS Review of Systems    Objective:    Physical Exam  There were no vitals taken for this visit. Wt Readings from Last 3 Encounters:  12/27/19 171 lb 1.6 oz (77.6 kg)  12/03/19 171 lb 4.8 oz (77.7 kg)  08/20/19 170 lb (77.1 kg)     Health Maintenance Due  Topic Date Due   COVID-19 Vaccine (1) Never done   PAP SMEAR-Modifier  10/19/2010   INFLUENZA VACCINE  Never done    There are no preventive care reminders to display for this patient.  Lab Results  Component Value Date   TSH 1.016 06/23/2013   Lab Results  Component Value Date   WBC 8.0 12/27/2019   HGB 12.8 12/27/2019   HCT 38.6 12/27/2019   MCV 84 12/27/2019   PLT 212 12/27/2019   Lab Results  Component Value Date   NA 139 12/27/2019   K 4.3 12/27/2019   CO2 25 12/27/2019   GLUCOSE 91 12/27/2019   BUN 7 12/27/2019   CREATININE 0.63 12/27/2019   BILITOT 0.3 12/27/2019   ALKPHOS 55 12/27/2019   AST 24 12/27/2019   ALT 25 12/27/2019   PROT 7.6 12/27/2019   ALBUMIN 4.5 12/27/2019   CALCIUM 9.6 12/27/2019   ANIONGAP 7 08/18/2017   Lab Results  Component Value Date   CHOL 148 10/20/2009   Lab Results  Component Value Date   HDL 64 10/20/2009   Lab Results  Component Value Date   LDLCALC 71 10/20/2009   Lab Results  Component Value Date   TRIG 67 10/20/2009   Lab Results  Component Value Date   CHOLHDL 2.3 Ratio 10/20/2009   No results found for: HGBA1C    Assessment & Plan:   Problem List Items Addressed This Visit    None      HTN: The patient's blood pressure during this visit was ***. The patient is currently taking ***Amlodipine 10 mg, coreg 12.5 mg BID, Losartan-HCTZ 100-25 mg QD.   His/her *** last blood pressure visits were elevated as below: BP Readings from Last 3 Encounters:  12/27/19 (!) 175/102  12/03/19 (!) 171/99  08/20/19 (!) 150/112    The patient does/does not *** report palpitations, dizziness, chest pain, sob ***. Last  BMPabout 2 months ago was unremarkable and with nl kidney function, nl Na and K: BMP Latest Ref Rng & Units 12/27/2019 11/16/2019 08/20/2019  Glucose 65 - 99 mg/dL 91 83 112(H)  BUN 6 - 24 mg/dL 7 8 7   Creatinine 0.57 - 1.00 mg/dL 0.63 0.65 0.59  BUN/Creat Ratio 9 - 23 11  NOT APPLICABLE NOT APPLICABLE  Sodium 282 - 144 mmol/L 139 138 140  Potassium 3.5 - 5.2 mmol/L 4.3 3.9 4.3  Chloride 96 - 106 mmol/L 101 104 105  CO2 20 - 29 mmol/L 25 25 28   Calcium 8.7 - 10.2 mg/dL 9.6 9.1 9.8   HTN:  Pt last seen in [facility] on 12/03/2019 when her BP was 150-160s. She had ran out of Amlodipine for few days. Last visit, HCTZ 25 mg QD added to her regimen.  She report sadherance. BiDil recommended last visit as the next step if BP remains uncontroled. Cost has been an issue though.  BP today is ***.  -Continue*** -  HFrEF: Echo 2019 with slight improvement of EF to 35-40%. (Initially diagnosed at 35 and thought 2/2 alcohol use). No cath in chart. He was initialy seen by Dr. Doylene Canard but no further cards eval due to lack of insurance (she states she will get insurance next calendar year)  Denies SOB, DOE, orthopnea. Volume status is ok. Wt is ***There were no vitals filed for this visit.  -Drinks alcohol *** -Continue coreg 12.5 mg BID and lisinopril 100 mg QD -BiDil*** -Cardiology referral ***  Health care maintenance: Reminded pt that screening MM has been ordered and needs to be scheduled by her. She is due for Pap smear. Would like to have it done ***  COVID-19 vaccin consult: Has received first dose of vaccine***  No orders of the defined types were placed in this encounter.   Follow-up: No follow-ups on file.    Dewayne Hatch, MD    Thank you for allowing [place] to provide your care today. Today we discussed ***  Your blood pressure,   I have ordered *** labs for you. I will call if any are abnormal.    Today we made *** changes to your medications.    Please follow-up in ***.     Should you have any questions or concerns please call the internal medicine clinic at [phone].

## 2020-03-27 MED FILL — LOSARTAN-HCTZ 100-25 MG TAB: 100-25 | 60 days supply | Qty: 60 | Fill #4

## 2020-03-27 MED FILL — CARVEDILOL 12.5 MG TABLET: 12.5 | 90 days supply | Qty: 180 | Fill #4

## 2020-03-27 MED FILL — AMLODIPINE BESYLATE 10 MG T: 10 | 90 days supply | Qty: 90 | Fill #4

## 2020-03-31 ENCOUNTER — Encounter: Payer: Medicaid Other | Admitting: Student

## 2020-04-19 ENCOUNTER — Other Ambulatory Visit: Payer: Self-pay | Admitting: Student

## 2020-04-19 ENCOUNTER — Other Ambulatory Visit: Payer: Self-pay

## 2020-04-19 ENCOUNTER — Ambulatory Visit (INDEPENDENT_AMBULATORY_CARE_PROVIDER_SITE_OTHER): Payer: 59 | Admitting: Student

## 2020-04-19 ENCOUNTER — Encounter: Payer: Self-pay | Admitting: Student

## 2020-04-19 VITALS — BP 149/101 | HR 62 | Wt 169.7 lb

## 2020-04-19 DIAGNOSIS — Z124 Encounter for screening for malignant neoplasm of cervix: Secondary | ICD-10-CM

## 2020-04-19 DIAGNOSIS — I110 Hypertensive heart disease with heart failure: Secondary | ICD-10-CM | POA: Diagnosis not present

## 2020-04-19 DIAGNOSIS — Z789 Other specified health status: Secondary | ICD-10-CM

## 2020-04-19 DIAGNOSIS — Z7289 Other problems related to lifestyle: Secondary | ICD-10-CM

## 2020-04-19 DIAGNOSIS — I10 Essential (primary) hypertension: Secondary | ICD-10-CM

## 2020-04-19 DIAGNOSIS — U071 COVID-19: Secondary | ICD-10-CM | POA: Diagnosis not present

## 2020-04-19 DIAGNOSIS — R55 Syncope and collapse: Secondary | ICD-10-CM | POA: Diagnosis not present

## 2020-04-19 DIAGNOSIS — Z0000 Encounter for general adult medical examination without abnormal findings: Secondary | ICD-10-CM

## 2020-04-19 DIAGNOSIS — B182 Chronic viral hepatitis C: Secondary | ICD-10-CM

## 2020-04-19 DIAGNOSIS — I5020 Unspecified systolic (congestive) heart failure: Secondary | ICD-10-CM

## 2020-04-19 DIAGNOSIS — F17200 Nicotine dependence, unspecified, uncomplicated: Secondary | ICD-10-CM

## 2020-04-19 DIAGNOSIS — R0989 Other specified symptoms and signs involving the circulatory and respiratory systems: Secondary | ICD-10-CM

## 2020-04-19 MED ORDER — SPIRONOLACTONE 25 MG PO TABS: 25.0000 mg | ORAL_TABLET | Freq: Every day | ORAL | 11 refills | Status: DC

## 2020-04-19 MED FILL — SPIRONOLACTONE 25 MG TABS: 25 | 30 days supply | Qty: 30 | Fill #0

## 2020-04-19 NOTE — Assessment & Plan Note (Addendum)
At the end of our visit during physical exam, patient asks if chest congestion could be appreciated on exam. Pulmonary exam was normal. She reports 1-week of nasal congestion, dry cough, and feeling fatigued. She is not vaccinated against COVID. Has no known sick contacts.   Plan: - COVID test - Counseled on supportive care  04/21/20 ADDENDUM: - Patient's COVID PCR test came back POSITIVE. Spoke with patient who reports her symptoms have continued to improve since 12/22 and she has remained afebrile. - Symptom day 0 = 12/15, day 10 = 12/25, so patient will be able to end her isolation after isolation tomorrow, 12/25 - Patient is out of the window for monoclonal antibody infusino - Will provide work not for patient

## 2020-04-19 NOTE — Assessment & Plan Note (Signed)
Patient reports she is drinking on average one 16-ounce can of beer and one 12 oz canned cocktail (Clubtails) nightly. States she has cut back her drinking since having increased soon after her mother passed away in mid 12-12-2022.  She expresses interest in further counseling to cut back. Due to time limitations of today's visit, plan to discuss at next visit.  Plan: - Further counseling on alcohol cessation at next visit

## 2020-04-19 NOTE — Assessment & Plan Note (Signed)
Patient reports she completed 12-week therapy of Epclusa however did not follow-up with ID for follow-up labs.  - Provided patient with number for ID to schedule follow-up

## 2020-04-19 NOTE — Patient Instructions (Addendum)
[name],   Thank you for your visit to the [facility] today. It was a pleasure meeting you. Today we discussed the following:  1) Hypertension/Heart failure: your blood pressure was elevated at 149/101 - START spironolactone 25 mg daily - Continue Hyzaar, carvedilol, and amlodipine as prescribed - Monitor your BP at home if possible - I have placed an order for you to get a scan of your heart, an echocardiogram. Someone will call you to schedule this.   HOW TO TAKE YOUR BLOOD PRESSURE:  Rest 5 minutes before taking your blood pressure.  Don't smoke or drink caffeinated beverages for at least 30 minutes before.  Take your blood pressure before (not after) you eat.  Sit comfortably with your back supported and both feet on the floor (don't cross your legs).  Elevate your arm to heart level on a table or a desk.  Use the proper sized cuff. It should fit smoothly and snugly around your bare upper arm. There should be enough room to slip a fingertip under the cuff. The bottom edge of the cuff should be 1 inch above the crease of the elbow.  Ideally, take 3 measurements at one sitting and record the average.  2) Congestion and cough - we tested you for COVID today. You will receive a call with the results and next steps. The test will likely result in 2-3 days. In the meantime, try to stay away from others. Continue supportive care with adequate hydration and monitor for worsening symptoms.  - If your test comes back negative, HIGHLY recommend you get your COVID vaccine  3) Hepatitis C - Great job completing your treatment - Schedule follow-up with infectious disease at [phone]  4) Health maintenance - Mammogram: Call [facility] at [phone] to schedule your mammogram - Pap smear: you are due for a pap smear. We will plan to do this at a future appointment - Eye exam: I placed a referral for you to be scheduled for an  eye exam  5) Alcohol and cigarette use - Continue cutting back your use - I'd like to have a lengthier conversation about this with you at a future visit   We would like to see you back for hypertension and heart failure follow-up in 2-3 weeks. I would also like to see you in 3 months. Please bring all of your medications with you.   If you have any questions or concerns, please call our clinic at [phone] between 9am-5pm. Outside of these hours, call [phone] and ask for the internal medicine resident on call. If you feel you are having a medical emergency please call 911.

## 2020-04-19 NOTE — Assessment & Plan Note (Signed)
Discussed smoking cessation aids including nicotine patches, gum/lozenges, etc, and patient expressed interest in further counseling. Reports previously tried bupropion which was somewhat helpful. She expresses interest in learning more about Chantix. Unable to discuss at length today due to time limitations, however will discuss at future visit.  Plan: - Smoking cessation counseling at next visit

## 2020-04-19 NOTE — Assessment & Plan Note (Signed)
°-   Mammogram: Patient reports she needs to call to schedule mammogram as she missed their call. Provided contact information for the [facility] at [phone] - Pap smear: will plan to complete at 62-month follow-up - Eye exam: referral to ophthalmology placed for regular eye exam/refraction

## 2020-04-19 NOTE — Assessment & Plan Note (Signed)
See HFrEF problem for today's assessment.

## 2020-04-19 NOTE — Assessment & Plan Note (Signed)
BP of 149/101 today. Patient states she does not measure her BP at home. Reports adherence to losartan-HCTZ (Hyzaar) 100-25 mg daily, carvedilol 12.5 mg twice daily, and amlodipine 10 mg daily. Denies headaches, shortness of breath, dizziness. Endorses blurry vision, though notes she has been needing an evaluation for glasses for a while.  Assessment/Plan: Patient's BP is not at goal. Given her concomitant HFrEF, will start spironolactone. - continue losartan-HCTZ (Hyzaar) 100-25 mg daily, carvedilol 12.5 mg twice daily, amlodipine 10 mg daily - START spironolactone 25 mg daily

## 2020-04-19 NOTE — Progress Notes (Signed)
° °  CC: follow-up on HTN  HPI:  [name] is a 44 y.o. woman with history as below who presents to clinic for follow-up of her chronic medical conditions. Her last clinic visit was on 12/27/19.   To see the details of this patient's management of their acute and chronic problems, please refer to the Assessment & Plan under the Encounters tab.    Past Medical History:  Diagnosis Date   Abscess of left axilla 12/03/2019   Presents with 4d of painful abscess under L axilla after shaving. No fever or chills. It has not drained. On exam, there is a small area of draining pus surrounded by 1cm area of fluctuance and erythema, surrounded by a larger indurated area. Not significantly tender. No tracking noted. Small amount of pus expressed.   Hepatitis C    HFrEF (heart failure with reduced ejection fraction) ([place])    Hypertension    Left breast mass 07/22/2019   Review of Systems:    Review of Systems  Constitutional: Positive for malaise/fatigue. Negative for chills and fever.  HENT: Positive for congestion. Negative for sinus pain and sore throat.   Respiratory: Positive for cough. Negative for sputum production and shortness of breath.   Cardiovascular: Negative for chest pain, palpitations and leg swelling.  Gastrointestinal: Negative for nausea and vomiting.  Neurological: Negative for dizziness, weakness and headaches.    Physical Exam:  Vitals:   04/19/20 0940  BP: (!) 149/101  Pulse: 62  SpO2: 100%  Weight: 169 lb 11.2 oz (77 kg)   Constitutional: well-appearing woman sitting in chair, in no acute distress HENT: normocephalic atraumatic, mucous membranes moist Eyes: conjunctiva non-erythematous Neck: supple Cardiovascular: regular rate and rhythm, no m/r/g, no lower extremity edema Pulmonary/Chest: normal work of breathing on room air, lungs clear to auscultation bilaterally Abdominal: soft, non-tender, non-distended MSK: normal bulk and tone Neurological:  alert & oriented x 3, normal gait Skin: warm and dry    Assessment & Plan:   See Encounters Tab for problem based charting.  Patient seen with Dr. Dareen Piano

## 2020-04-19 NOTE — Assessment & Plan Note (Signed)
Patient cannot recall when her last pap smear was completed.   Plan: - Pap smear at visit in 3 months

## 2020-04-19 NOTE — Assessment & Plan Note (Addendum)
Patient last seen in clinic on 12/27/19 at which time she reported episodes of dizziness, lab work-up with CBC and BMP were unremarkable, orthostatic vitals were wnl. Pre-syncope suspected.   On evaluation today, she reports one additional episode since that time which occurred last week while she was bending over to fix a sheet (she works as a Secretary/administrator). Had sensation of eyes crossing, resolved with rest and did not have concomitant chest pain, dyspnea, palpitations. She denies symptoms of HF exacerbation such as dyspnea or lower extremity sweling. On exam, her lungs are clear and there is no lower extremity edema.  She reports since her last visit she now has health insurance. Previously followed by Dr. Doylene Canard, last echo in 2019.  Assessment/Plan: In setting of possible episodes of pre-syncope, uncontrolled hypertension, last echo in 2019, continued alcohol use, will obtain echocardiogram. Also starting spironolactone for HTN. - Echocardiogram - START spironolactone 25 mg daily - continue Hyzaar and Carvedilol (ARB/BB) - BMP at follow-up

## 2020-04-21 LAB — SARS-COV-2, NAA 2 DAY TAT

## 2020-04-21 LAB — NOVEL CORONAVIRUS, NAA: SARS-CoV-2, NAA: DETECTED — AB

## 2020-04-23 ENCOUNTER — Telehealth (HOSPITAL_COMMUNITY): Payer: Self-pay | Admitting: Nurse Practitioner

## 2020-04-23 DIAGNOSIS — U071 COVID-19: Secondary | ICD-10-CM

## 2020-04-23 NOTE — Telephone Encounter (Signed)
Called to discuss with [name] about Covid symptoms and the use of a monoclonal antibody infusion for those with mild to moderate Covid symptoms and at a high risk of hospitalization.     Pt does not qualify for infusion therapy given symptoms first presented > 10 days prior to timing of infusion. Symptoms tier reviewed as well as criteria for ending isolation. Preventative practices reviewed. Patient verbalized understanding  Patient Active Problem List   Diagnosis Date Noted  . Chest congestion 04/19/2020  . Pre-syncope 12/28/2019  . Health care maintenance 12/03/2019  . Cervical cancer screening 09/30/2017  . Alcohol use 08/01/2017  . Heart failure with reduced ejection fraction ([place]) 08/01/2017  . Hepatitis C 11/02/2007  . Tobacco use disorder 06/18/2007  . Essential hypertension 04/27/2007     Beckey Rutter, NP [phone] [email or web address]

## 2020-04-26 NOTE — Progress Notes (Signed)
Internal Medicine Clinic Attending  I saw and evaluated the patient.  I personally confirmed the key portions of the history and exam documented by Dr. Watson and I reviewed pertinent patient test results.  The assessment, diagnosis, and plan were formulated together and I agree with the documentation in the resident's note.  

## 2020-05-03 ENCOUNTER — Encounter: Payer: 59 | Admitting: Internal Medicine

## 2020-05-18 MED FILL — SPIRONOLACTONE 25 MG TABS: 25 | 30 days supply | Qty: 30 | Fill #1

## 2020-06-02 ENCOUNTER — Other Ambulatory Visit: Payer: Self-pay | Admitting: Student

## 2020-06-02 DIAGNOSIS — I10 Essential (primary) hypertension: Secondary | ICD-10-CM

## 2020-06-05 ENCOUNTER — Encounter: Payer: 59 | Admitting: Internal Medicine

## 2020-06-05 ENCOUNTER — Other Ambulatory Visit: Payer: Self-pay | Admitting: Internal Medicine

## 2020-06-05 ENCOUNTER — Telehealth: Payer: Self-pay

## 2020-06-05 MED FILL — LOSARTAN-HCTZ 100-25 MG TAB: 100-25 | 30 days supply | Qty: 30 | Fill #0

## 2020-06-05 NOTE — Telephone Encounter (Signed)
This was already sent today to [facility]. Hubbard Hartshorn, BSN, RN-BC

## 2020-06-05 NOTE — Telephone Encounter (Signed)
  losartan-hydrochlorothiazide (HYZAAR) 100-25 MG tablet, REFILL REQUEST @  [place], [place] - [address] [facility]. Phone:  [phone]  Fax:  [phone]

## 2020-06-12 ENCOUNTER — Encounter: Payer: 59 | Admitting: Internal Medicine

## 2020-06-12 ENCOUNTER — Telehealth: Payer: Self-pay

## 2020-06-14 ENCOUNTER — Ambulatory Visit (HOSPITAL_COMMUNITY): Payer: 59

## 2020-06-19 ENCOUNTER — Encounter: Payer: 59 | Admitting: Internal Medicine

## 2020-06-19 MED FILL — CARVEDILOL 12.5 MG TABLET: 12.5 | 90 days supply | Qty: 180 | Fill #5

## 2020-06-19 MED FILL — AMLODIPINE BESYLATE 10 MG T: 10 | 90 days supply | Qty: 90 | Fill #5

## 2020-06-19 MED FILL — LOSARTAN-HCTZ 100-25 MG TAB: 100-25 | 30 days supply | Qty: 30 | Fill #0

## 2020-06-19 MED FILL — SPIRONOLACTONE 25 MG TABS: 25 | 30 days supply | Qty: 30 | Fill #2

## 2020-06-19 NOTE — Progress Notes (Deleted)
   CC: ***  HPI:Ms.[name] L Tuman is a 45 y.o. female who presents for evaluation of ***. Please see individual problem based A/P for details.  HPI:  Assessment: ***  Plan: ***  HPI:  Assessment: ***  Plan: ***  HPI:  Assessment: ***  Plan: ***   Depression, PHQ-9: Based on the patients  [place] Visit from 04/19/2020 in [place]  PHQ-9 Total Score 0     score we have ***.  Past Medical History:  Diagnosis Date  . Abscess of left axilla 12/03/2019   Presents with 4d of painful abscess under L axilla after shaving. No fever or chills. It has not drained. On exam, there is a small area of draining pus surrounded by 1cm area of fluctuance and erythema, surrounded by a larger indurated area. Not significantly tender. No tracking noted. Small amount of pus expressed.  . Hepatitis C   . HFrEF (heart failure with reduced ejection fraction) ([place])   . Hypertension   . Left breast mass 07/22/2019   Review of Systems:   ROS   Physical Exam: There were no vitals filed for this visit.   General: *** HEENT: Normocephalic, atraumatic , Conjunctiva nl  Cardiovascular: Normal rate, regular rhythm.  No murmurs, rubs, or gallops Pulmonary : Equal breath sounds, No wheezes, rales, or rhonchi Abdominal: soft, nontender,  bowel sounds present   Assessment & Plan:   See Encounters Tab for problem based charting.  Patient {GC/GE:[id number]::"discussed with","seen with"} Dr. {[id number]::"P. Hoffman","Guilloud","Mullen","Narendra","Raines","Vincent","Williams"}

## 2020-06-27 ENCOUNTER — Ambulatory Visit (HOSPITAL_COMMUNITY)
Admission: RE | Admit: 2020-06-27 | Discharge: 2020-06-27 | Disposition: A | Discharge status: Home / Self Care | Payer: 59 | Source: Ambulatory Visit | Attending: Internal Medicine | Admitting: Internal Medicine

## 2020-06-27 ENCOUNTER — Other Ambulatory Visit: Payer: Self-pay

## 2020-06-27 DIAGNOSIS — F17200 Nicotine dependence, unspecified, uncomplicated: Secondary | ICD-10-CM | POA: Insufficient documentation

## 2020-06-27 DIAGNOSIS — I110 Hypertensive heart disease with heart failure: Secondary | ICD-10-CM | POA: Insufficient documentation

## 2020-06-27 DIAGNOSIS — I5020 Unspecified systolic (congestive) heart failure: Secondary | ICD-10-CM | POA: Insufficient documentation

## 2020-06-27 DIAGNOSIS — I351 Nonrheumatic aortic (valve) insufficiency: Secondary | ICD-10-CM | POA: Diagnosis not present

## 2020-06-27 LAB — ECHOCARDIOGRAM COMPLETE
Area-P 1/2: 3.53 cm2
P 1/2 time: 530 msec
S' Lateral: 2.3 cm

## 2020-06-27 NOTE — Progress Notes (Signed)
  Echocardiogram 2D Echocardiogram has been performed.  [name] 06/27/2020, 3:49 PM

## 2020-07-10 ENCOUNTER — Other Ambulatory Visit: Payer: Self-pay

## 2020-07-10 ENCOUNTER — Ambulatory Visit (INDEPENDENT_AMBULATORY_CARE_PROVIDER_SITE_OTHER): Payer: 59 | Admitting: Student

## 2020-07-10 ENCOUNTER — Other Ambulatory Visit (HOSPITAL_COMMUNITY): Payer: Self-pay | Admitting: Student

## 2020-07-10 ENCOUNTER — Encounter: Payer: Self-pay | Admitting: Student

## 2020-07-10 VITALS — BP 122/90 | HR 61 | Temp 98.2°F | Ht 62.0 in | Wt 171.9 lb

## 2020-07-10 DIAGNOSIS — I5022 Chronic systolic (congestive) heart failure: Secondary | ICD-10-CM | POA: Diagnosis not present

## 2020-07-10 DIAGNOSIS — Z124 Encounter for screening for malignant neoplasm of cervix: Secondary | ICD-10-CM

## 2020-07-10 DIAGNOSIS — I10 Essential (primary) hypertension: Secondary | ICD-10-CM

## 2020-07-10 DIAGNOSIS — I5020 Unspecified systolic (congestive) heart failure: Secondary | ICD-10-CM

## 2020-07-10 DIAGNOSIS — Z789 Other specified health status: Secondary | ICD-10-CM

## 2020-07-10 DIAGNOSIS — G5603 Carpal tunnel syndrome, bilateral upper limbs: Secondary | ICD-10-CM

## 2020-07-10 DIAGNOSIS — F17200 Nicotine dependence, unspecified, uncomplicated: Secondary | ICD-10-CM

## 2020-07-10 DIAGNOSIS — Z7289 Other problems related to lifestyle: Secondary | ICD-10-CM

## 2020-07-10 DIAGNOSIS — U071 COVID-19: Secondary | ICD-10-CM

## 2020-07-10 MED ORDER — CARVEDILOL 12.5 MG PO TABS: 12.5000 mg | ORAL_TABLET | Freq: Two times a day (BID) | ORAL | 3 refills | Status: DC

## 2020-07-10 MED ORDER — SPIRONOLACTONE 25 MG PO TABS: 25.0000 mg | ORAL_TABLET | Freq: Every day | ORAL | 11 refills | Status: DC

## 2020-07-10 MED ORDER — CHANTIX STARTING MONTH PAK 0.5 MG X 11 & 1 MG X 42 PO TABS: ORAL_TABLET | ORAL | 0 refills | Status: DC

## 2020-07-10 MED ORDER — LOSARTAN POTASSIUM-HCTZ 100-25 MG PO TABS: 1.0000 | ORAL_TABLET | Freq: Every day | ORAL | 1 refills | Status: DC

## 2020-07-10 MED ORDER — AMLODIPINE BESYLATE 10 MG PO TABS: 10.0000 mg | ORAL_TABLET | Freq: Every day | ORAL | 3 refills | Status: DC

## 2020-07-10 NOTE — Assessment & Plan Note (Signed)
Patient states she has cut down on her smoking since last visit, no smoking ~1/2 ppd where as before she had increased her use in the wake of her mother's passing. She expresses interest in starting Chantix.  Plan: - Chantix starter pack ordered - Counseled patient regarding possibility of vivid dreams - Requested patient scheduled 62-month telehealth follow-up to discuss tolerance of Chantix and additional prescription. Will need to help patient commit to quit date/plan.

## 2020-07-10 NOTE — Assessment & Plan Note (Signed)
Patient originally supposed to have pap smear today, however she would like to defer. She states this month is busy and she would like to wait until I am back in clinic in June.  Plan: - pap smear at visit in 3 months

## 2020-07-10 NOTE — Assessment & Plan Note (Signed)
Patient reports she has fully recovered from her COVID infection. Still expresses hesitancy regarding vaccination. Counseling provided.

## 2020-07-10 NOTE — Patient Instructions (Addendum)
[name],   Thank you for your visit to the [facility] today. It was a pleasure seeing you again. Today we discussed the following:  1) Hypertension, heart failure - your blood pressure was much better today compared to the last visit. Continue taking your BP medications (which I have refilled). I am getting lab work today to monitor your kidney function and electrolytes, and I will call you with the result. - Your heart function was much improved on your recent echocardiogram compared to your prior scan in 2019. Continue to watch your salt intake!  2) Smoking cessation - I have prescribed a Chantix starter pack to your pharmacy. Please let me know if you have any issues picking it up. - Remember, vivid dreams are a potential side effect - I'd like you to schedule a telemedicine visit after ~1 month to check in about the medication and to order additional if going well  3) Carpal tunnel - Recommend you buy over the counter carpal tunnel wrist splits and wear them every night.   4) COVID-19 Vaccine: We highly recommend getting vaccinated against COVID-19.  - The vaccines are SAFE.They went through all the required stages of clinical trials, and extensive testing and monitoring have shown that these vaccines are safe and effective. - The vaccines are EFFECTIVE. They lower your chances of getting and spreading the virus. They are proven to reduce your chance of severe illness, hospitalization, and death from COVID-19. - Getting vaccinated protects you, your loved ones, and people at risk for severe illness from COVID-19. - You can get vaccinated at any major pharmacy. If you are interested in setting up an appointment to get vaccinated through [facility], you can call the [place] COVID-19 hotline at [phone].   I would like you to schedule a telehealth appointment in 1 month (this visit will not be with me) and an in-person check-up in June (with  me). Please bring all of your medications with you.  Take care!   If you have any questions or concerns, please call our clinic at [phone] between 9am-5pm. Outside of these hours, call [phone] and ask for the internal medicine resident on call. If you feel you are having a medical emergency please call 911.

## 2020-07-10 NOTE — Assessment & Plan Note (Signed)
Patient reports she continues to drink on average a 16-oz can of beer and one 12 oz canned cocktail nightly after she gets off of work. Denies driving while or after drinking. Denies binge drinking or drinking so much she blacks out.  Plan: - continue to address at subsequent visits

## 2020-07-10 NOTE — Progress Notes (Signed)
   CC: follow-up on HTN, CHF, tobacco use  HPI:  [name] is a 45 y.o. woman with history as below who presents to clinic for follow-up of the above. Her last clinic visit was on 04/19/20.   To see the details of this patient's management of their acute and chronic problems, please refer to the Assessment & Plan under the Encounters tab.    Past Medical History:  Diagnosis Date  . Abscess of left axilla 12/03/2019   Presents with 4d of painful abscess under L axilla after shaving. No fever or chills. It has not drained. On exam, there is a small area of draining pus surrounded by 1cm area of fluctuance and erythema, surrounded by a larger indurated area. Not significantly tender. No tracking noted. Small amount of pus expressed.  . Hepatitis C   . HFrEF (heart failure with reduced ejection fraction) ([place])   . Hypertension   . Left breast mass 07/22/2019   Review of Systems:    Review of Systems  Constitutional: Negative for malaise/fatigue.  Eyes: Negative for blurred vision.  Respiratory: Negative for shortness of breath.   Cardiovascular: Negative for chest pain, palpitations, orthopnea, leg swelling and PND.  Neurological: Negative for dizziness, weakness and headaches.   Physical Exam:  Vitals:   07/10/20 0941 07/10/20 0948  BP: (!) 135/95 122/90  Pulse: 62 61  Temp: 98.2 F (36.8 C)   TempSrc: Oral   SpO2: 100%   Weight: 171 lb 14.4 oz (78 kg)   Height: 5\' 2"  (1.575 m)    Constitutional: well-appearing woman sitting in chair, in no acute distress HENT: normocephalic atraumatic, mucous membranes moist; wearing glasses Eyes: conjunctiva non-erythematous Neck: supple Cardiovascular: regular rate and rhythm, no m/r/g; no lower extremity edema Pulmonary/Chest: normal work of breathing on room air, lungs clear to auscultation bilaterally Abdominal: soft, non-distended MSK: normal bulk and tone Neurological: alert & oriented x 3,normal gait; paresthesias in  bilateral hands with Phalen's, only in R hand with Tinel's Skin: warm and dry Psych: normal mood and affect   Assessment & Plan:   See Encounters Tab for problem-based charting.  Patient discussed with Dr. Dareen Piano

## 2020-07-10 NOTE — Assessment & Plan Note (Addendum)
Since last visit 3 months ago, patient reports adherence to her medications, including the spiro 25 mg daily added at last visit. She states she has not had any additional pre-syncopal episodes. Denies dyspnea, lower extremity swelling, chest pain, palpitations.   Had echocardiogram on 06/27/20 (ordered at last visit) which demonstrated improvement in her LVEF from 35-40% to 60-65%.  Plan: - continue Hyzaar and Carvedilol (ARB/BB) - continue spironolactone 25 mg daily - BMP today -> wnl, K 4.0, sCr 0.71

## 2020-07-10 NOTE — Assessment & Plan Note (Addendum)
BP 135/95 -> 122/90 on repeat today. Patient states she does not check her BP at home. Reports adherence to losartan-HCTZ (Hyzaar) 100-25 mg daily, carvedilol 12.5 mg twice daily, amlodipine 10 mg daily, and spironolactone 25 mg daily. Arlyce Harman was started at last clinic visit 3 months ago. Denies blurry vision (went to the eye doctor since last visit and got glasses), headache, dizziness, lightheadedness.   Plan:  - continue losartan-HCTZ (Hyzaar) 100-25 mg daily, carvedilol 12.5 mg twice daily, amlodipine 10 mg daily, and spironolactone 25 mg daily - BMP today (started spironolactone 3 months ago)  ADDENDUM (07/11/20): - BMP unremarkable: K 4.0, sCr 0.71. Let patient know via MyChart.

## 2020-07-10 NOTE — Assessment & Plan Note (Addendum)
Patient reports she was previously told she has carpal tunnel. She states she has been having numbness and tingling "all over both hands" for many months. She states she tried her mother's wrist splints but only used them occasionally. She has been working as a Secretary/administrator for many years. She states she does not routinely rest on her elbows while working, mostly when using the bathroom.  On exam, paresthesias reproduced with Tinel's on the R, bilaterally with Phalen's bilaterally.   Assessment/Plan: Symptoms and exam suggestive of carpal tunnel syndrome, though would not explain paresthesias in ring and pinkie fingers. - instructed patient to buy OTC carpal tunnel splints to wear nightly - consider referral to ortho if no alleviation in symptoms (patient states she would like to avoid surgery) - TSH given bilateral symptoms -> within normal limits, notified patient via MyChart

## 2020-07-11 LAB — BMP8+ANION GAP
Anion Gap: 16 mmol/L (ref 10.0–18.0)
BUN/Creatinine Ratio: 14 (ref 9–23)
BUN: 10 mg/dL (ref 6–24)
CO2: 23 mmol/L (ref 20–29)
Calcium: 9.7 mg/dL (ref 8.7–10.2)
Chloride: 97 mmol/L (ref 96–106)
Creatinine, Ser: 0.71 mg/dL (ref 0.57–1.00)
Glucose: 80 mg/dL (ref 65–99)
Potassium: 4 mmol/L (ref 3.5–5.2)
Sodium: 136 mmol/L (ref 134–144)
eGFR: 107 mL/min/{1.73_m2} (ref >59)

## 2020-07-11 LAB — TSH: TSH: 0.756 u[IU]/mL (ref 0.450–4.500)

## 2020-07-12 NOTE — Progress Notes (Signed)
Internal Medicine Clinic Attending  Case discussed with Dr. Watson  At the time of the visit.  We reviewed the resident's history and exam and pertinent patient test results.  I agree with the assessment, diagnosis, and plan of care documented in the resident's note.  

## 2020-07-19 MED FILL — spIRONOLACTONE 25 MG TABS: 25 | 30 days supply | Qty: 30 | Fill #3

## 2020-07-29 ENCOUNTER — Other Ambulatory Visit (HOSPITAL_COMMUNITY): Payer: Self-pay

## 2020-08-01 ENCOUNTER — Other Ambulatory Visit (HOSPITAL_COMMUNITY): Payer: Self-pay

## 2020-08-01 MED FILL — Losartan Potassium & Hydrochlorothiazide Tab 100-25 MG: ORAL | 30 days supply | Qty: 30 | Fill #0 | Status: AC

## 2020-08-02 ENCOUNTER — Other Ambulatory Visit (HOSPITAL_COMMUNITY): Payer: Self-pay

## 2020-08-09 ENCOUNTER — Encounter: Payer: 59 | Admitting: Student

## 2020-08-09 ENCOUNTER — Telehealth: Payer: Self-pay | Admitting: *Deleted

## 2020-08-09 NOTE — Telephone Encounter (Signed)
Call to patient about missed appointment.  Rescheduled appointment.  Sander Nephew, RN 08/09/2020 11:25 AM.

## 2020-08-17 ENCOUNTER — Encounter: Payer: 59 | Admitting: Student

## 2020-08-18 ENCOUNTER — Telehealth: Payer: Self-pay

## 2020-08-18 NOTE — Telephone Encounter (Signed)
pls contact [phone] regarding hand pain

## 2020-08-18 NOTE — Telephone Encounter (Signed)
RTC, patient states she needs a letter which states she needs to be on light duty/limited duty.  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  RN informed patient she will need to be evaluated, appt made for Monday, 08/21/20, blue team/Dr. Lisabeth Devoid at [phone].  Pt informed she can discuss letter at office visit. SChaplin, RN,BSN

## 2020-08-21 ENCOUNTER — Other Ambulatory Visit (HOSPITAL_COMMUNITY): Payer: Self-pay

## 2020-08-21 ENCOUNTER — Encounter: Payer: 59 | Admitting: Student

## 2020-08-21 MED FILL — Spironolactone Tab 25 MG: ORAL | 30 days supply | Qty: 30 | Fill #0 | Status: AC

## 2020-08-21 NOTE — Telephone Encounter (Signed)
Pt was a no show for appt scheduled for today 04/25 @ 15:15 with Dr Lisabeth Devoid (although appt had been previously confirmed.) CMA did not attempt to reschedule appt, pt can call back to reschedule if needed.  No further action needed at this time, phone call complete.Despina Hidden Cassady4/25/20223:59 PM

## 2020-08-30 ENCOUNTER — Other Ambulatory Visit (HOSPITAL_COMMUNITY): Payer: Self-pay

## 2020-08-30 MED FILL — Carvedilol Tab 12.5 MG: ORAL | 90 days supply | Qty: 180 | Fill #0 | Status: CN

## 2020-08-30 MED FILL — Amlodipine Besylate Tab 10 MG (Base Equivalent): ORAL | 90 days supply | Qty: 90 | Fill #0 | Status: CN

## 2020-08-30 MED FILL — Losartan Potassium & Hydrochlorothiazide Tab 100-25 MG: ORAL | 30 days supply | Qty: 30 | Fill #1 | Status: AC

## 2020-09-04 ENCOUNTER — Other Ambulatory Visit (HOSPITAL_COMMUNITY): Payer: Self-pay

## 2020-09-11 ENCOUNTER — Telehealth: Payer: Self-pay

## 2020-09-11 DIAGNOSIS — Z1231 Encounter for screening mammogram for malignant neoplasm of breast: Secondary | ICD-10-CM

## 2020-09-11 NOTE — Telephone Encounter (Signed)
Patient reports missing her screening mammogram and needs a referral.   A/P: Encounter for screening mammogram for malignant neoplasm of breast - MM Digital Screening; Future

## 2020-09-11 NOTE — Telephone Encounter (Signed)
Pt needs a referral for breast center; pls contact [phone]

## 2020-09-12 ENCOUNTER — Other Ambulatory Visit (HOSPITAL_COMMUNITY): Payer: Self-pay

## 2020-09-19 ENCOUNTER — Other Ambulatory Visit (HOSPITAL_COMMUNITY): Payer: Self-pay

## 2020-09-19 MED FILL — Carvedilol Tab 12.5 MG: ORAL | 90 days supply | Qty: 180 | Fill #0 | Status: AC

## 2020-09-19 MED FILL — Losartan Potassium & Hydrochlorothiazide Tab 100-25 MG: ORAL | 30 days supply | Qty: 30 | Fill #2 | Status: CN

## 2020-09-19 MED FILL — Amlodipine Besylate Tab 10 MG (Base Equivalent): ORAL | 90 days supply | Qty: 90 | Fill #0 | Status: AC

## 2020-09-19 MED FILL — Spironolactone Tab 25 MG: ORAL | 30 days supply | Qty: 30 | Fill #1 | Status: AC

## 2020-09-23 ENCOUNTER — Encounter: Payer: Self-pay | Admitting: *Deleted

## 2020-09-29 ENCOUNTER — Other Ambulatory Visit (HOSPITAL_COMMUNITY): Payer: Self-pay

## 2020-09-29 MED FILL — Losartan Potassium & Hydrochlorothiazide Tab 100-25 MG: ORAL | 30 days supply | Qty: 30 | Fill #2 | Status: AC

## 2020-10-23 ENCOUNTER — Other Ambulatory Visit (HOSPITAL_COMMUNITY): Payer: Self-pay

## 2020-10-23 MED FILL — Spironolactone Tab 25 MG: ORAL | 30 days supply | Qty: 30 | Fill #2 | Status: AC

## 2020-10-24 ENCOUNTER — Other Ambulatory Visit (HOSPITAL_COMMUNITY): Payer: Self-pay

## 2020-10-31 ENCOUNTER — Encounter: Payer: Self-pay | Admitting: *Deleted

## 2020-10-31 ENCOUNTER — Other Ambulatory Visit (HOSPITAL_COMMUNITY): Payer: Self-pay

## 2020-10-31 MED FILL — Losartan Potassium & Hydrochlorothiazide Tab 100-25 MG: ORAL | 30 days supply | Qty: 30 | Fill #3 | Status: AC

## 2020-11-20 ENCOUNTER — Ambulatory Visit
Admission: RE | Admit: 2020-11-20 | Discharge: 2020-11-20 | Disposition: A | Discharge status: Home / Self Care | Payer: 59 | Source: Ambulatory Visit | Attending: Internal Medicine | Admitting: Internal Medicine

## 2020-11-20 ENCOUNTER — Other Ambulatory Visit: Payer: Self-pay

## 2020-11-20 DIAGNOSIS — Z1231 Encounter for screening mammogram for malignant neoplasm of breast: Secondary | ICD-10-CM

## 2020-11-22 ENCOUNTER — Other Ambulatory Visit: Payer: Self-pay | Admitting: Internal Medicine

## 2020-11-22 ENCOUNTER — Other Ambulatory Visit (HOSPITAL_COMMUNITY): Payer: Self-pay

## 2020-11-22 DIAGNOSIS — R928 Other abnormal and inconclusive findings on diagnostic imaging of breast: Secondary | ICD-10-CM

## 2020-11-22 MED FILL — Spironolactone Tab 25 MG: ORAL | 30 days supply | Qty: 30 | Fill #3 | Status: AC

## 2020-11-29 ENCOUNTER — Other Ambulatory Visit (HOSPITAL_COMMUNITY): Payer: Self-pay

## 2020-11-29 MED FILL — Losartan Potassium & Hydrochlorothiazide Tab 100-25 MG: ORAL | 25 days supply | Qty: 25 | Fill #4 | Status: AC

## 2020-11-29 MED FILL — Losartan Potassium & Hydrochlorothiazide Tab 100-25 MG: ORAL | 35 days supply | Qty: 35 | Fill #4 | Status: AC

## 2020-12-04 ENCOUNTER — Encounter: Payer: 59 | Admitting: Student

## 2020-12-11 ENCOUNTER — Ambulatory Visit
Admission: RE | Admit: 2020-12-11 | Discharge: 2020-12-11 | Disposition: A | Discharge status: Home / Self Care | Payer: 59 | Source: Ambulatory Visit | Attending: Internal Medicine | Admitting: Internal Medicine

## 2020-12-11 ENCOUNTER — Other Ambulatory Visit: Payer: Self-pay

## 2020-12-11 ENCOUNTER — Other Ambulatory Visit: Payer: Self-pay | Admitting: Internal Medicine

## 2020-12-11 DIAGNOSIS — R928 Other abnormal and inconclusive findings on diagnostic imaging of breast: Secondary | ICD-10-CM

## 2020-12-20 ENCOUNTER — Ambulatory Visit
Admission: RE | Admit: 2020-12-20 | Discharge: 2020-12-20 | Disposition: A | Discharge status: Home / Self Care | Payer: 59 | Source: Ambulatory Visit | Attending: Internal Medicine | Admitting: Internal Medicine

## 2020-12-20 ENCOUNTER — Other Ambulatory Visit: Payer: Self-pay

## 2020-12-20 ENCOUNTER — Other Ambulatory Visit: Payer: Self-pay | Admitting: Internal Medicine

## 2020-12-20 DIAGNOSIS — R928 Other abnormal and inconclusive findings on diagnostic imaging of breast: Secondary | ICD-10-CM

## 2020-12-25 ENCOUNTER — Other Ambulatory Visit (HOSPITAL_COMMUNITY): Payer: Self-pay

## 2020-12-25 MED FILL — Amlodipine Besylate Tab 10 MG (Base Equivalent): ORAL | 90 days supply | Qty: 90 | Fill #1 | Status: AC

## 2020-12-25 MED FILL — Spironolactone Tab 25 MG: ORAL | 30 days supply | Qty: 30 | Fill #4 | Status: AC

## 2020-12-25 MED FILL — Carvedilol Tab 12.5 MG: ORAL | 90 days supply | Qty: 180 | Fill #1 | Status: AC

## 2020-12-26 ENCOUNTER — Ambulatory Visit (INDEPENDENT_AMBULATORY_CARE_PROVIDER_SITE_OTHER): Payer: 59 | Admitting: Student

## 2020-12-26 DIAGNOSIS — I10 Essential (primary) hypertension: Secondary | ICD-10-CM

## 2020-12-26 DIAGNOSIS — Z124 Encounter for screening for malignant neoplasm of cervix: Secondary | ICD-10-CM | POA: Diagnosis not present

## 2020-12-26 DIAGNOSIS — F1090 Alcohol use, unspecified, uncomplicated: Secondary | ICD-10-CM

## 2020-12-26 DIAGNOSIS — Z789 Other specified health status: Secondary | ICD-10-CM

## 2020-12-26 DIAGNOSIS — B1920 Unspecified viral hepatitis C without hepatic coma: Secondary | ICD-10-CM | POA: Diagnosis not present

## 2020-12-26 DIAGNOSIS — Z7289 Other problems related to lifestyle: Secondary | ICD-10-CM | POA: Diagnosis not present

## 2020-12-26 NOTE — Assessment & Plan Note (Signed)
Patient underwent 12-week treatment for hepatitis C with Epclusa.  She will schedule a visit with ID to follow-up.

## 2020-12-26 NOTE — Assessment & Plan Note (Signed)
Patient will schedule appointment with PCP in approximately 3 months for Pap smear and other healthcare maintenance issues.

## 2020-12-26 NOTE — Patient Instructions (Addendum)
[name] it was a pleasure meeting you.   Regarding that aching leg pain that you experience at the end of the day try compression stockings while you are working. Please take these off at night. You can also try voltaren gel to help with the pain in your buttock and in your calf.   Regarding your blood pressure please continue to take your medications as like you are doing. Continue to work towards stopping smoking and decreasing your alcohol intake as these also contribute to elevated blood pressure.

## 2020-12-26 NOTE — Progress Notes (Signed)
    CC: F/u of her HTN  HPI:  [name] is a 45 y.o. F with PMH per below. Please see assessment and plan under notes tab for further details.    Past Medical History:  Diagnosis Date   Abscess of left axilla 12/03/2019   Presents with 4d of painful abscess under L axilla after shaving. No fever or chills. It has not drained. On exam, there is a small area of draining pus surrounded by 1cm area of fluctuance and erythema, surrounded by a larger indurated area. Not significantly tender. No tracking noted. Small amount of pus expressed.   Hepatitis C    HFrEF (heart failure with reduced ejection fraction) ([place])    Hypertension    Left breast mass 07/22/2019   Review of Systems:  Please see assessment and plan under notes tab for further details.    Physical Exam:  Vitals:   12/26/20 1507 12/26/20 1555  BP: (!) 129/91 125/87  Pulse: 71 62  Temp: 98.1 F (36.7 C)   TempSrc: Oral   SpO2: 100%   Weight: 176 lb 4.8 oz (80 kg)   Height: '5\' 2"'$  (1.575 m)    Gen: NAD CV:RRR Pulm: Non labored breathing on RA, no wheezing or rales  Abd: Soft, NT, ND  Ext: No edema of BLE  Neuro: Non focal   Assessment & Plan:   See Encounters Tab for problem based charting.  Patient discussed with Dr. Jimmye Norman

## 2020-12-26 NOTE — Assessment & Plan Note (Signed)
Patient continues to drink about 2 cans of beer daily.  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  She endorsed understanding, however at this time she is not ready to abstain

## 2020-12-26 NOTE — Assessment & Plan Note (Signed)
Patient here for follow-up regarding her hypertension.  She states she has had no orthostasis, lightheadedness, dizziness, or vision changes.  She has been adherent to her 4 drug antihypertensive regimen.  Her blood pressure at this clinic visit is 125/87.  Patient reports she continues to smoke about 6 to 7 cigarettes daily and she drinks approximately 2 beers daily.  Discussed with patient impact that both of these could have on her blood pressure.  She endorsed understanding.  She has a prescription for Chantix and is willing to try this to help with quitting cigarettes.  We will continue to address her alcohol use at each visit. Assessment and plan: -Continue amlodipine 10 mg daily, carvedilol 12.5 mg twice daily, losartan-HCTZ (100-25 mg), spironolactone 25 mg daily -Next clinic visit obtain BMP

## 2021-01-02 NOTE — Progress Notes (Signed)
Internal Medicine Clinic Attending  Case discussed with Dr. Carter  At the time of the visit.  We reviewed the resident's history and exam and pertinent patient test results.  I agree with the assessment, diagnosis, and plan of care documented in the resident's note.  

## 2021-01-23 ENCOUNTER — Other Ambulatory Visit: Payer: Self-pay | Admitting: Student

## 2021-01-23 ENCOUNTER — Other Ambulatory Visit (HOSPITAL_COMMUNITY): Payer: Self-pay

## 2021-01-23 DIAGNOSIS — I10 Essential (primary) hypertension: Secondary | ICD-10-CM

## 2021-01-23 MED ORDER — LOSARTAN POTASSIUM-HCTZ 100-25 MG PO TABS
1.0000 | ORAL_TABLET | Freq: Every day | ORAL | 1 refills | Status: DC
  Filled 2021-01-23: qty 90, 90d supply, fill #0
  Filled 2021-04-18: qty 90, 90d supply, fill #1

## 2021-01-23 MED FILL — Spironolactone Tab 25 MG: ORAL | 30 days supply | Qty: 30 | Fill #5 | Status: AC

## 2021-01-23 NOTE — Telephone Encounter (Signed)
Refill Request  losartan-hydrochlorothiazide (HYZAAR) 100-25 MG tablet   [facility] (Ph: [phone])

## 2021-01-24 ENCOUNTER — Encounter: Payer: 59 | Admitting: Internal Medicine

## 2021-01-31 ENCOUNTER — Ambulatory Visit (INDEPENDENT_AMBULATORY_CARE_PROVIDER_SITE_OTHER): Payer: 59 | Admitting: Internal Medicine

## 2021-01-31 VITALS — BP 143/100 | HR 67 | Temp 98.1°F | Wt 177.1 lb

## 2021-01-31 DIAGNOSIS — I10 Essential (primary) hypertension: Secondary | ICD-10-CM

## 2021-01-31 DIAGNOSIS — M7918 Myalgia, other site: Secondary | ICD-10-CM

## 2021-01-31 DIAGNOSIS — G5702 Lesion of sciatic nerve, left lower limb: Secondary | ICD-10-CM

## 2021-01-31 DIAGNOSIS — G5700 Lesion of sciatic nerve, unspecified lower limb: Secondary | ICD-10-CM | POA: Insufficient documentation

## 2021-01-31 NOTE — Patient Instructions (Addendum)
Ms [name],  It was a pleasure seeing you in clinic. Today we discussed:   Piriformis Syndrome - See exercises provided; Referral to physical therapy placed.  Hypertension - continue to take medications as prescribed. Follow up in 1 week.   If you have any questions or concerns, please call our clinic at [phone] between 9am-5pm and after hours call [phone] and ask for the internal medicine resident on call. If you feel you are having a medical emergency please call 911.   Thank you, we look forward to helping you remain healthy!

## 2021-01-31 NOTE — Assessment & Plan Note (Signed)
BP Readings from Last 3 Encounters:  01/31/21 (!) 143/100  12/26/20 125/87  07/10/20 122/90   Patient has a history of hypertension for which she is on Hyzaar 100-25mg  daily, amlodipine 10mg  daily, coreg 12.5mg  bid and spironolactone 25mg  daily. She reports medication adherence. However, has had increased alcohol intake.  Her blood pressure at this visit is elevated to 152/111 with repeat of 143/100. She denies any headaches, lightheadedness/dizziness, vision changes, chest pain, shortness of breath or focal weakness.  Plan: Continue current regimen Follow up in 1-2 weeks for BP check and medication adjustment as needed

## 2021-01-31 NOTE — Progress Notes (Signed)
   CC: left leg pain  HPI:  Ms.Anessia L Barnes is a 45 y.o. female with PMHx as stated below presenting for evaluation of left leg pain for several weeks duration. She notes that she was previously evaluated for this and was recommended for symptomatic treatment; however, pain has been persistent. Please see problem based charting for complete assessment and plan.  Past Medical History:  Diagnosis Date   Abscess of left axilla 12/03/2019   Presents with 4d of painful abscess under L axilla after shaving. No fever or chills. It has not drained. On exam, there is a small area of draining pus surrounded by 1cm area of fluctuance and erythema, surrounded by a larger indurated area. Not significantly tender. No tracking noted. Small amount of pus expressed.   Hepatitis C    HFrEF (heart failure with reduced ejection fraction) ([place])    Hypertension    Left breast mass 07/22/2019   Review of Systems:  Negative except as stated in HPI.  Physical Exam:  Vitals:   01/31/21 1413 01/31/21 1427  BP: (!) 152/111 (!) 143/100  Pulse: 73 67  Temp: 98.1 F (36.7 C)   TempSrc: Oral   SpO2: 100%   Weight: 177 lb 1.6 oz (80.3 kg)    Physical Exam  Constitutional: Appears well-developed and well-nourished. No distress.  Musculoskeletal: Normal bulk and tone.  No peripheral edema noted. Full ROM in bilateral hips and knees without pain or tenderness; point tenderness at left piriformis muscle; distal pulses intact Neurological: Is alert and oriented x4, Strength 5/5 in BLE.  Skin: Warm and dry.  No rash, erythema, lesions noted. Psychiatric: Normal mood and affect.    Assessment & Plan:   See Encounters Tab for problem based charting.  Patient discussed with Dr.  Saverio Danker

## 2021-01-31 NOTE — Assessment & Plan Note (Signed)
Patient presented for evaluation of ongoing left leg pain. She notes that this has been present for several weeks despite symptomatic treatment. She reports pain that starts in her left buttocks and radiates down her leg mostly present at night. On examination, has point tenderness at the left piriformis muscle; full active and passive ROM in hip and knee.  Suspect she has piriformis syndrome.  Plan: Referral to physical therapy  Continue symptomatic treatment

## 2021-02-01 NOTE — Progress Notes (Signed)
Internal Medicine Clinic Attending  Case discussed with Dr. Marva Panda at the time of the visit.  We reviewed the resident's history and exam and pertinent patient test results.  I agree with the assessment, diagnosis, and plan of care documented in the resident's note. Previous BP well controlled on current regimen, monitor home pressures and return in 1-2 weeks for review and possible medication adjustment.

## 2021-02-12 ENCOUNTER — Telehealth: Payer: Self-pay | Admitting: *Deleted

## 2021-02-12 ENCOUNTER — Encounter: Payer: 59 | Admitting: Internal Medicine

## 2021-02-12 NOTE — Telephone Encounter (Signed)
Call to patient concerning missed appointment.  Message was left fo patient to call the Clinics to reschedule if needed.   Sander Nephew, RN 02/12/2021 10:13 AM

## 2021-02-20 ENCOUNTER — Ambulatory Visit (INDEPENDENT_AMBULATORY_CARE_PROVIDER_SITE_OTHER): Payer: 59 | Admitting: Internal Medicine

## 2021-02-20 ENCOUNTER — Other Ambulatory Visit (HOSPITAL_COMMUNITY): Payer: Self-pay

## 2021-02-20 DIAGNOSIS — I10 Essential (primary) hypertension: Secondary | ICD-10-CM

## 2021-02-20 DIAGNOSIS — Z0000 Encounter for general adult medical examination without abnormal findings: Secondary | ICD-10-CM | POA: Diagnosis not present

## 2021-02-20 DIAGNOSIS — G5700 Lesion of sciatic nerve, unspecified lower limb: Secondary | ICD-10-CM | POA: Diagnosis not present

## 2021-02-20 MED ORDER — CARVEDILOL 25 MG PO TABS
25.0000 mg | ORAL_TABLET | Freq: Two times a day (BID) | ORAL | 3 refills | Status: DC
  Filled 2021-02-20: qty 180, 90d supply, fill #0
  Filled 2021-03-12: qty 60, 30d supply, fill #0
  Filled 2021-04-10: qty 60, 30d supply, fill #1
  Filled 2021-05-08: qty 60, 30d supply, fill #2
  Filled 2021-06-10: qty 60, 30d supply, fill #3
  Filled 2021-07-10: qty 60, 30d supply, fill #4
  Filled 2021-08-10: qty 60, 30d supply, fill #5

## 2021-02-20 MED FILL — Spironolactone Tab 25 MG: ORAL | 30 days supply | Qty: 30 | Fill #6 | Status: AC

## 2021-02-20 NOTE — Assessment & Plan Note (Signed)
Patient states she is still experiencing piriformis pain.  Patient states that stretching has provided partial relief.  Patient has also tried over-the-counter pain medication and heating pad with minimal relief.  During prior visit patient was referred to physical therapy; patient states she has not received a call back.  Upon chart review, patient was called and voicemail was left.   PLAN: Provided patient with physical therapy debilitation center phone number to call and schedule an appointment Continue symptomatic relief with stretching, Tylenol for pain relief and heating pad as needed.

## 2021-02-20 NOTE — Patient Instructions (Signed)
Increase your Carvedilol from 12.5 to 25mg  twice daily   I referred you to a sleep clinic for sleep study. They will give you a call this week. If you don't hear anything by Friday, please call the office   I gave you the number for the Physical therapy location, please call them and schedule an appointment, they already have you in the system.   Follow up in December for pap smear and general check up  Midol is okay to take for menstrual cramps and tylenol for pain. Please avoid ibuprofen and aleve

## 2021-02-20 NOTE — Assessment & Plan Note (Signed)
Patient's current blood pressure remains not at goal.  Blood pressure in office 139/100 and on repeat 149/102.  Patient currently taking Hyzaar 100 mg- 25 mg; Norvasc 10 mg; Coreg 12.5 mg twice daily; spironolactone 25 mg daily.  Patient states she is compliant with her medications.  Some secondary causes including OSA, patient states in the past she was referred to sleep clinic but did not complete the sleep study.  Patient reports family history of OSA in her mother.  Patient reports recent stressors, her mother passed away and her sister passed away.  Patient states she also uses NSAIDs for pain relief.  NSAIDs and recent stressors in her life could contribute to persistent hypertension despite 4 drug regimen.   PLAN: Increase Coreg 12.5 to 25 mg twice daily; continue Hyzaar 100 mg- 25 mg; Norvasc 10 mg; spironolactone 25 mg Referral to sleep clinic Follow-up in 2 months

## 2021-02-20 NOTE — Assessment & Plan Note (Addendum)
Patient declines flu vaccine at this time. Pap smear will be performed on follow-up visit in December 2022

## 2021-02-20 NOTE — Progress Notes (Signed)
CC: BP check; piriformis syndrome  HPI:  [name] is a 45 y.o. female with a past medical history stated below and presents today for cc listed above. Please see problem based assessment and plan for additional details.  Past Medical History:  Diagnosis Date   Abscess of left axilla 12/03/2019   Presents with 4d of painful abscess under L axilla after shaving. No fever or chills. It has not drained. On exam, there is a small area of draining pus surrounded by 1cm area of fluctuance and erythema, surrounded by a larger indurated area. Not significantly tender. No tracking noted. Small amount of pus expressed.   Hepatitis C    HFrEF (heart failure with reduced ejection fraction) ([facility])    Hypertension    Left breast mass 07/22/2019    Current Outpatient Medications on File Prior to Visit  Medication Sig Dispense Refill   amLODipine (NORVASC) 10 MG tablet TAKE 1 TABLET (10 MG TOTAL) BY MOUTH DAILY. 90 tablet 3   losartan-hydrochlorothiazide (HYZAAR) 100-25 MG tablet Take 1 tablet by mouth daily. 90 tablet 1   Sofosbuvir-Velpatasvir (EPCLUSA) 400-100 MG TABS Take 1 tablet by mouth daily. 28 tablet 2   spironolactone (ALDACTONE) 25 MG tablet Take 1 tablet (25 mg total) by mouth daily. 30 tablet 11   varenicline (CHANTIX STARTING MONTH PAK) 0.5 MG X 11 & 1 MG X 42 tablet Take one 0.5 mg tablet by mouth once daily for 3 days, then increase to one 0.5 mg tablet twice daily for 4 days, then increase to one 1 mg tablet twice daily. 53 tablet 0   varenicline (CHANTIX) 0.5 MG tablet TAKE 1 TABLET BY MOUTH DAILY FOR 3 DAYS, THEN 1 TAB 2 TIMES DAILY FOR 4 DAYS. THEN FILL MAINTENANCE PRESCRIPTION 11 tablet 0   varenicline (CHANTIX) 1 MG tablet TAKE 1 TABLET BY MOUTH 2 TIMES A DAY (START WHEN FINISHED WITH 0.5MG  TABS) 42 tablet 0   No current facility-administered medications on file prior to visit.    Family History  Problem Relation Age of Onset   Hypertension Mother    Hyperlipidemia  Mother    Diabetes Mother    Hepatitis C Mother    Diabetes Maternal Aunt     Social History   Socioeconomic History   Marital status: Single    Spouse name: Not on file   Number of children: Not on file   Years of education: Not on file   Highest education level: Not on file  Occupational History   Not on file  Tobacco Use   Smoking status: Some Days    Packs/day: 0.50    Types: Cigarettes   Smokeless tobacco: Never   Tobacco comments:    6-7 per day ./  has Vaped   Substance and Sexual Activity   Alcohol use: Yes    Alcohol/week: 28.0 standard drinks    Types: 28 Standard drinks or equivalent per week   Drug use: Yes    Frequency: 0.5 times per week    Types: Marijuana   Sexual activity: Not on file  Other Topics Concern   Not on file  Social History Narrative   Not on file   Social Determinants of Health   Financial Resource Strain: Not on file  Food Insecurity: Not on file  Transportation Needs: Not on file  Physical Activity: Not on file  Stress: Not on file  Social Connections: Not on file  Intimate Partner Violence: Not on file  Review of Systems: ROS negative except for what is noted on the assessment and plan.  Vitals:   02/20/21 0907 02/20/21 0933  BP: (!) 139/100 (!) 147/109  Pulse: 64   Temp: 98.5 F (36.9 C)   TempSrc: Oral   SpO2: 99%   Weight: 181 lb 8 oz (82.3 kg)      Physical Exam: Constitutional: well-appearing overweight woman sitting in the chair, in no acute distress HENT: normocephalic atraumatic, mucous membranes moist Eyes: conjunctiva non-erythematous Neck: supple Cardiovascular: regular rate and rhythm, no m/r/g Pulmonary/Chest: normal work of breathing on room air, lungs clear to auscultation bilaterally Abdominal: soft, non-tender, non-distended MSK: normal bulk and tone Neurological: alert & oriented x 3, 5/5 strength in bilateral upper and lower extremities, normal gait Skin: warm and dry Psych: normal  mood   Assessment & Plan:   See Encounters Tab for problem based charting.  Patient seen with Dr. Pecolia Ades, M.D. [facility], PGY-1 Pager: [phone], Phone: [phone] Date 02/20/2021 Time 9:42 AM

## 2021-02-21 ENCOUNTER — Other Ambulatory Visit (HOSPITAL_COMMUNITY): Payer: Self-pay

## 2021-02-26 ENCOUNTER — Other Ambulatory Visit: Payer: Self-pay

## 2021-02-26 ENCOUNTER — Ambulatory Visit: Payer: 59 | Attending: Internal Medicine

## 2021-02-26 VITALS — BP 125/85

## 2021-02-26 DIAGNOSIS — M6281 Muscle weakness (generalized): Secondary | ICD-10-CM | POA: Insufficient documentation

## 2021-02-26 DIAGNOSIS — M79605 Pain in left leg: Secondary | ICD-10-CM | POA: Diagnosis not present

## 2021-02-26 DIAGNOSIS — M7918 Myalgia, other site: Secondary | ICD-10-CM | POA: Diagnosis not present

## 2021-02-26 NOTE — Therapy (Signed)
[place], [place], [address] Phone: [phone]   Fax:  [phone]  Physical Therapy Evaluation  Patient Details  Name: [name] MRN: [id number] Date of Birth: Dec 26, 1975 Referring Provider (PT): Aldine Contes, MD   Encounter Date: 02/26/2021   PT End of Session - 02/26/21 1110     Visit Number 1    Number of Visits 9    Date for PT Re-Evaluation 04/23/21    Authorization Type [employer] - FOTO 6th and 10th    PT Start Time 3009    PT Stop Time 1110    PT Time Calculation (min) 30 min    Activity Tolerance Patient tolerated treatment well;No increased pain    Behavior During Therapy [facility] for tasks assessed/performed             Past Medical History:  Diagnosis Date   Abscess of left axilla 12/03/2019   Presents with 4d of painful abscess under L axilla after shaving. No fever or chills. It has not drained. On exam, there is a small area of draining pus surrounded by 1cm area of fluctuance and erythema, surrounded by a larger indurated area. Not significantly tender. No tracking noted. Small amount of pus expressed.   Hepatitis C    HFrEF (heart failure with reduced ejection fraction) ([place])    Hypertension    Left breast mass 07/22/2019    Past Surgical History:  Procedure Laterality Date   LEFT AND RIGHT HEART CATHETERIZATION WITH CORONARY ANGIOGRAM N/A 06/23/2013   Procedure: LEFT AND RIGHT HEART CATHETERIZATION WITH CORONARY ANGIOGRAM;  Surgeon: Birdie Riddle, MD;  Location: [facility];  Service: Cardiovascular;  Laterality: N/A;   LIVER BIOPSY     LIVER BIOPSY      Vitals:   02/26/21 1037  BP: 125/85      Subjective Assessment - 02/26/21 1037     Subjective Pt presents to PT with reports of L posterior hip pain that extends down to L calf. She denies any MOI and notes a gradual onset of increasing L LE pain. She notes that pain is mainly at night after long days, has a  good bit of trouble getting comfortable. Denies bowel/bladder changes or saddle anesthesia.    Pertinent History one month hx of posterior L LE pain    How long can you sit comfortably? indefinite    How long can you stand comfortably? indefinite    How long can you walk comfortably? indefinite    Patient Stated Goals pt would like to decrease L LE pain in order to get back to increasing activity level and exercise    Currently in Pain? Yes    Pain Score 4     Pain Location Calf    Pain Orientation Left    Pain Descriptors / Indicators Aching;Dull    Pain Type Acute pain    Pain Onset 1 to 4 weeks ago    Pain Frequency Intermittent    Aggravating Factors  cold weather    Pain Relieving Factors heat           [facility] Adult PT Treatment/Exercise:   Therapeutic Exercise:  Seated sciatic nerve glide x 15 L Supine sciatic nerve glide x 15 L Bridge x 10 - 3 sec hold Supine fig 4 stretch x 30 sec     [facility] PT Assessment - 02/26/21 0001       Assessment   Medical Diagnosis M79.18 (ICD-10-CM) - Piriformis  muscle pain    Referring Provider (PT) Aldine Contes, MD    Hand Dominance Right    Prior Therapy None      Precautions   Precautions None      Restrictions   Weight Bearing Restrictions No      Balance Screen   Has the patient fallen in the past 6 months No    Has the patient had a decrease in activity level because of a fear of falling?  No    Is the patient reluctant to leave their home because of a fear of falling?  No      Home Environment   Living Environment Private residence    Living Arrangements Spouse/significant other    Additional Comments lives in hotel with spouse; level entry with elevator      Prior Function   Level of Independence Independent;Independent with basic ADLs    Vocation Full time employment    Vocation Requirements housekeeping for [employer]   Overall Cognitive Status Within Functional Limits for tasks assessed     Attention Focused      Observation/Other Assessments   Focus on Therapeutic Outcomes (FOTO)  86% function; 30% predicted      Functional Tests   Functional tests Sit to Stand      Sit to Stand   Comments 30 Sec STS: 12 reps with 6/10 L hip pain      PROM   Overall PROM Comments decreased L hip ER in supine      Palpation   Palpation comment TTP with familar pain to L piriformis      Special Tests   Other special tests Positive FAIR test L                        Objective measurements completed on examination: See above findings.                PT Education - 02/26/21 1110     Education Details eval findings, FOTO, HEP, POC    Person(s) Educated Patient    Methods Explanation;Demonstration;Handout    Comprehension Verbalized understanding;Returned demonstration              PT Short Term Goals - 02/26/21 1045       PT SHORT TERM GOAL #1   Title Pt will be compliant and knowledgeable with initial HEP for improved comfort and carryover    Baseline initial HEP given    Time 3    Period Weeks    Status New    Target Date 03/19/21               PT Long Term Goals - 02/26/21 1046       PT LONG TERM GOAL #1   Title Pt will improve FOTO function to no less than 90% as proxy for functional improvement    Baseline 86% function    Time 8    Period Weeks    Status New    Target Date 04/23/21      PT LONG TERM GOAL #2   Title Pt will improve reps in 30 Sec STS to no less than 14 for improved functional mobility and strength with no increase in L hip pian    Baseline 12 reps 6/10 L hip pain    Time 8    Period Weeks    Target Date 04/23/21      PT  LONG TERM GOAL #3   Title Pt will self report L LE pain no greater than 3/10 at worst for improved comfort and function    Baseline 10/10 at worst    Time 8    Period Weeks    Status New    Target Date 04/23/21                    Plan - 02/26/21 1110     Clinical  Impression Statement Pt is a pleasant 45 y/o F who presents to PT with reports of roughly one month hx of L posterior hip to calf pain. Physical findings are consistent with MD impression, as pt had positive slump and FAIR test, indicating piriformis irritation and referral. She also had palpable trigger point, with referral of symptoms into distal L LE. She would benefit from skilled PT services working on improving strength and muscle length while decresaing pain and neural tension.    Personal Factors and Comorbidities Comorbidity 2;Fitness    Comorbidities PMH: HTN, HFrEF    Examination-Activity Limitations Squat;Stand;Sleep    Examination-Participation Restrictions Occupation;Community Activity    Stability/Clinical Decision Making Stable/Uncomplicated    Clinical Decision Making Low    Rehab Potential Excellent    PT Frequency 1x / week    PT Duration 8 weeks    PT Treatment/Interventions ADLs/Self Care Home Management;Electrical Stimulation;Moist Heat;Gait training;Stair training;Functional mobility training;Therapeutic activities;Therapeutic exercise;Balance training;Neuromuscular re-education;Patient/family education;Manual techniques;Passive range of motion;Dry needling;Vasopneumatic Device;Taping    PT Next Visit Plan assess response to HEP, DN to L piriformis, progress as able    PT Home Exercise Plan Access Code: [email or web address]    Consulted and Agree with Plan of Care Patient             Patient will benefit from skilled therapeutic intervention in order to improve the following deficits and impairments:  Decreased activity tolerance, Decreased endurance, Decreased mobility, Decreased strength, Pain  Visit Diagnosis: Pain in left leg - Plan: PT plan of care cert/re-cert  Muscle weakness (generalized) - Plan: PT plan of care cert/re-cert     Problem List Patient Active Problem List   Diagnosis Date Noted   Piriformis syndrome 01/31/2021   Carpal tunnel syndrome, bilateral  07/10/2020   COVID-19 04/19/2020   Health care maintenance 12/03/2019   Cervical cancer screening 09/30/2017   Alcohol use 08/01/2017   hx of Heart failure with reduced ejection fraction ([place]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

## 2021-02-28 ENCOUNTER — Other Ambulatory Visit (HOSPITAL_COMMUNITY): Payer: Self-pay

## 2021-03-05 NOTE — Progress Notes (Signed)
Internal Medicine Clinic Attending  I saw and evaluated the patient.  I personally confirmed the key portions of the history and exam documented by Dr. Ariwodo and I reviewed pertinent patient test results.  The assessment, diagnosis, and plan were formulated together and I agree with the documentation in the resident's note.   

## 2021-03-12 ENCOUNTER — Other Ambulatory Visit: Payer: Self-pay

## 2021-03-12 ENCOUNTER — Ambulatory Visit: Payer: 59 | Attending: Internal Medicine

## 2021-03-12 ENCOUNTER — Other Ambulatory Visit (HOSPITAL_COMMUNITY): Payer: Self-pay

## 2021-03-12 DIAGNOSIS — M79605 Pain in left leg: Secondary | ICD-10-CM | POA: Insufficient documentation

## 2021-03-12 DIAGNOSIS — M6281 Muscle weakness (generalized): Secondary | ICD-10-CM | POA: Insufficient documentation

## 2021-03-12 NOTE — Therapy (Addendum)
[place], [place], [address] Phone: [phone]   Fax:  [phone]  Physical Therapy Treatment/Discharge  Patient Details  Name: [name] MRN: [id number] Date of Birth: 12/26/1975 Referring Provider (PT): Aldine Contes, MD   Encounter Date: 03/12/2021   PT End of Session - 03/12/21 1346     Visit Number 2    Number of Visits 9    Date for PT Re-Evaluation 04/23/21    Authorization Type [employer] - FOTO 6th and 10th    PT Start Time 1350    PT Stop Time 1428    PT Time Calculation (min) 38 min    Activity Tolerance Patient tolerated treatment well;No increased pain    Behavior During Therapy [facility] for tasks assessed/performed             Past Medical History:  Diagnosis Date   Abscess of left axilla 12/03/2019   Presents with 4d of painful abscess under L axilla after shaving. No fever or chills. It has not drained. On exam, there is a small area of draining pus surrounded by 1cm area of fluctuance and erythema, surrounded by a larger indurated area. Not significantly tender. No tracking noted. Small amount of pus expressed.   Hepatitis C    HFrEF (heart failure with reduced ejection fraction) ([employer])    Hypertension    Left breast mass 07/22/2019    Past Surgical History:  Procedure Laterality Date   LEFT AND RIGHT HEART CATHETERIZATION WITH CORONARY ANGIOGRAM N/A 06/23/2013   Procedure: LEFT AND RIGHT HEART CATHETERIZATION WITH CORONARY ANGIOGRAM;  Surgeon: Birdie Riddle, MD;  Location: [facility];  Service: Cardiovascular;  Laterality: N/A;   LIVER BIOPSY     LIVER BIOPSY      There were no vitals filed for this visit.   Subjective Assessment - 03/12/21 1351     Subjective Pt presents to PT with reports of no current L posterior hip pain, but had some pain this morning. Has been compliant with her HEP with no adverse effects noted, and states that they help alleviate pain. Pt  is ready to begin PT treatment at this time.    Currently in Pain? No/denies    Pain Score 0-No pain           [facility] Adult PT Treatment/Exercise:   Therapeutic Exercise:  Bike L2 x 3 min while taking subjective Supine sciatic nerve glide 2x15 L Bridge 2x10 - 3 sec hold Supine piriformis stretch 3x30 sec S/L clam 3x10 yellow tband ea  Manual Therapy :  Manual stretching to L piriformis, L hip ERs Trigger point release to L piriformis                               PT Short Term Goals - 02/26/21 1045       PT SHORT TERM GOAL #1   Title Pt will be compliant and knowledgeable with initial HEP for improved comfort and carryover    Baseline initial HEP given    Time 3    Period Weeks    Status New    Target Date 03/19/21               PT Long Term Goals - 02/26/21 1046       PT LONG TERM GOAL #1   Title Pt will improve FOTO function to no less than 90% as proxy for  functional improvement    Baseline 86% function    Time 8    Period Weeks    Status New    Target Date 04/23/21      PT LONG TERM GOAL #2   Title Pt will improve reps in 30 Sec STS to no less than 14 for improved functional mobility and strength with no increase in L hip pian    Baseline 12 reps 6/10 L hip pain    Time 8    Period Weeks    Target Date 04/23/21      PT LONG TERM GOAL #3   Title Pt will self report L LE pain no greater than 3/10 at worst for improved comfort and function    Baseline 10/10 at worst    Time 8    Period Weeks    Status New    Target Date 04/23/21                   Plan - 03/12/21 1409     Clinical Impression Statement Pt was able to complete prescribed exercises with no adverse effect or increase in pain. She responded well to manual therapy interventions, noting decreased discomfort post session. HEP updated for continued strengthening and stretching to L pirifomis. Will continue to progress as tolerated per POC.    PT  Treatment/Interventions ADLs/Self Care Home Management;Electrical Stimulation;Moist Heat;Gait training;Stair training;Functional mobility training;Therapeutic activities;Therapeutic exercise;Balance training;Neuromuscular re-education;Patient/family education;Manual techniques;Passive range of motion;Dry needling;Vasopneumatic Device;Taping    PT Next Visit Plan assess response to HEP, DN to L piriformis - possible, mentioned to pt, progress as able    PT Home Exercise Plan Access Code: [email or web address]             Patient will benefit from skilled therapeutic intervention in order to improve the following deficits and impairments:  Decreased activity tolerance, Decreased endurance, Decreased mobility, Decreased strength, Pain  Visit Diagnosis: Pain in left leg  Muscle weakness (generalized)     Problem List Patient Active Problem List   Diagnosis Date Noted   Piriformis syndrome 01/31/2021   Carpal tunnel syndrome, bilateral 07/10/2020   COVID-19 04/19/2020   Health care maintenance 12/03/2019   Cervical cancer screening 09/30/2017   Alcohol use 08/01/2017   hx of Heart failure with reduced ejection fraction ([place]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, resolved as of echo 06/2020 08/01/2017   Hx of hepatitis C 11/02/2007   Tobacco use disorder 06/18/2007   Essential hypertension 04/27/2007    Ward Chatters, PT 03/12/2021, 2:33 PM  [facility] [address] [place], [place], [address] Phone: [phone]   Fax:  [phone]  Name: [name] MRN: [id number] Date of Birth: [birth year]   PHYSICAL THERAPY DISCHARGE SUMMARY  Visits from Start of Care: 2  Current functional level related to goals / functional outcomes: N/A   Remaining deficits: N/A   Education / Equipment: N/A   Patient agrees to discharge. Patient goals were unable to assess. Patient is being discharged due to not returning since the last visit.

## 2021-03-19 MED FILL — Spironolactone Tab 25 MG: ORAL | 30 days supply | Qty: 30 | Fill #7 | Status: AC

## 2021-03-19 MED FILL — Amlodipine Besylate Tab 10 MG (Base Equivalent): ORAL | 90 days supply | Qty: 90 | Fill #2 | Status: AC

## 2021-03-20 ENCOUNTER — Other Ambulatory Visit (HOSPITAL_COMMUNITY): Payer: Self-pay

## 2021-03-20 ENCOUNTER — Ambulatory Visit: Payer: 59 | Admitting: Physical Therapy

## 2021-03-26 ENCOUNTER — Ambulatory Visit: Payer: 59

## 2021-04-02 ENCOUNTER — Ambulatory Visit: Payer: 59

## 2021-04-11 ENCOUNTER — Other Ambulatory Visit (HOSPITAL_COMMUNITY): Payer: Self-pay

## 2021-04-17 ENCOUNTER — Ambulatory Visit: Payer: 59 | Attending: Internal Medicine

## 2021-04-18 MED FILL — Spironolactone Tab 25 MG: ORAL | 30 days supply | Qty: 30 | Fill #8 | Status: AC

## 2021-04-19 ENCOUNTER — Encounter: Payer: 59 | Admitting: Internal Medicine

## 2021-04-19 ENCOUNTER — Other Ambulatory Visit (HOSPITAL_COMMUNITY): Payer: Self-pay

## 2021-05-08 ENCOUNTER — Other Ambulatory Visit (HOSPITAL_COMMUNITY): Payer: Self-pay

## 2021-05-08 MED FILL — Spironolactone Tab 25 MG: ORAL | 30 days supply | Qty: 30 | Fill #9 | Status: CN

## 2021-05-14 ENCOUNTER — Institutional Professional Consult (permissible substitution): Payer: 59 | Admitting: Neurology

## 2021-05-15 ENCOUNTER — Other Ambulatory Visit (HOSPITAL_COMMUNITY): Payer: Self-pay

## 2021-05-15 MED FILL — Spironolactone Tab 25 MG: ORAL | 30 days supply | Qty: 30 | Fill #9 | Status: AC

## 2021-05-16 ENCOUNTER — Encounter: Payer: Medicaid Other | Admitting: Internal Medicine

## 2021-05-16 ENCOUNTER — Other Ambulatory Visit (HOSPITAL_COMMUNITY): Payer: Self-pay

## 2021-05-28 ENCOUNTER — Encounter: Payer: Medicaid Other | Admitting: Student

## 2021-06-10 MED FILL — Amlodipine Besylate Tab 10 MG (Base Equivalent): ORAL | 90 days supply | Qty: 90 | Fill #3 | Status: AC

## 2021-06-11 ENCOUNTER — Other Ambulatory Visit (HOSPITAL_COMMUNITY): Payer: Self-pay

## 2021-06-11 ENCOUNTER — Encounter: Payer: Self-pay | Admitting: Internal Medicine

## 2021-06-11 ENCOUNTER — Other Ambulatory Visit: Payer: Self-pay

## 2021-06-11 ENCOUNTER — Ambulatory Visit (HOSPITAL_COMMUNITY)
Admission: RE | Admit: 2021-06-11 | Discharge: 2021-06-11 | Disposition: A | Discharge status: Home / Self Care | Payer: Managed Care, Other (non HMO) | Source: Ambulatory Visit | Attending: Internal Medicine | Admitting: Internal Medicine

## 2021-06-11 ENCOUNTER — Ambulatory Visit: Payer: Managed Care, Other (non HMO) | Admitting: Internal Medicine

## 2021-06-11 VITALS — BP 133/87 | HR 72 | Temp 98.5°F | Resp 24 | Ht 62.0 in | Wt 184.2 lb

## 2021-06-11 DIAGNOSIS — R0789 Other chest pain: Secondary | ICD-10-CM | POA: Diagnosis present

## 2021-06-11 DIAGNOSIS — R001 Bradycardia, unspecified: Secondary | ICD-10-CM | POA: Diagnosis not present

## 2021-06-11 DIAGNOSIS — Z124 Encounter for screening for malignant neoplasm of cervix: Secondary | ICD-10-CM

## 2021-06-11 DIAGNOSIS — F17200 Nicotine dependence, unspecified, uncomplicated: Secondary | ICD-10-CM

## 2021-06-11 DIAGNOSIS — R9431 Abnormal electrocardiogram [ECG] [EKG]: Secondary | ICD-10-CM | POA: Diagnosis not present

## 2021-06-11 NOTE — Progress Notes (Signed)
° °  CC: left side pain  HPI:  [name] is a 46 y.o. female with PMHx as stated below presenting with intermittent left sided chest pain radiating to the left back for approximately one week. Improved with ibuprofen and exacerbated with movement. She denies any associated shortness of breath. Please see problem based charting for complete assessment and plan.   Past Medical History:  Diagnosis Date   Abscess of left axilla 12/03/2019   Presents with 4d of painful abscess under L axilla after shaving. No fever or chills. It has not drained. On exam, there is a small area of draining pus surrounded by 1cm area of fluctuance and erythema, surrounded by a larger indurated area. Not significantly tender. No tracking noted. Small amount of pus expressed.   Hepatitis C    HFrEF (heart failure with reduced ejection fraction) ([facility])    Hypertension    Left breast mass 07/22/2019   Review of Systems:  Negative except as stated in HPI  Physical Exam:  Vitals:   06/11/21 1333  BP: 133/87  Pulse: 72  Resp: (!) 24  Temp: 98.5 F (36.9 C)  TempSrc: Oral  SpO2: 100%  Weight: 184 lb 3.2 oz (83.6 kg)  Height: 5\' 2"  (1.575 m)   Physical Exam  Constitutional: Appears well-developed and well-nourished. No distress.  Cardiovascular: Normal rate, regular rhythm, S1 and S2 present, no murmurs, rubs, gallops.  Distal pulses intact Respiratory: Effort is normal.  Lungs are clear to auscultation bilaterally. Musculoskeletal: Normal bulk and tone.  Tenderness to palpation along the left 7-8th intercostal space around the midclavicular line; extending to the midscapular line in the back Neurological: Is alert and oriented x4, no apparent focal deficits noted. Skin: Warm and dry.  No rash, erythema, lesions noted. Psychiatric: Normal mood and affect.   Assessment & Plan:   See Encounters Tab for problem based charting.  Patient discussed with Dr. Heber Olivarez

## 2021-06-11 NOTE — Assessment & Plan Note (Signed)
Patient is presenting for evaluation of left sided chest wall pain for one week duration. This is intermittent in nature that she reports as a throbbing type of pain that is exacerbated with activity such as pulling and pushing and relieved with rest and ibuprofen. She has been taking 4-200mg  tablets of ibuprofen every morning and every evening with improvement in symptoms. On examination, has tenderness to palpation along the 7th-8th intercostal space extending from the mid-clavicular line to the mid-scapula region. No lesions or tenderness to light touch appreciated. Less likely to be shingles.  EKG obtained at this visit without evidence of ACS. Pain is likely musculoskeletal in nature.  Plan: Conservative management with topical NSAIDs and heat therapy advised  Follow up in 2-4 weeks

## 2021-06-11 NOTE — Assessment & Plan Note (Signed)
PAP smear at next visit

## 2021-06-11 NOTE — Patient Instructions (Addendum)
[name],  It was a pleasure seeing you in clinic. Today we discussed:   Left sided pain: Your EKG was reassuring. It seems that this is more musculoskeletal in nature. As we discussed, I recommend using topical Voltaren gel up to 4 times daily instead of taking Ibuprofen. You may also try heat therapy to the area.   Tobacco use: I encourage you for smoking cessation. I'm glad you have a quit date set. We will follow this up at your next visit.  Follow up in 2 weeks for: - Pain follow up - Tobacco use follow up - Pap smear    If you have any questions or concerns, please call our clinic at [phone] between 9am-5pm and after hours call [phone] and ask for the internal medicine resident on call. If you feel you are having a medical emergency please call 911.   Thank you, we look forward to helping you remain healthy!

## 2021-06-11 NOTE — Assessment & Plan Note (Signed)
Patient reports smoking approximately 8-10 cigarettes daily. She has been contemplating smoking cessation for some time now but has now set a quit date of next week. She reports having prescription of Chantix that she will start taking.   Plan: Encouraged smoking cessation Will follow up on smoking cessation and tolerance of Chantix at next visit

## 2021-06-12 ENCOUNTER — Encounter: Payer: Self-pay | Admitting: Internal Medicine

## 2021-06-12 NOTE — Telephone Encounter (Signed)
Letter to patient placed in chart under communications.

## 2021-06-13 NOTE — Progress Notes (Signed)
Internal Medicine Clinic Attending ° °Case discussed with Dr. Aslam  At the time of the visit.  We reviewed the resident’s history and exam and pertinent patient test results.  I agree with the assessment, diagnosis, and plan of care documented in the resident’s note.  °

## 2021-06-18 MED FILL — Spironolactone Tab 25 MG: ORAL | 30 days supply | Qty: 30 | Fill #10 | Status: AC

## 2021-06-19 ENCOUNTER — Other Ambulatory Visit (HOSPITAL_COMMUNITY): Payer: Self-pay

## 2021-06-25 ENCOUNTER — Ambulatory Visit (INDEPENDENT_AMBULATORY_CARE_PROVIDER_SITE_OTHER): Payer: Managed Care, Other (non HMO) | Admitting: Internal Medicine

## 2021-06-25 ENCOUNTER — Other Ambulatory Visit (HOSPITAL_COMMUNITY)
Admission: RE | Admit: 2021-06-25 | Discharge: 2021-06-25 | Disposition: A | Discharge status: Home / Self Care | Payer: Managed Care, Other (non HMO) | Source: Ambulatory Visit | Attending: Internal Medicine | Admitting: Internal Medicine

## 2021-06-25 VITALS — BP 114/80 | HR 67 | Temp 98.4°F | Wt 183.5 lb

## 2021-06-25 DIAGNOSIS — N393 Stress incontinence (female) (male): Secondary | ICD-10-CM

## 2021-06-25 DIAGNOSIS — Z124 Encounter for screening for malignant neoplasm of cervix: Secondary | ICD-10-CM | POA: Insufficient documentation

## 2021-06-25 NOTE — Progress Notes (Signed)
° °  CC: pap smear, stress incontinence   HPI:  [name] is a 46 y.o. female with PMHx as stated below presenting for evaluation of urinary incontinence and pap smear. Please see problem based charting for complete assessment and plan.   Past Medical History:  Diagnosis Date   Abscess of left axilla 12/03/2019   Presents with 4d of painful abscess under L axilla after shaving. No fever or chills. It has not drained. On exam, there is a small area of draining pus surrounded by 1cm area of fluctuance and erythema, surrounded by a larger indurated area. Not significantly tender. No tracking noted. Small amount of pus expressed.   Hepatitis C    HFrEF (heart failure with reduced ejection fraction) ([place])    Hypertension    Left breast mass 07/22/2019   Review of Systems:  Negative except as stated in HPI.  Physical Exam:  Vitals:   06/25/21 0939  BP: 114/80  Pulse: 67  Temp: 98.4 F (36.9 C)  TempSrc: Oral  SpO2: 100%  Weight: 183 lb 8 oz (83.2 kg)   Physical Exam  Constitutional: Appears well-developed and well-nourished. No distress.  GI: Nondistended, soft, nontender  GU: normal external genitalia without lesions; vaginal walls well rugated with white discharge; cervix without lesions or discharge noted  Musculoskeletal: Normal bulk and tone.   Skin: Warm and dry.  No rash, erythema, lesions noted. Psychiatric: Normal mood and affect.   Assessment & Plan:   See Encounters Tab for problem based charting.  Patient discussed with Dr. Dareen Piano

## 2021-06-25 NOTE — Patient Instructions (Addendum)
Ms [name],  It was a pleasure seeing you in clinic. Today we discussed:   Stress incontinence:  I am obtaining a urine sample to check for any UTI. Attached are some pelvic floor exercises that you can do at home. I can also send referral for physical therapy. Please let us know if your symptoms persist despite this.   Pap smear performed at this visit. I will call you if any abnormal results   If you have any questions or concerns, please call our clinic at [phone] between 9am-5pm and after hours call [phone] and ask for the internal medicine resident on call. If you feel you are having a medical emergency please call 911.   Thank you, we look forward to helping you remain healthy!

## 2021-06-25 NOTE — Assessment & Plan Note (Addendum)
Pap smear performed at this visit. Patient also expressed that she has been having some odorless white vaginal discharge. Vaginal canal with some white discharge noted; no cervical discharge or lesions appreciated on exam.  Plan: Pap cytology Cervicovaginal ancillary testing    ADDENDUM: BV positive - discussed with patient. Will send course of flagyl 500mg  bid for 7 days

## 2021-06-25 NOTE — Assessment & Plan Note (Signed)
Patient endorses several months of urinary incontinence. She notes that this is mostly when she is either gagging, coughing, or most recently, vomiting. She notes a slight leak of urine. Outside of this, patient reports complete voiding and denies any feelings of incomplete voids. She denies any other urinary symptoms. She has had six vaginal births previously. Suspect she has stress incontinence.   Plan: Urinalysis Advised for pelvic floor exercises Offered pelvic floor PT; patient would like to hold off at this time If no improvement with pelvic floor exercises, will refer to PT; may need urogynecologic referral if persistent symptoms for sling

## 2021-06-26 LAB — URINALYSIS, ROUTINE W REFLEX MICROSCOPIC
Bilirubin, UA: NEGATIVE
Glucose, UA: NEGATIVE
Ketones, UA: NEGATIVE
Leukocytes,UA: NEGATIVE
Nitrite, UA: NEGATIVE
Protein,UA: NEGATIVE
RBC, UA: NEGATIVE
Specific Gravity, UA: 1.011 (ref 1.005–1.030)
Urobilinogen, Ur: 0.2 mg/dL (ref 0.2–1.0)
pH, UA: 6 (ref 5.0–7.5)

## 2021-06-27 LAB — CERVICOVAGINAL ANCILLARY ONLY
Bacterial Vaginitis (gardnerella): POSITIVE — AB
Candida Glabrata: NEGATIVE
Candida Vaginitis: NEGATIVE
Chlamydia: NEGATIVE
Comment: NEGATIVE
Comment: NEGATIVE
Comment: NEGATIVE
Comment: NEGATIVE
Comment: NEGATIVE
Comment: NORMAL
Neisseria Gonorrhea: NEGATIVE
Trichomonas: NEGATIVE

## 2021-06-27 LAB — CYTOLOGY - PAP
Comment: NEGATIVE
Diagnosis: NEGATIVE
High risk HPV: NEGATIVE

## 2021-06-28 ENCOUNTER — Other Ambulatory Visit (HOSPITAL_COMMUNITY): Payer: Self-pay

## 2021-06-28 MED ORDER — METRONIDAZOLE 500 MG PO TABS
500.0000 mg | ORAL_TABLET | Freq: Two times a day (BID) | ORAL | 0 refills | Status: AC
  Filled 2021-06-28: qty 14, 7d supply, fill #0

## 2021-06-28 NOTE — Addendum Note (Signed)
Addended by: Harvie Heck on: 06/28/2021 12:40 PM   Modules accepted: Orders

## 2021-07-10 ENCOUNTER — Institutional Professional Consult (permissible substitution): Payer: Self-pay | Admitting: Neurology

## 2021-07-11 ENCOUNTER — Other Ambulatory Visit (HOSPITAL_COMMUNITY): Payer: Self-pay

## 2021-07-21 ENCOUNTER — Other Ambulatory Visit: Payer: Self-pay

## 2021-07-21 ENCOUNTER — Other Ambulatory Visit: Payer: Self-pay | Admitting: Internal Medicine

## 2021-07-21 DIAGNOSIS — I10 Essential (primary) hypertension: Secondary | ICD-10-CM

## 2021-07-23 ENCOUNTER — Other Ambulatory Visit (HOSPITAL_COMMUNITY): Payer: Self-pay

## 2021-07-23 ENCOUNTER — Other Ambulatory Visit: Payer: Self-pay | Admitting: Internal Medicine

## 2021-07-23 DIAGNOSIS — I10 Essential (primary) hypertension: Secondary | ICD-10-CM

## 2021-07-24 ENCOUNTER — Encounter: Payer: Managed Care, Other (non HMO) | Admitting: Student

## 2021-07-24 ENCOUNTER — Encounter: Payer: Self-pay | Admitting: Internal Medicine

## 2021-07-24 ENCOUNTER — Other Ambulatory Visit (HOSPITAL_COMMUNITY): Payer: Self-pay

## 2021-07-24 DIAGNOSIS — I10 Essential (primary) hypertension: Secondary | ICD-10-CM

## 2021-07-26 ENCOUNTER — Other Ambulatory Visit: Payer: Self-pay | Admitting: Internal Medicine

## 2021-07-26 ENCOUNTER — Other Ambulatory Visit (HOSPITAL_COMMUNITY): Payer: Self-pay

## 2021-07-26 ENCOUNTER — Other Ambulatory Visit: Payer: Self-pay

## 2021-07-26 DIAGNOSIS — I10 Essential (primary) hypertension: Secondary | ICD-10-CM

## 2021-07-26 MED ORDER — LOSARTAN POTASSIUM-HCTZ 100-25 MG PO TABS
1.0000 | ORAL_TABLET | Freq: Every day | ORAL | 1 refills | Status: DC
  Filled 2021-07-26: qty 90, 90d supply, fill #0

## 2021-07-30 ENCOUNTER — Other Ambulatory Visit: Payer: Self-pay | Admitting: Student

## 2021-07-30 ENCOUNTER — Other Ambulatory Visit (HOSPITAL_COMMUNITY): Payer: Self-pay

## 2021-07-30 DIAGNOSIS — I10 Essential (primary) hypertension: Secondary | ICD-10-CM

## 2021-07-30 MED ORDER — LOSARTAN POTASSIUM-HCTZ 100-25 MG PO TABS
1.0000 | ORAL_TABLET | Freq: Every day | ORAL | 1 refills | Status: DC
  Filled 2021-07-30: qty 90, 90d supply, fill #0

## 2021-07-30 MED ORDER — SPIRONOLACTONE 25 MG PO TABS
25.0000 mg | ORAL_TABLET | Freq: Every day | ORAL | 11 refills | Status: DC
  Filled 2021-07-30: qty 30, 30d supply, fill #0

## 2021-07-30 MED ORDER — AMLODIPINE BESYLATE 10 MG PO TABS
10.0000 mg | ORAL_TABLET | Freq: Every day | ORAL | 3 refills | Status: DC
Start: 2021-07-30 — End: 2021-08-15
  Filled 2021-07-30: qty 90, fill #0

## 2021-07-31 ENCOUNTER — Encounter: Payer: Self-pay | Admitting: Internal Medicine

## 2021-07-31 ENCOUNTER — Other Ambulatory Visit: Payer: Self-pay

## 2021-07-31 ENCOUNTER — Other Ambulatory Visit (HOSPITAL_COMMUNITY): Payer: Self-pay

## 2021-07-31 ENCOUNTER — Ambulatory Visit (INDEPENDENT_AMBULATORY_CARE_PROVIDER_SITE_OTHER): Payer: Managed Care, Other (non HMO) | Admitting: Internal Medicine

## 2021-07-31 VITALS — BP 127/83 | HR 64 | Temp 98.0°F | Ht 62.0 in | Wt 182.6 lb

## 2021-07-31 DIAGNOSIS — Z789 Other specified health status: Secondary | ICD-10-CM

## 2021-07-31 DIAGNOSIS — F17200 Nicotine dependence, unspecified, uncomplicated: Secondary | ICD-10-CM

## 2021-07-31 DIAGNOSIS — R399 Unspecified symptoms and signs involving the genitourinary system: Secondary | ICD-10-CM

## 2021-07-31 DIAGNOSIS — I10 Essential (primary) hypertension: Secondary | ICD-10-CM | POA: Diagnosis not present

## 2021-07-31 NOTE — Patient Instructions (Addendum)
Ms. [name], ? ?It was a pleasure seeing you in clinic. Today we discussed:  ? ?We will check your urine for infection and call you with the results. ?Your blood pressure looks great! Keep taking your medications the way you have been. ?You are doing so well cutting back on smoking and alcohol! If there is anything we can do to support you or if you are interested in trying a different medication from Chantix, please let us know! ? ? ?If you have any questions or concerns, please call our clinic at [phone] between 9am-5pm and after hours call [phone] and ask for the internal medicine resident on call. If you feel you are having a medical emergency please call 911.  ? ?Thank you, we look forward to helping you remain healthy! ? ? ? ?

## 2021-07-31 NOTE — Assessment & Plan Note (Addendum)
Patient says she has reduced alcohol intake. She is now drinking 1 or 2 beers daily. She is smoking less because she is drinking less,and she used to do both together. She has had withdrawals previously when reducing alcohol intake but reports she was angry and had headaches. She denied seizures. She believes reducing intake is going much better this time compared to previous attempts and believes this is because she truly wants to quit. Discussed possibility of gauging patient interest in Naltrexone at next visit. Reminded her that we are able to support her with her efforts if she determines she needs it.  ?

## 2021-07-31 NOTE — Assessment & Plan Note (Addendum)
Patient was recently prescribed Metronidazole on 06/25/2021 for bacterial vaginosis. She says when she finished the 7-day course, her urine was cloudy and dark. It returned to normal after 2 days but then became dark again and has remained dark since. She believes she may have a UTI after taking the antibiotic. She says she drinks a lot of water and is not dehydrated. She endorses tingling, urinary frequency, urgency with a small amount of urinary production, and denies burning, pain, hesitancy, or blood. She has not been sexually active in the past week. She says her period started on 07/22/2021 and ended 07/29/2021, and it was normal.  ? ?On exam, there was no suprapubic tenderness. Less likely a UTI given lack of pain with urination or discomfort.  A common side effect of Metronidazole is urinary color change. She is most likely experiencing a medication side effect, but ordered UA to rule out UTI. ? ?Plan ?- Follow up UA and urine culture ? ? ?ADDENDUM: ?UA with bacteruria, leukocytes and nitrites. Urine culture positive for E.coli >100K colonies.  ?Patient continues to endorse urinary symptoms. Will treat Keflex '500mg'$  q6h x5days. Discussed with patient. She is to follow up if no improvement in symptoms.  ?

## 2021-07-31 NOTE — Assessment & Plan Note (Addendum)
Patient says she has reduced her tobacco smoking from 1 pack per day to 6 cigarettes per day. She was prescribed Chantix previously but was afraid to take it after being told it could cause vivid dreams. She believes she has the willpower to quit. Since reducing her drinking, she has also reduced her smoking because she used to do both together. Discussed other medical options with patient besides Chantix that could help with cessation. In the future, may discuss Bupropion because patient has no history of seizures, including withdrawal seizures, or eating disorders. Reminded her that we are able to support her with her efforts if she determines she needs it.  ? ?

## 2021-07-31 NOTE — Assessment & Plan Note (Addendum)
Patient reports good adherence to current regimen. BP today 127/83. She takes Amlodipine in the morning and takes Hyzaar, Spironolactone at night. She takes Carvedilol in the morning and at night. She denies any side effects other than drowsiness with Amlodipine. She has no difficulty taking or affording her medications and believes the regimen is working well. Encouraged to continue reducing alcohol consumption and stop smoking because this will help her blood pressure. No medication changes at this time. ? ?Plan ?- Continue Amlodipine '10mg'$  in the morning, Hyzaar 100-'25mg'$  nightly, Spironolactone '25mg'$  nightly, and Carvedilol '25mg'$  each morning and night  ?

## 2021-07-31 NOTE — Progress Notes (Signed)
? ?This is a Careers information officer Note.  The care of the patient was discussed with Dr. Harvie Heck, MD and the assessment and plan was formulated with their assistance.  Please see their note for official documentation of the patient encounter.  ? ?Subjective:  ? ?Patient ID: [name] female   DOB: 1975/07/23 46 y.o.   MRN: [id number] ? ?HPI: ?[name] is a 46 y.o. with PMHx of HFrEF, HTN, Hep C who presents with concern for UTI symptoms. Please see problem based charting for complete assessment and plan.  ? ? ?Past Medical History:  ?Diagnosis Date  ? Abscess of left axilla 12/03/2019  ? Presents with 4d of painful abscess under L axilla after shaving. No fever or chills. It has not drained. On exam, there is a small area of draining pus surrounded by 1cm area of fluctuance and erythema, surrounded by a larger indurated area. Not significantly tender. No tracking noted. Small amount of pus expressed.  ? Hepatitis C   ? HFrEF (heart failure with reduced ejection fraction) ([place])   ? Hypertension   ? Left breast mass 07/22/2019  ? ?Current Outpatient Medications  ?Medication Sig Dispense Refill  ? amLODipine (NORVASC) 10 MG tablet Take 1 tablet (10 mg total) by mouth daily. 90 tablet 3  ? carvedilol (COREG) 25 MG tablet Take 1 tablet (25 mg total) by mouth 2 (two) times daily. 180 tablet 3  ? losartan-hydrochlorothiazide (HYZAAR) 100-25 MG tablet Take 1 tablet by mouth daily. 90 tablet 1  ? losartan-hydrochlorothiazide (HYZAAR) 100-25 MG tablet Take 1 tablet by mouth daily. 90 tablet 1  ? spironolactone (ALDACTONE) 25 MG tablet Take 1 tablet (25 mg total) by mouth daily. 30 tablet 11  ? varenicline (CHANTIX STARTING MONTH PAK) 0.5 MG X 11 & 1 MG X 42 tablet Take one 0.5 mg tablet by mouth once daily for 3 days, then increase to one 0.5 mg tablet twice daily for 4 days, then increase to one 1 mg tablet twice daily. 53 tablet 0  ? ?No current facility-administered medications for this visit.  ? ?Family  History  ?Problem Relation Age of Onset  ? Hypertension Mother   ? Hyperlipidemia Mother   ? Diabetes Mother   ? Hepatitis C Mother   ? Diabetes Maternal Aunt   ? ?Social History  ? ?Socioeconomic History  ? Marital status: Single  ?  Spouse name: Not on file  ? Number of children: Not on file  ? Years of education: Not on file  ? Highest education level: Not on file  ?Occupational History  ? Not on file  ?Tobacco Use  ? Smoking status: Some Days  ?  Packs/day: 0.50  ?  Types: Cigarettes  ? Smokeless tobacco: Never  ? Tobacco comments:  ?  6-7 per day ./  has Vaped   ?Substance and Sexual Activity  ? Alcohol use: Yes  ?  Alcohol/week: 28.0 standard drinks  ?  Types: 28 Standard drinks or equivalent per week  ? Drug use: Yes  ?  Frequency: 0.5 times per week  ?  Types: Marijuana  ? Sexual activity: Not on file  ?Other Topics Concern  ? Not on file  ?Social History Narrative  ? Not on file  ? ?Social Determinants of Health  ? ?Financial Resource Strain: Not on file  ?Food Insecurity: Not on file  ?Transportation Needs: Not on file  ?Physical Activity: Not on file  ?Stress: Not on file  ?Social Connections: Not  on file  ? ?Review of Systems: ?Pertinent items are noted in HPI. ?Objective:  ?Physical Exam: ?Vitals:  ? 07/31/21 1045  ?BP: 127/83  ?Pulse: 64  ?Temp: 98 ?F (36.7 ?C)  ?TempSrc: Oral  ?SpO2: 100%  ?Weight: 182 lb 9.6 oz (82.8 kg)  ?Height: '5\' 2"'$  (1.575 m)  ? ?BP 127/83 (BP Location: Right Arm, Patient Position: Sitting, Cuff Size: Normal)   Pulse 64   Temp 98 ?F (36.7 ?C) (Oral)   Ht '5\' 2"'$  (1.575 m)   Wt 182 lb 9.6 oz (82.8 kg)   SpO2 100%   BMI 33.40 kg/m?  ? ?General Appearance:    Alert, cooperative, no distress  ?Head:    Normocephalic, atraumatic  ?Lungs:     Clear to auscultation bilaterally, respirations unlabored  ? Heart:    Regular rate and rhythm, S1 and S2 normal, no murmur, rub   or gallop  ?Breast Exam:    No tenderness, masses, or nipple abnormality  ?Abdomen:     Soft, non-tender, bowel  sounds active all four quadrants,  ?  no suprapubic tenderness  ?Extremities:   Extremities normal, atraumatic, no edema  ? ?Assessment & Plan:  ? ?See encounters tab for problem-based charting.  ? ?Essential hypertension ?Patient reports good adherence to current regimen. BP today 127/83. She takes Amlodipine in the morning and takes Hyzaar, Spironolactone at night. She takes Carvedilol in the morning and at night. She denies any side effects other than drowsiness with Amlodipine. She has no difficulty taking or affording her medications and believes the regimen is working well. Encouraged to continue reducing alcohol consumption and stop smoking because this will help her blood pressure. No medication changes at this time. ? ?Plan ?- Continue Amlodipine '10mg'$  in the morning, Hyzaar 100-'25mg'$  nightly, Spironolactone '25mg'$  nightly, and Carvedilol '25mg'$  each morning and night  ? ?Abnormal urine color ?Patient was recently prescribed Metronidazole on 06/25/2021 for bacterial vaginosis. She says when she finished the 7-day course, her urine was cloudy and dark. It returned to normal after 2 days but then became dark again and has remained dark since. She believes she may have a UTI after taking the antibiotic. She says she drinks a lot of water and is not dehydrated. She endorses tingling, urinary frequency, urgency with a small amount of urinary production, and denies burning, pain, hesitancy, or blood. She has not been sexually active in the past week. She says her period started on 07/22/2021 and ended 07/29/2021.  ? ?On exam, there was no suprapubic tenderness. Less likely a UTI given lack of pain with urination or discomfort.  A common side effect of Metronidazole is urinary color change. She is most likely experiencing a medication side effect, but ordered UA to rule out UTI. ? ?Plan ?- Follow up UA and urine culture ? ?Alcohol use ?Patient says she has reduced alcohol intake. She is now drinking 1 or 2 beers daily.  She is smoking less because she is drinking less,and she used to do both together. She has had withdrawals previously when reducing alcohol intake but reports she was angry and had headaches. She denied seizures. She believes reducing intake is going much better this time compared to previous attempts and believes this is because she truly wants to quit. Discussed possibility of gauging patient interest in Naltrexone at next visit. Reminded her that we are able to support her with her efforts if she determines she needs it.  ? ?Tobacco use disorder ?Patient says she has reduced  her tobacco smoking from 1 pack per day to 6 cigarettes per day. She was prescribed Chantix previously but was afraid to take it after being told it could cause vivid dreams. She believes she has the willpower to quit. Since reducing her drinking, she has also reduced her smoking because she used to do both together. Discussed other medical options with patient besides Chantix that could help with cessation. In the future, may discuss Bupropion because patient has no history of seizures, including withdrawal seizures, or eating disorders. Reminded her that we are able to support her with her efforts if she determines she needs it.  ? ? ? ? ?

## 2021-08-01 LAB — MICROSCOPIC EXAMINATION
Casts: NONE SEEN /lpf
Epithelial Cells (non renal): >10 /hpf — AB (ref 0–10)
WBC, UA: >30 /hpf — AB (ref 0–5)

## 2021-08-01 LAB — URINALYSIS, ROUTINE W REFLEX MICROSCOPIC
Bilirubin, UA: NEGATIVE
Glucose, UA: NEGATIVE
Ketones, UA: NEGATIVE
Nitrite, UA: POSITIVE — AB
Protein,UA: NEGATIVE
RBC, UA: NEGATIVE
Specific Gravity, UA: 1.02 (ref 1.005–1.030)
Urobilinogen, Ur: 0.2 mg/dL (ref 0.2–1.0)
pH, UA: 5.5 (ref 5.0–7.5)

## 2021-08-02 ENCOUNTER — Other Ambulatory Visit (HOSPITAL_COMMUNITY): Payer: Self-pay

## 2021-08-02 ENCOUNTER — Telehealth: Payer: Self-pay | Admitting: Internal Medicine

## 2021-08-02 LAB — URINE CULTURE

## 2021-08-02 MED ORDER — CEPHALEXIN 500 MG PO CAPS
500.0000 mg | ORAL_CAPSULE | Freq: Four times a day (QID) | ORAL | 0 refills | Status: AC
  Filled 2021-08-02: qty 20, 5d supply, fill #0

## 2021-08-02 NOTE — Addendum Note (Signed)
Addended by: Harvie Heck on: 08/02/2021 08:45 AM ? ? Modules accepted: Orders ? ?

## 2021-08-06 NOTE — Progress Notes (Signed)
Internal Medicine Clinic Attending ° °Case discussed with Dr. Aslam  At the time of the visit.  We reviewed the resident’s history and exam and pertinent patient test results.  I agree with the assessment, diagnosis, and plan of care documented in the resident’s note.  °

## 2021-08-07 ENCOUNTER — Other Ambulatory Visit: Payer: Self-pay

## 2021-08-07 ENCOUNTER — Ambulatory Visit (INDEPENDENT_AMBULATORY_CARE_PROVIDER_SITE_OTHER): Payer: Managed Care, Other (non HMO) | Admitting: Internal Medicine

## 2021-08-07 ENCOUNTER — Encounter: Payer: Self-pay | Admitting: Internal Medicine

## 2021-08-07 VITALS — BP 131/88 | HR 98 | Temp 98.2°F | Ht 62.0 in | Wt 185.5 lb

## 2021-08-07 DIAGNOSIS — R399 Unspecified symptoms and signs involving the genitourinary system: Secondary | ICD-10-CM | POA: Diagnosis not present

## 2021-08-07 DIAGNOSIS — M79662 Pain in left lower leg: Secondary | ICD-10-CM | POA: Diagnosis not present

## 2021-08-07 DIAGNOSIS — M79605 Pain in left leg: Secondary | ICD-10-CM

## 2021-08-07 DIAGNOSIS — R2242 Localized swelling, mass and lump, left lower limb: Secondary | ICD-10-CM | POA: Diagnosis not present

## 2021-08-07 LAB — D-DIMER, QUANTITATIVE: D-Dimer, Quant: <0.27 ug/mL-FEU (ref 0.00–0.50)

## 2021-08-07 NOTE — Patient Instructions (Addendum)
It was very nice to see you in clinic today. Here are a few reminders from what we discussed: ? ?We are ordering a test called a D-dimer. This will help [place] determine that there is not a clot in your left leg causing this swelling. ?We recommend you try compression stockings during your long days at work to see if this helps reduce the swelling. After work, continue elevating your legs and also consider using ice. ?We will refer you to sports medicine, and they should be able to order imaging and further testing if necessary. ?We are glad your urinary symptoms are gone! We recommend you continue taking the antibiotics as prescribed for the full 5 days. ?When we last saw you, you were doing well cutting back on smoking and alcohol! If there is anything we can do to support you or if you are interested in trying a different medication from Chantix, please let us know! ? ?If you have any questions or concerns, please contact us! ?

## 2021-08-07 NOTE — Assessment & Plan Note (Signed)
Patient has had left leg swelling from her medial foot to her lower calc muscle for the past 2 years. 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 She attempted to change the type of socks she wore, and this did not help. She has never tried compression stockings, and she denied tight-fitting work shoes. She elevates her legs when she gets home, and swelling goes away. She denies numbness, tingling, aching in her legs. She never has any swelling or symptoms in the right leg. On exam, minimal LLE edema noted, but patient says swelling only occurs after long days at work. Given unilateral leg swelling with hx of smoking, ordered a D-Dimer.   ? ?Plan ?- Follow D-Dimer results ?     - If low, less likely a DVT ?     - If high, pursue [place] w/ doppler ?- Recommended she continue leg elevation and try using ice and compression stockings ?

## 2021-08-07 NOTE — Assessment & Plan Note (Addendum)
Patient says she has 4/10 pain in L calf. She says a month and a half ago, she was stepping into her friend's truck, which is high off the ground, when she heard a loud pop that was followed by calf stiffening and pain. She said she could not bear weight for 5 days. She did not take any medications for the pain, but the pain has reduced over time. She denies joint instability. She denies pain with walking. She most likely strained her calf muscle. Less likely a tear because the pain has mostly resolved. Recommended that she follow-up with sports medicine.  ? ?- Referral to sports medicine placed ?

## 2021-08-07 NOTE — Progress Notes (Signed)
Attestation for Student Documentation: ? ?I personally was present and performed or re-performed the history, physical exam and medical decision-making activities of this service and have verified that the service and findings are accurately documented in the student?s note. ? ?Patient is presenting for evaluation for chronic left lower extremity swelling and subacute left lower extremity pain following a "popping" sound when she attempted to get into a truck 1.5 months ago. Physical exam consistent with possible calf muscle sprain. D-dimer obtained to r/o DVT, negative. Patient advised for RICE and referral to sports medicine placed.  ?She reports improvement of her urinary symptoms from last office visit. Advised to complete course of Keflex.  ? Harvie Heck, MD ?[facility] PGY-3 ?08/07/2021, 3:04 PM ? ?

## 2021-08-07 NOTE — Progress Notes (Signed)
? ?This is a Careers information officer Note.  The care of the patient was discussed with Dr. Harvie Heck, MD and the assessment and plan was formulated with their assistance.  Please see their note for official documentation of the patient encounter.  ? ?Subjective:  ? ?Patient ID: [name] female   DOB: November 27, 1975 46 y.o.   MRN: [id number] ? ?HPI: ?[name] is a 46 y.o. with PMHx of HFrEF, HTN, Hepatitis C who presents with LLE pain and swelling. Please see problem-based charting for complete assessment and plan.  ? ?Past Medical History:  ?Diagnosis Date  ? Abscess of left axilla 12/03/2019  ? Presents with 4d of painful abscess under L axilla after shaving. No fever or chills. It has not drained. On exam, there is a small area of draining pus surrounded by 1cm area of fluctuance and erythema, surrounded by a larger indurated area. Not significantly tender. No tracking noted. Small amount of pus expressed.  ? Hepatitis C   ? HFrEF (heart failure with reduced ejection fraction) ([place])   ? Hypertension   ? Left breast mass 07/22/2019  ? ?Current Outpatient Medications  ?Medication Sig Dispense Refill  ? amLODipine (NORVASC) 10 MG tablet Take 1 tablet (10 mg total) by mouth daily. 90 tablet 3  ? carvedilol (COREG) 25 MG tablet Take 1 tablet (25 mg total) by mouth 2 (two) times daily. 180 tablet 3  ? cephALEXin (KEFLEX) 500 MG capsule Take 1 capsule (500 mg total) by mouth 4 (four) times daily for 5 days. 20 capsule 0  ? losartan-hydrochlorothiazide (HYZAAR) 100-25 MG tablet Take 1 tablet by mouth daily. 90 tablet 1  ? losartan-hydrochlorothiazide (HYZAAR) 100-25 MG tablet Take 1 tablet by mouth daily. 90 tablet 1  ? spironolactone (ALDACTONE) 25 MG tablet Take 1 tablet (25 mg total) by mouth daily. 30 tablet 11  ? varenicline (CHANTIX STARTING MONTH PAK) 0.5 MG X 11 & 1 MG X 42 tablet Take one 0.5 mg tablet by mouth once daily for 3 days, then increase to one 0.5 mg tablet twice daily for 4 days, then increase to  one 1 mg tablet twice daily. 53 tablet 0  ? ?No current facility-administered medications for this visit.  ? ?Family History  ?Problem Relation Age of Onset  ? Hypertension Mother   ? Hyperlipidemia Mother   ? Diabetes Mother   ? Hepatitis C Mother   ? Diabetes Maternal Aunt   ? ?Social History  ? ?Socioeconomic History  ? Marital status: Single  ?  Spouse name: Not on file  ? Number of children: Not on file  ? Years of education: Not on file  ? Highest education level: Not on file  ?Occupational History  ? Not on file  ?Tobacco Use  ? Smoking status: Some Days  ?  Packs/day: 0.50  ?  Types: Cigarettes  ? Smokeless tobacco: Never  ? Tobacco comments:  ?  6-7 per day ./  has Vaped   ?Substance and Sexual Activity  ? Alcohol use: Yes  ?  Alcohol/week: 28.0 standard drinks  ?  Types: 28 Standard drinks or equivalent per week  ? Drug use: Yes  ?  Frequency: 0.5 times per week  ?  Types: Marijuana  ? Sexual activity: Not on file  ?Other Topics Concern  ? Not on file  ?Social History Narrative  ? Not on file  ? ?Social Determinants of Health  ? ?Financial Resource Strain: Not on file  ?Food Insecurity: Not  on file  ?Transportation Needs: Not on file  ?Physical Activity: Not on file  ?Stress: Not on file  ?Social Connections: Not on file  ? ?Review of Systems: ?Pertinent items are noted in HPI. ?Objective:  ?Physical Exam: ?Vitals:  ? 08/07/21 0936  ?BP: 131/88  ?Pulse: 98  ?Temp: 98.2 ?F (36.8 ?C)  ?TempSrc: Oral  ?SpO2: 98%  ?Weight: 185 lb 8 oz (84.1 kg)  ?Height: '5\' 2"'$  (1.575 m)  ? ?BP 131/88 (BP Location: Right Arm, Patient Position: Sitting, Cuff Size: Normal)   Pulse 98   Temp 98.2 ?F (36.8 ?C) (Oral)   Ht '5\' 2"'$  (1.575 m)   Wt 185 lb 8 oz (84.1 kg)   SpO2 98%   BMI 33.93 kg/m?  ? ?General Appearance:    Alert, cooperative, no distress  ?Head:    Normocephalic, atraumatic  ?Lungs:     Clear to auscultation bilaterally, respirations unlabored  ?Chest Wall:    No tenderness or deformity  ? Heart:    Regular rate  and rhythm, S1 and S2 normal, no murmur, rub  or gallop  ?Extremities:   Posterior L calf muscle slight tenderness to palpation medially, minimal pitting edema in LLE at and above ankle  ? ?Assessment & Plan:  ? ?Please see encounters tab for problem-based charting. ? ?Pain of left calf ?Patient says she has 4/10 pain in L calf. She says a month and a half ago, she was stepping into her friend's truck, which is high off the ground, when she heard a loud pop that was followed by calf stiffening and pain. She said she could not bear weight for 5 days. She did not take any medications for the pain, but the pain has reduced over time. She denies joint instability. She denies pain with walking. She most likely strained her calf muscle. Less likely a tear because the pain has mostly resolved. Recommended that she follow-up with sports medicine.  ? ?- Referral to sports medicine placed ? ?Localized swelling of left lower leg ?Patient has had left leg swelling from her medial foot to her lower calc muscle for the past 2 years. 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 She attempted to change the type of socks she wore, and this did not help. She has never tried compression stockings, and she denied tight-fitting work shoes. She elevates her legs when she gets home, and swelling goes away. She denies numbness, tingling, aching in her legs. She never has any swelling or symptoms in the right leg. On exam, minimal LLE edema noted, but patient says swelling only occurs after long days at work. Given unilateral leg swelling with hx of smoking, ordered a D-Dimer.   ? ?Plan ?- Follow D-Dimer results ?     - If low, less likely a DVT ?     - If high, pursue [place] w/ doppler ?- Recommended she continue leg elevation and try using ice and compression stockings ? ?Lower urinary tract symptoms ?Patient says UTI symptoms have  resolved. No longer experiencing tingling, urgency, frequency, and her urine color has returned to normal. Gave education about E. coli and UTIs because patient had been extremely worried about the source of infection. This morning, she began taking the Keflex she was prescribed at the previous visit. Although she felt her symptoms were resolved before she picked up the antibiotic yesterday, encouraged  her to continue the 5-day course.  ? ?Plan ?- Continue Keflex '500mg'$  capsule qid for 5 days ? ? ?

## 2021-08-07 NOTE — Assessment & Plan Note (Signed)
Patient says UTI symptoms have resolved. No longer experiencing tingling, urgency, frequency, and her urine color has returned to normal. Gave education about E. coli and UTIs because patient had been extremely worried about the source of infection. This morning, she began taking the Keflex she was prescribed at the previous visit. Although she felt her symptoms were resolved before she picked up the antibiotic yesterday, encouraged her to continue the 5-day course.  ? ?Plan ?- Continue Keflex '500mg'$  capsule qid for 5 days ?

## 2021-08-08 NOTE — Progress Notes (Signed)
Internal Medicine Clinic Attending ° °Case discussed with Dr. Aslam  At the time of the visit.  We reviewed the resident’s history and exam and pertinent patient test results.  I agree with the assessment, diagnosis, and plan of care documented in the resident’s note.  °

## 2021-08-10 ENCOUNTER — Other Ambulatory Visit (HOSPITAL_COMMUNITY): Payer: Self-pay

## 2021-08-15 ENCOUNTER — Other Ambulatory Visit: Payer: Self-pay

## 2021-08-15 DIAGNOSIS — I10 Essential (primary) hypertension: Secondary | ICD-10-CM

## 2021-08-15 MED ORDER — SPIRONOLACTONE 25 MG PO TABS: 25.0000 mg | ORAL_TABLET | Freq: Every day | ORAL | 11 refills | Status: DC

## 2021-08-15 MED ORDER — LOSARTAN POTASSIUM-HCTZ 100-25 MG PO TABS: 1.0000 | ORAL_TABLET | Freq: Every day | ORAL | 1 refills | Status: DC

## 2021-08-15 MED ORDER — AMLODIPINE BESYLATE 10 MG PO TABS: 10.0000 mg | ORAL_TABLET | Freq: Every day | ORAL | 3 refills | Status: DC

## 2021-08-15 NOTE — Telephone Encounter (Signed)
Rx #: [id number]  ?amLODipine (NORVASC) 10 MG tablet [[id number]]  ? ? ?Rx #: [id number]  ?losartan-hydrochlorothiazide (HYZAAR) 100-25 MG tablet [[id number]]  ?Rx #: [id number]  ? ? ?spironolactone (ALDACTONE) 25 MG tablet [[id number]]  ?[place], [place] Phone:  [phone]  ?Fax:  [phone]  ?  ? ?

## 2021-08-16 MED ORDER — CARVEDILOL 25 MG PO TABS: 25.0000 mg | ORAL_TABLET | Freq: Two times a day (BID) | ORAL | 3 refills | Status: DC

## 2021-08-20 ENCOUNTER — Encounter: Payer: Self-pay | Admitting: Family Medicine

## 2021-08-20 ENCOUNTER — Ambulatory Visit: Payer: Self-pay

## 2021-08-20 ENCOUNTER — Ambulatory Visit: Payer: Managed Care, Other (non HMO) | Admitting: Family Medicine

## 2021-08-20 VITALS — BP 120/86 | Ht 62.0 in | Wt 185.0 lb

## 2021-08-20 DIAGNOSIS — M79662 Pain in left lower leg: Secondary | ICD-10-CM

## 2021-08-20 NOTE — Progress Notes (Signed)
PCP: Timothy Lasso, MD ? ?Subjective:  ? ?HPI: ?Patient is a 46 y.o. female here for left calf injury. ? ?Patient reports about 5 weeks ago she was stepping up into a truck when she felt a pop in medial left calf. ?No obvious swelling and bruising. ?Had difficulty bearing weight after this for several days. ?Still walking with a little bit of a limp ?Stands and walks a lot at work - bothers her more by end of work day. ?Not currently doing anything to treat this. ? ?Past Medical History:  ?Diagnosis Date  ? Abscess of left axilla 12/03/2019  ? Presents with 4d of painful abscess under L axilla after shaving. No fever or chills. It has not drained. On exam, there is a small area of draining pus surrounded by 1cm area of fluctuance and erythema, surrounded by a larger indurated area. Not significantly tender. No tracking noted. Small amount of pus expressed.  ? Hepatitis C   ? HFrEF (heart failure with reduced ejection fraction) ([employer])   ? Hypertension   ? Left breast mass 07/22/2019  ? ? ?Current Outpatient Medications on File Prior to Visit  ?Medication Sig Dispense Refill  ? amLODipine (NORVASC) 10 MG tablet Take 1 tablet (10 mg total) by mouth daily. 90 tablet 3  ? carvedilol (COREG) 25 MG tablet Take 1 tablet (25 mg total) by mouth 2 (two) times daily. 180 tablet 3  ? losartan-hydrochlorothiazide (HYZAAR) 100-25 MG tablet Take 1 tablet by mouth daily. 90 tablet 1  ? losartan-hydrochlorothiazide (HYZAAR) 100-25 MG tablet Take 1 tablet by mouth daily. 90 tablet 1  ? spironolactone (ALDACTONE) 25 MG tablet Take 1 tablet (25 mg total) by mouth daily. 30 tablet 11  ? varenicline (CHANTIX STARTING MONTH PAK) 0.5 MG X 11 & 1 MG X 42 tablet Take one 0.5 mg tablet by mouth once daily for 3 days, then increase to one 0.5 mg tablet twice daily for 4 days, then increase to one 1 mg tablet twice daily. 53 tablet 0  ? ?No current facility-administered medications on file prior to visit.  ? ? ?Past Surgical History:  ?Procedure  Laterality Date  ? LEFT AND RIGHT HEART CATHETERIZATION WITH CORONARY ANGIOGRAM N/A 06/23/2013  ? Procedure: LEFT AND RIGHT HEART CATHETERIZATION WITH CORONARY ANGIOGRAM;  Surgeon: Birdie Riddle, MD;  Location: [facility];  Service: Cardiovascular;  Laterality: N/A;  ? LIVER BIOPSY    ? LIVER BIOPSY    ? ? ?Allergies  ?Allergen Reactions  ? Lisinopril Cough  ? ? ?BP 120/86   Ht '5\' 2"'$  (1.575 m)   Wt 185 lb (83.9 kg)   BMI 33.84 kg/m?  ? ?   ? View : No data to display.  ?  ?  ?  ? ? ?   ? View : No data to display.  ?  ?  ?  ? ? ?    ?Objective:  ?Physical Exam: ? ?Gen: NAD, comfortable in exam room ? ?Left lower leg/ankle: ?No gross deformity, swelling, ecchymoses ?FROM ankle with mild pain plantarflexion. ?Mild TTP distal medial gastroc.  No other tenderness. ?Negative ant drawer and negative talar tilt.   ?Thompsons test negative. ?NV intact distally. ? ?Limited MSK u/s left lower leg:  Anechoic area distal medial gastroc consistent with partial tear. ? ?Assessment & Plan:  ?1. Left calf strain - confirmed with ultrasound.  Compression sleeve, icing, heel lifts.  Tylenol and/or aleve if needed.  Home rehab exercises provided.  F/u in 1  month. ?

## 2021-08-20 NOTE — Patient Instructions (Signed)
You have a calf strain ?Compression sleeve when at work and when up and walking around. ?Icing for 15 minutes at a time 3-4 times a day as needed. ?Heel lifts either in temporary orthotics or on their own to prevent further strain. ?Tylenol and/or aleve for pain as needed. ?Do home exercises once a day as directed. ?Follow up with me in 1 month. ? ?

## 2021-08-20 NOTE — Telephone Encounter (Signed)
Encounter opened in error

## 2021-09-03 IMAGING — MG MM BREAST LOCALIZATION CLIP
4 series · 4 of 12 positions shown · non-contrast
Comparison: Previous exam(s).

CLINICAL DATA: Evaluate post biopsy marker clip placement following
ultrasound-guided core needle biopsy of a left breast mass.

EXAM:
3D DIAGNOSTIC LEFT MAMMOGRAM POST ULTRASOUND BIOPSY

[L CC synth-2D]
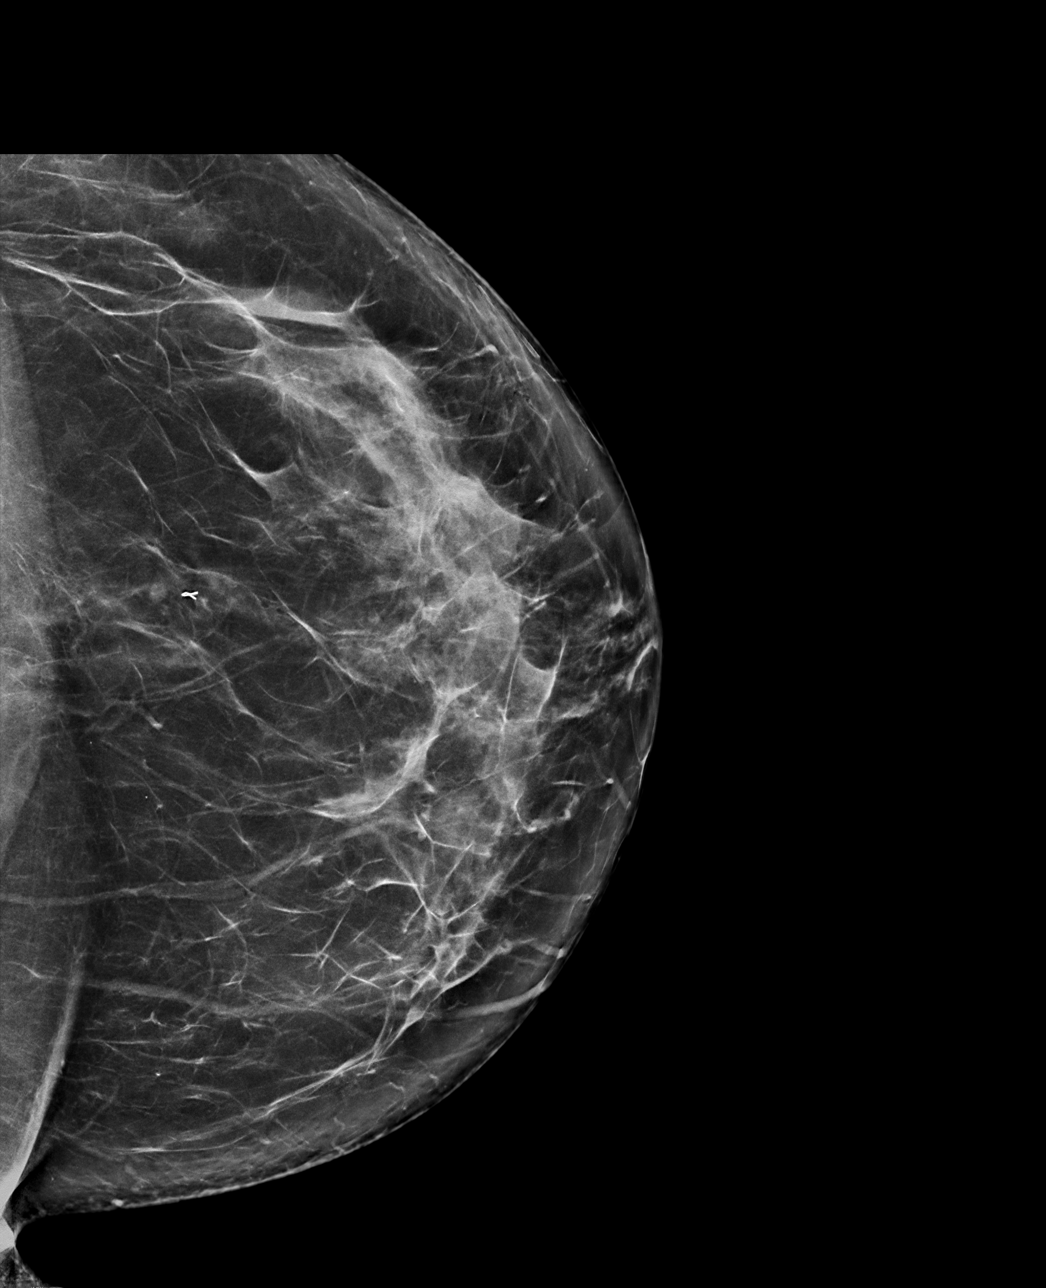

[L ML synth-2D]
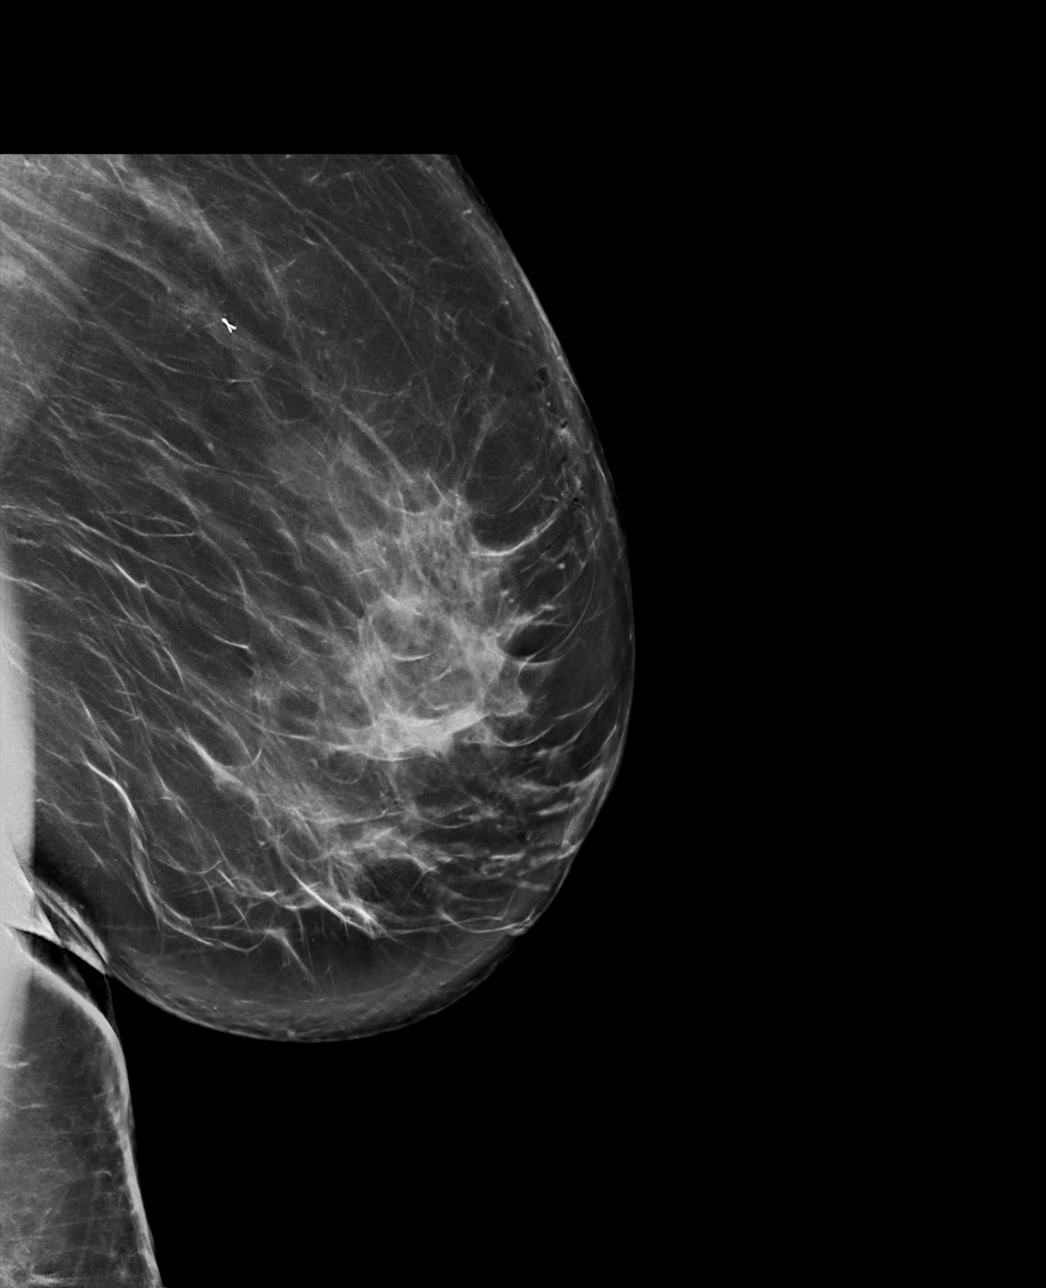

[L ML tomo · tomo slice 49/98.0]
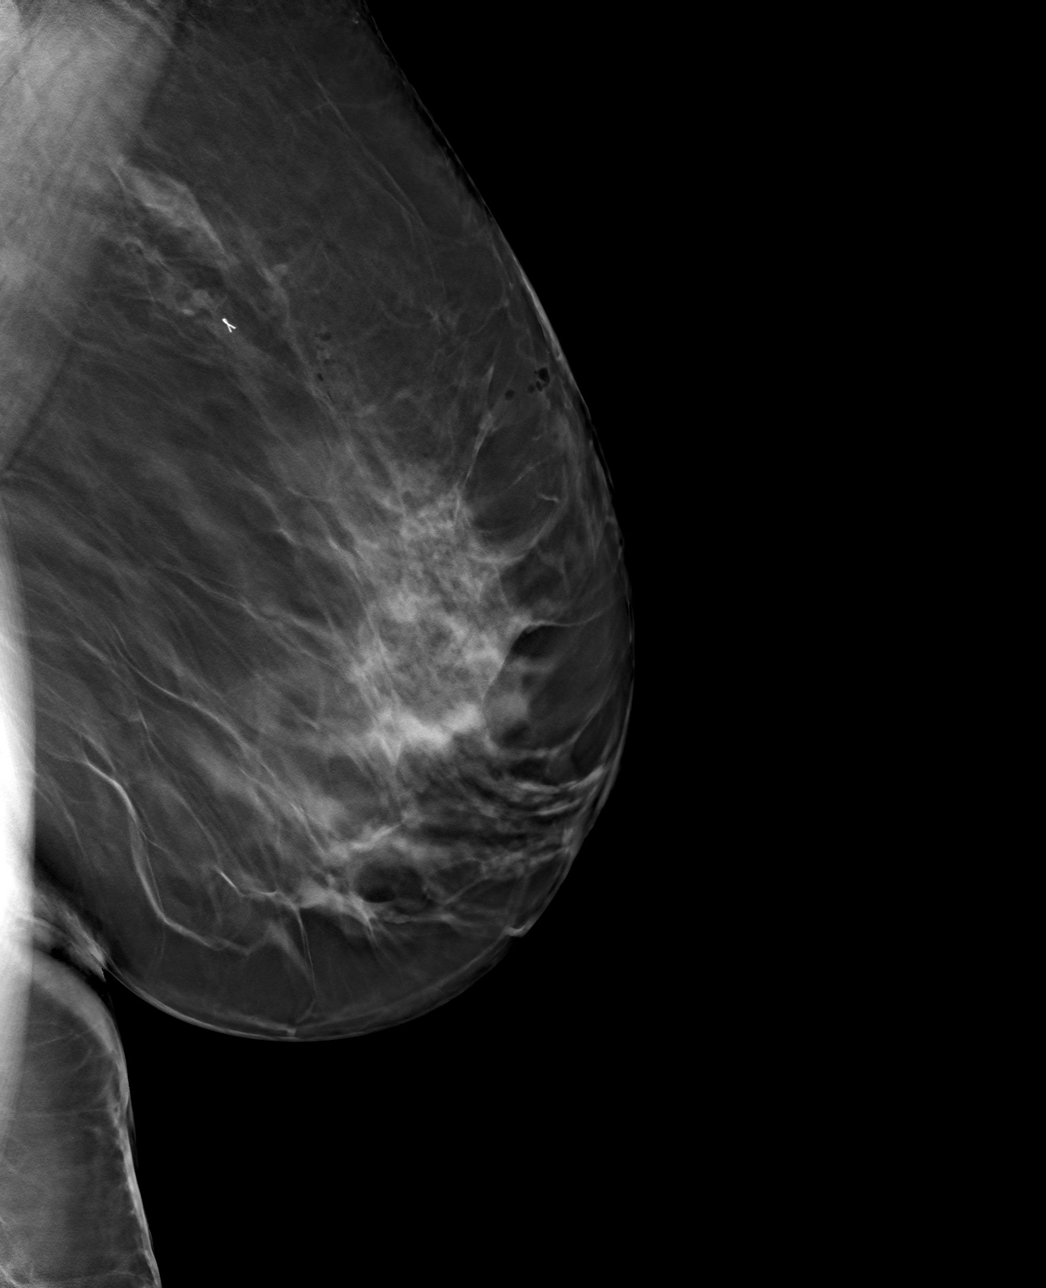

[L CC tomo · tomo slice 45/88.0]
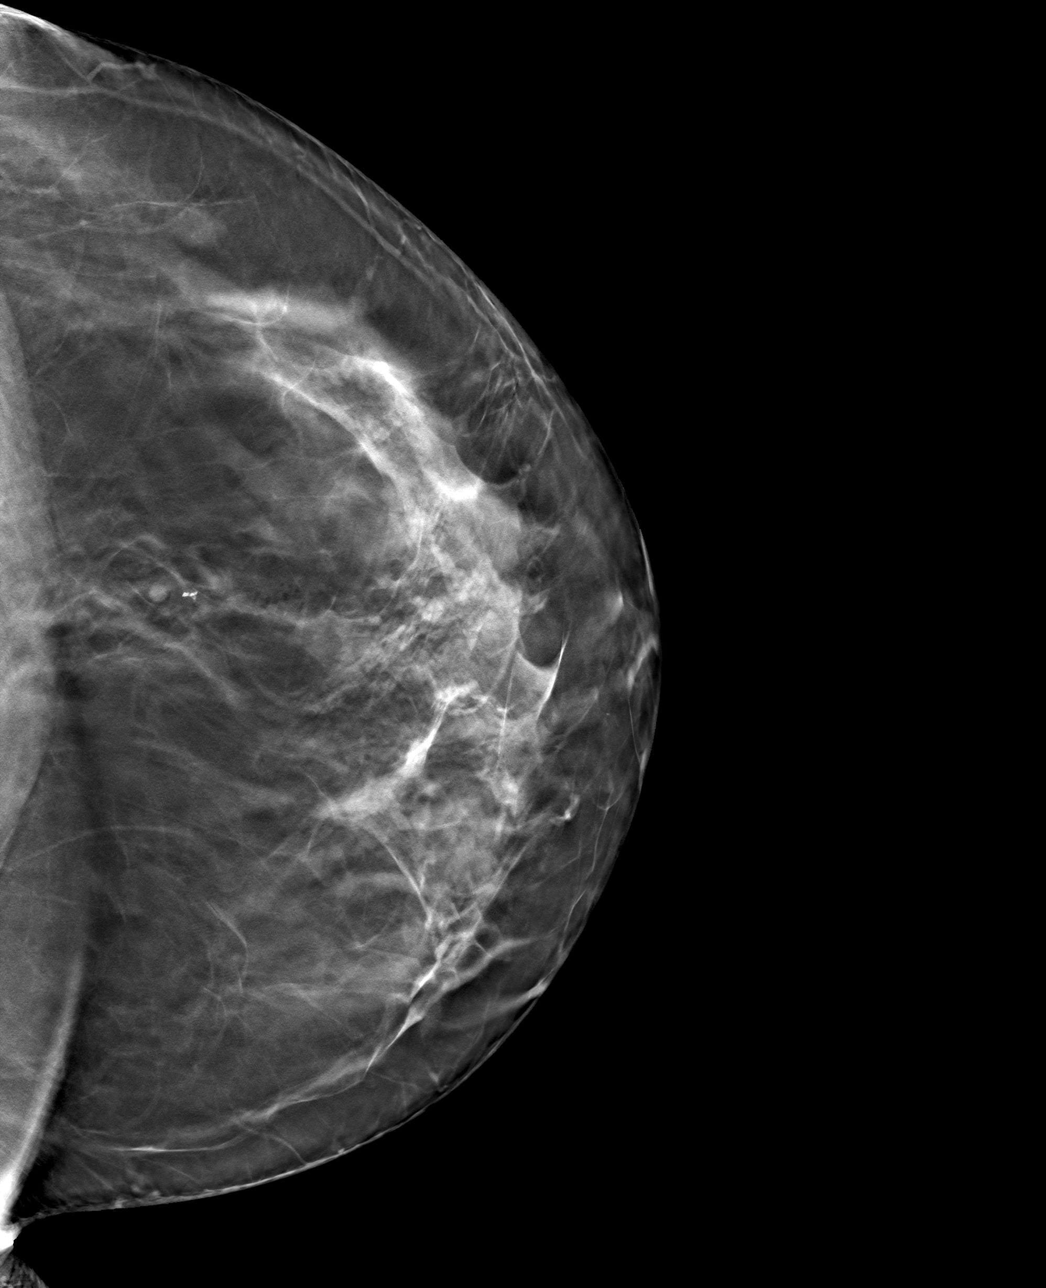

[4 of 12 positions shown; findings below may reference images not displayed]

FINDINGS: 3D Mammographic images were obtained following ultrasound guided
biopsy of a small upper left breast mass. The biopsy marking clip is
in expected position at the site of biopsy.
IMPRESSION: Appropriate positioning of the ribbon shaped biopsy marking clip at
the site of biopsy in the just anterior to the small mass in upper
left breast.

Final Assessment: Post Procedure Mammograms for Marker Placement

## 2021-09-16 ENCOUNTER — Telehealth: Payer: Commercial Managed Care - HMO | Admitting: Family

## 2021-09-16 DIAGNOSIS — J069 Acute upper respiratory infection, unspecified: Secondary | ICD-10-CM | POA: Diagnosis not present

## 2021-09-16 MED ORDER — CETIRIZINE HCL 10 MG PO TABS: 10.0000 mg | ORAL_TABLET | Freq: Every day | ORAL | 1 refills | Status: DC

## 2021-09-16 MED ORDER — FLUTICASONE PROPIONATE 50 MCG/ACT NA SUSP: 2.0000 | Freq: Every day | NASAL | 6 refills | Status: DC

## 2021-09-16 NOTE — Progress Notes (Signed)

## 2021-09-17 ENCOUNTER — Ambulatory Visit: Payer: Managed Care, Other (non HMO) | Admitting: Family Medicine

## 2021-09-21 ENCOUNTER — Encounter: Payer: Commercial Managed Care - HMO | Admitting: Internal Medicine

## 2021-09-26 ENCOUNTER — Encounter: Payer: Self-pay | Admitting: Internal Medicine

## 2021-09-26 ENCOUNTER — Other Ambulatory Visit: Payer: Self-pay

## 2021-09-26 ENCOUNTER — Ambulatory Visit (INDEPENDENT_AMBULATORY_CARE_PROVIDER_SITE_OTHER): Payer: Commercial Managed Care - HMO | Admitting: Internal Medicine

## 2021-09-26 DIAGNOSIS — H6503 Acute serous otitis media, bilateral: Secondary | ICD-10-CM

## 2021-09-26 NOTE — Patient Instructions (Addendum)
To Ms. Shimizu,   It was a pleasure meeting you today! Today we discussed the feelings of ear itching.   For your ear symptoms, I would recommend continuing using Flonase (generic is fluticasone). This can be picked up over the counter, and I would recommend picking up the generic brand. I would also recommend a neti pot for your sinuses. If you are going to use tap water for a neti pot, please make sure to boil it and let it cool to room temperature before using it.   We will have you come back in 3 month's time.  Have a good day,  Maudie Mercury, MD

## 2021-09-27 DIAGNOSIS — H6590 Unspecified nonsuppurative otitis media, unspecified ear: Secondary | ICD-10-CM | POA: Insufficient documentation

## 2021-09-27 NOTE — Progress Notes (Signed)
   CC: Ear Pain  HPI:  [name] is a 46 y.o. person, with a PMH noted below, who presents to the clinic Ear pain. To see the management of their acute and chronic conditions, please see the A&P note under the Encounters tab.   Past Medical History:  Diagnosis Date   Abscess of left axilla 12/03/2019   Presents with 4d of painful abscess under L axilla after shaving. No fever or chills. It has not drained. On exam, there is a small area of draining pus surrounded by 1cm area of fluctuance and erythema, surrounded by a larger indurated area. Not significantly tender. No tracking noted. Small amount of pus expressed.   Hepatitis C    HFrEF (heart failure with reduced ejection fraction) ([place])    Hypertension    Left breast mass 07/22/2019   Review of Systems:   Review of Systems  Constitutional:  Negative for fever.  HENT:  Positive for congestion, ear discharge and ear pain. Negative for nosebleeds, sinus pain, sore throat and tinnitus.   Eyes:  Negative for blurred vision, double vision, photophobia and pain.  Respiratory:  Negative for stridor.   Gastrointestinal:  Negative for nausea and vomiting.  Skin:  Negative for itching.    Physical Exam:  Vitals:   09/26/21 1452  BP: 136/82  Pulse: 73  Temp: 99 F (37.2 C)  TempSrc: Oral  SpO2: 100%  Weight: 180 lb 4.8 oz (81.8 kg)   Physical Exam Constitutional:      General: She is not in acute distress.    Appearance: Normal appearance. She is not ill-appearing, toxic-appearing or diaphoretic.  HENT:     Head: Normocephalic.     Right Ear: Ear canal and external ear normal. There is no impacted cerumen.     Left Ear: Ear canal and external ear normal. There is no impacted cerumen.     Ears:     Comments: Minimum cerumen appreciated in bilateral canals, small serous effusions present bilaterally    Nose: No congestion or rhinorrhea.     Mouth/Throat:     Mouth: Mucous membranes are moist.     Pharynx: Oropharynx is  clear. No oropharyngeal exudate or posterior oropharyngeal erythema.  Eyes:     General:        Right eye: No discharge.        Left eye: No discharge.     Conjunctiva/sclera: Conjunctivae normal.  Neurological:     Mental Status: She is alert and oriented to person, place, and time.     Assessment & Plan:   See Encounters Tab for problem based charting.  Patient discussed with Dr. Philipp Ovens  No problem-specific Assessment & Plan notes found for this encounter.

## 2021-09-27 NOTE — Assessment & Plan Note (Signed)
Patient presents for follow up after a telehealth visit on 09/16/2021 for a URI. She states that the week preceding the telehealth appointment, she was having a cough, "some congestion," and a sensation of ear and head fullness with fluid coming out of her ears. She states that the nasal spray that she used from the appointment has helped, but she feels that "deep" in the face she feels itchy. She denies any fevers, headaches, nausea, vomiting, or wheezing.   A/P:  Patient presents with signs and symptoms of bilateral serous otitis media, with no perforation of the TMs. Discussed continued use of Flonase as well as utilizing a neti pot.  - Continue conservative management.

## 2021-10-03 NOTE — Progress Notes (Signed)
Internal Medicine Clinic Attending ? ?Case discussed with Dr. Winters  At the time of the visit.  We reviewed the resident?s history and exam and pertinent patient test results.  I agree with the assessment, diagnosis, and plan of care documented in the resident?s note.  ?

## 2021-10-19 ENCOUNTER — Other Ambulatory Visit (HOSPITAL_COMMUNITY): Payer: Self-pay

## 2021-10-24 ENCOUNTER — Other Ambulatory Visit: Payer: Self-pay

## 2021-10-24 ENCOUNTER — Other Ambulatory Visit (HOSPITAL_COMMUNITY): Payer: Self-pay

## 2021-10-24 DIAGNOSIS — I10 Essential (primary) hypertension: Secondary | ICD-10-CM

## 2021-10-24 MED ORDER — SPIRONOLACTONE 25 MG PO TABS
25.0000 mg | ORAL_TABLET | Freq: Every day | ORAL | 1 refills | Status: DC
  Filled 2021-10-24: qty 90, 90d supply, fill #0
  Filled 2022-01-18: qty 90, 90d supply, fill #1

## 2021-11-26 ENCOUNTER — Other Ambulatory Visit: Payer: Self-pay

## 2021-11-26 DIAGNOSIS — I10 Essential (primary) hypertension: Secondary | ICD-10-CM

## 2021-11-26 MED ORDER — LOSARTAN POTASSIUM-HCTZ 100-25 MG PO TABS: 1.0000 | ORAL_TABLET | Freq: Every day | ORAL | 1 refills | Status: DC

## 2021-11-26 NOTE — Telephone Encounter (Signed)
Please call patient to follow up next month.

## 2021-12-05 ENCOUNTER — Encounter: Payer: Commercial Managed Care - HMO | Admitting: Student

## 2021-12-11 ENCOUNTER — Ambulatory Visit: Payer: Commercial Managed Care - HMO | Admitting: Internal Medicine

## 2021-12-11 ENCOUNTER — Encounter: Payer: Self-pay | Admitting: Student

## 2021-12-11 ENCOUNTER — Other Ambulatory Visit (HOSPITAL_COMMUNITY): Payer: Self-pay

## 2021-12-11 VITALS — BP 134/80 | HR 64 | Temp 98.2°F | Ht 62.0 in | Wt 188.2 lb

## 2021-12-11 DIAGNOSIS — I10 Essential (primary) hypertension: Secondary | ICD-10-CM | POA: Diagnosis not present

## 2021-12-11 DIAGNOSIS — F17210 Nicotine dependence, cigarettes, uncomplicated: Secondary | ICD-10-CM

## 2021-12-11 DIAGNOSIS — G4733 Obstructive sleep apnea (adult) (pediatric): Secondary | ICD-10-CM

## 2021-12-11 DIAGNOSIS — I110 Hypertensive heart disease with heart failure: Secondary | ICD-10-CM | POA: Diagnosis not present

## 2021-12-11 DIAGNOSIS — L309 Dermatitis, unspecified: Secondary | ICD-10-CM | POA: Diagnosis not present

## 2021-12-11 DIAGNOSIS — I5022 Chronic systolic (congestive) heart failure: Secondary | ICD-10-CM | POA: Diagnosis not present

## 2021-12-11 DIAGNOSIS — Z0000 Encounter for general adult medical examination without abnormal findings: Secondary | ICD-10-CM

## 2021-12-11 DIAGNOSIS — I5020 Unspecified systolic (congestive) heart failure: Secondary | ICD-10-CM

## 2021-12-11 DIAGNOSIS — Z13220 Encounter for screening for lipoid disorders: Secondary | ICD-10-CM

## 2021-12-11 DIAGNOSIS — Z1211 Encounter for screening for malignant neoplasm of colon: Secondary | ICD-10-CM

## 2021-12-11 DIAGNOSIS — H1131 Conjunctival hemorrhage, right eye: Secondary | ICD-10-CM

## 2021-12-11 MED ORDER — HYDROCORTISONE 1 % EX CREA
TOPICAL_CREAM | Freq: Two times a day (BID) | CUTANEOUS | 0 refills | Status: AC
  Filled 2021-12-11: qty 28.35, 15d supply, fill #0

## 2021-12-11 NOTE — Assessment & Plan Note (Signed)
Patient has complaint of skin patient believes with history of eczema.  On exam she has mildly raised lesions that are darker in appearance that her skin with eczema.  Advised patient she should use hypoallergenic detergents and soaps irritate the skin the triggers for eczema.  Patient agreed.  I will send in hydrocortisone 1% to the pharmacy in case patient needs it for symptomatic relief. -Continue to monitor on subsequent visits.

## 2021-12-11 NOTE — Assessment & Plan Note (Signed)
A1c, lipid panel and colonoscopy ordered for preventive maintenance.  ASCVD risk calculated and was 2.9 % indicating no need for a statin at this time.

## 2021-12-11 NOTE — Progress Notes (Addendum)
CC: HTN follow up  HPI:  [name] is a 46 y.o. with medical history as below presenting to [facility] for HTN follow up.   Please see problem-based list for further details, assessments, and plans.  Past Medical History:  Diagnosis Date   Abscess of left axilla 12/03/2019   Presents with 4d of painful abscess under L axilla after shaving. No fever or chills. It has not drained. On exam, there is a small area of draining pus surrounded by 1cm area of fluctuance and erythema, surrounded by a larger indurated area. Not significantly tender. No tracking noted. Small amount of pus expressed.   Essential hypertension 04/27/2007   Qualifier: Diagnosis of  By: Carmie End MD, Erin     Hepatitis C    HFrEF (heart failure with reduced ejection fraction) ([place])    Hypertension    Left breast mass 07/22/2019     Current Outpatient Medications (Cardiovascular):    amLODipine (NORVASC) 10 MG tablet, Take 1 tablet (10 mg total) by mouth daily.   carvedilol (COREG) 25 MG tablet, Take 1 tablet (25 mg total) by mouth 2 (two) times daily.   losartan-hydrochlorothiazide (HYZAAR) 100-25 MG tablet, Take 1 tablet by mouth daily.   spironolactone (ALDACTONE) 25 MG tablet, Take 1 tablet (25 mg total) by mouth daily.  Current Outpatient Medications (Respiratory):    cetirizine (ZYRTEC ALLERGY) 10 MG tablet, Take 1 tablet (10 mg total) by mouth daily.   fluticasone (FLONASE) 50 MCG/ACT nasal spray, Place 2 sprays into both nostrils daily.    Current Outpatient Medications (Other):    hydrocortisone cream 1 %, Apply topically 2 (two) times daily.   varenicline (CHANTIX STARTING MONTH PAK) 0.5 MG X 11 & 1 MG X 42 tablet, Take one 0.5 mg tablet by mouth once daily for 3 days, then increase to one 0.5 mg tablet twice daily for 4 days, then increase to one 1 mg tablet twice daily.  Review of Systems:  Review of system negative unless stated in the problem list or HPI.    Physical Exam:  Vitals:   12/11/21  0847  BP: 134/80  Pulse: 64  Temp: 98.2 F (36.8 C)  TempSrc: Oral  SpO2: 100%  Weight: 188 lb 3.2 oz (85.4 kg)  Height: '5\' 2"'$  (1.575 m)    Physical Exam General: NAD HENT: NCAT, right eye with redness on the lateral aspect. EOMI, vision unaffected.  Lungs: CTAB, no wheeze, rhonchi or rales.  Cardiovascular: NSR, diastolic murmur heart best at aortic region, 2+ pulses in all extremities. No LE edema Abdomen: No TTP, normal bowel sounds MSK: No asymmetry or muscle atrophy.  Skin: few mildly raised lesion near the axilla and lateral chest.  Neuro: Alert and oriented x4. CN grossly intact Psych: Normal mood and normal affect   Assessment & Plan:   Essential hypertension Patient has HTN and is on Amlodipine 10 mg, Hyzaar 100-25 mg, Carvedilol 25 BID, and spironolactone. She endorses compliance to these medication and reports not missing any doses in the last 2 weeks. BP today: 134/80 HR 64. BMP last checked in 06/2020 and was wnl.  -Continue current meds -Obtain BMP, Lipid and A1c this visit -Encouraged to consider decreasing smoking and etoh use. -Obtain sleep study as it was previously ordered but missed by the patient. If present this may be having a negative effect on her HTN, and CHF.   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 Echo 06/2020 with improved EF to 60-65%. Denies syncopal or presyncopal episodes, dyspnea, orthopnea, lower extremity swelling, chest pain, or palpitations. Medications are Hyzaar, Carvedilol and Spironolactone. -Continue current medications -Follow up in 3 months -Sleep study as outlined in HTN  Healthcare maintenance A1c, lipid panel and colonoscopy ordered for preventive maintenance.  ASCVD risk calculated and was 2.9 % indicating no need for a statin at this time.   Eczema Patient has complaint of skin patient believes with history of eczema.   On exam she has mildly raised lesions that are darker in appearance that her skin with eczema.  Advised patient she should use hypoallergenic detergents and soaps irritate the skin the triggers for eczema.  Patient agreed.  I will send in hydrocortisone 1% to the pharmacy in case patient needs it for symptomatic relief. -Continue to monitor on subsequent visits.  Subconjunctival hemorrhage, non-traumatic, right Patient has subconjunctival hemorrhage 2/2 to capillary rupture. Provided patient reassurance and alarm symptoms that can cause her to seek emergent care including pain in the eye and vision changes.   Patient discussed with Dr. Dollene Cleveland, MD

## 2021-12-11 NOTE — Assessment & Plan Note (Addendum)
Patient has HTN and is on Amlodipine 10 mg, Hyzaar 100-25 mg, Carvedilol 25 BID, and spironolactone. She endorses compliance to these medication and reports not missing any doses in the last 2 weeks. BP today: 134/80 HR 64. BMP last checked in 06/2020 and was wnl.  -Continue current meds -Obtain BMP, Lipid and A1c this visit -Encouraged to consider decreasing smoking and etoh use. -Obtain sleep study as it was previously ordered but missed by the patient. If present this may be having a negative effect on her HTN, and CHF.

## 2021-12-11 NOTE — Assessment & Plan Note (Signed)
Patient has hx of HF with recovered EF. Echo 06/2020 with improved EF to 60-65%. Denies syncopal or presyncopal episodes, dyspnea, orthopnea, lower extremity swelling, chest pain, or palpitations. Medications are Hyzaar, Carvedilol and Spironolactone. -Continue current medications -Follow up in 3 months -Sleep study as outlined in HTN

## 2021-12-11 NOTE — Assessment & Plan Note (Signed)
Patient has subconjunctival hemorrhage 2/2 to capillary rupture. Provided patient reassurance and alarm symptoms that can cause her to seek emergent care including pain in the eye and vision changes.

## 2021-12-11 NOTE — Patient Instructions (Addendum)
[name], it was a pleasure seeing you today! You endorsed feeling well today. Below are some of the things we talked about this visit. We look forward to seeing you in the follow up appointment!  Today we discussed: You present to the clinic for follow-up of your high pressure and heart failure.  Your blood pressure is doing well.  Please continue taking your medications.  We will perform this visit.  I advise you to get blood pressure monitor at home so we can get more readings from home. You stated you are having skin problems, I recommend using hypoallergenic soaps and detergents.  I will give you a cream to put on the affected skin but making those changes should take care of your symptoms. Stated you are having some problems with your eye.  This should resolve in a few days.  If your eye becomes painful or your vision is affected, then get urgent medical evaluation.  I have placed referral for colonoscopy and sleep study for you.  We will forward to seeing improvements for a follow-up appointment.   I have ordered the following labs today:  Lab Orders         BMP8+Anion Gap         Lipid Profile         Hemoglobin A1c        Referrals ordered today:   Referral Orders         Ambulatory referral to Gastroenterology       I have ordered the following medication/changed the following medications:   Stop the following medications: There are no discontinued medications.   Start the following medications: Meds ordered this encounter  Medications   hydrocortisone cream 1 %    Sig: Apply topically 2 (two) times daily.    Dispense:  30 g    Refill:  0     Follow-up: 3 months   Please make sure to arrive 15 minutes prior to your next appointment. If you arrive late, you may be asked to reschedule.   We look forward to seeing you next time. Please call our clinic at [phone] if you have any questions or concerns. The best time to call is Monday-Friday from  9am-4pm, but there is someone available 24/7. If after hours or the weekend, call the main hospital number and ask for the Internal Medicine Resident On-Call. If you need medication refills, please notify your pharmacy one week in advance and they will send [place] a request.  Thank you for letting us take part in your care. Wishing you the best!  Thank you, Idamae Schuller, MD

## 2021-12-12 ENCOUNTER — Encounter: Payer: Self-pay | Admitting: Internal Medicine

## 2021-12-12 LAB — LIPID PANEL
Chol/HDL Ratio: 3.1 ratio (ref 0.0–4.4)
Cholesterol, Total: 150 mg/dL (ref 100–199)
HDL: 49 mg/dL (ref >39)
LDL Chol Calc (NIH): 82 mg/dL (ref 0–99)
Triglycerides: 103 mg/dL (ref 0–149)
VLDL Cholesterol Cal: 19 mg/dL (ref 5–40)

## 2021-12-12 LAB — BMP8+ANION GAP
Anion Gap: 12 mmol/L (ref 10.0–18.0)
BUN/Creatinine Ratio: 18 (ref 9–23)
BUN: 12 mg/dL (ref 6–24)
CO2: 24 mmol/L (ref 20–29)
Calcium: 9.4 mg/dL (ref 8.7–10.2)
Chloride: 99 mmol/L (ref 96–106)
Creatinine, Ser: 0.68 mg/dL (ref 0.57–1.00)
Glucose: 94 mg/dL (ref 70–99)
Potassium: 4.8 mmol/L (ref 3.5–5.2)
Sodium: 135 mmol/L (ref 134–144)
eGFR: 109 mL/min/{1.73_m2} (ref >59)

## 2021-12-12 LAB — HEMOGLOBIN A1C
Est. average glucose Bld gHb Est-mCnc: 111 mg/dL
Hgb A1c MFr Bld: 5.5 % (ref 4.8–5.6)

## 2021-12-13 NOTE — Progress Notes (Signed)
Internal Medicine Clinic Attending  Case discussed with Dr. Khan  At the time of the visit.  We reviewed the resident's history and exam and pertinent patient test results.  I agree with the assessment, diagnosis, and plan of care documented in the resident's note.  

## 2021-12-19 ENCOUNTER — Other Ambulatory Visit (HOSPITAL_COMMUNITY): Payer: Self-pay

## 2022-01-18 ENCOUNTER — Other Ambulatory Visit (HOSPITAL_COMMUNITY): Payer: Self-pay

## 2022-01-22 ENCOUNTER — Institutional Professional Consult (permissible substitution): Payer: Commercial Managed Care - HMO | Admitting: Neurology

## 2022-01-29 ENCOUNTER — Telehealth: Payer: Self-pay | Admitting: Student

## 2022-01-29 NOTE — Telephone Encounter (Signed)
Return pt's call who stated the lump behind her left ear has been there x 2 days. But it didn't start hurting until about 20 mins ago while she's at work. At this time, no available appts this week. Informed pt to go to [facility] - stated she will go when she gets off work today.

## 2022-01-29 NOTE — Telephone Encounter (Signed)
Pt reporting a painful lump behind her left ear.  Please call the pt back.  Pt requested an appt for today if possible. Pt aware of no appts ava.

## 2022-02-05 ENCOUNTER — Other Ambulatory Visit: Payer: Self-pay | Admitting: Internal Medicine

## 2022-02-05 DIAGNOSIS — Z1231 Encounter for screening mammogram for malignant neoplasm of breast: Secondary | ICD-10-CM

## 2022-03-06 ENCOUNTER — Ambulatory Visit
Admission: RE | Admit: 2022-03-06 | Discharge: 2022-03-06 | Disposition: A | Discharge status: Home / Self Care | Payer: Commercial Managed Care - HMO | Source: Ambulatory Visit | Attending: Internal Medicine | Admitting: Internal Medicine

## 2022-03-06 DIAGNOSIS — Z1231 Encounter for screening mammogram for malignant neoplasm of breast: Secondary | ICD-10-CM

## 2022-03-11 ENCOUNTER — Ambulatory Visit
Admission: EM | Admit: 2022-03-11 | Discharge: 2022-03-11 | Disposition: A | Discharge status: Home / Self Care | Payer: Commercial Managed Care - HMO | Attending: Urgent Care | Admitting: Urgent Care

## 2022-03-11 ENCOUNTER — Ambulatory Visit (INDEPENDENT_AMBULATORY_CARE_PROVIDER_SITE_OTHER): Payer: Commercial Managed Care - HMO

## 2022-03-11 DIAGNOSIS — R059 Cough, unspecified: Secondary | ICD-10-CM | POA: Diagnosis not present

## 2022-03-11 DIAGNOSIS — J069 Acute upper respiratory infection, unspecified: Secondary | ICD-10-CM

## 2022-03-11 MED ORDER — PROMETHAZINE-DM 6.25-15 MG/5ML PO SYRP: 5.0000 mL | ORAL_SOLUTION | Freq: Four times a day (QID) | ORAL | 0 refills | Status: DC | PRN

## 2022-03-11 MED ORDER — GUAIFENESIN ER 600 MG PO TB12: 600.0000 mg | ORAL_TABLET | Freq: Two times a day (BID) | ORAL | 0 refills | Status: DC | PRN

## 2022-03-11 NOTE — ED Triage Notes (Signed)
Pt c/o chest congestion worse at night, cough   Onset ~ 1 week ago

## 2022-03-11 NOTE — ED Provider Notes (Signed)
[facility]    CSN: [id number] Arrival date & time: 03/11/22  0853      History   Chief Complaint Chief Complaint  Patient presents with   Cough    HPI [name] L Gregg is a 46 y.o. female.   Pleasant 46 year old female with a known history of essential hypertension and heart failure with reduced ejection fraction presents today due to a 1 week history of a cough.  She is a smoker, but denies any chronic pulmonary issues.  She denies chest pain or palpitations.  She states over the past week her cough has become increasingly productive, most recently with thick white sputum.  She reports inability to sleep at night due to severe coughing.  She denies edema or weight gain.  She states she has to sleep on top of numerous pillows.  She has been taking her blood pressure medication as prescribed.  She has been hospitalized in the past due to "fluid on her lungs".   Cough   Past Medical History:  Diagnosis Date   Abscess of left axilla 12/03/2019   Presents with 4d of painful abscess under L axilla after shaving. No fever or chills. It has not drained. On exam, there is a small area of draining pus surrounded by 1cm area of fluctuance and erythema, surrounded by a larger indurated area. Not significantly tender. No tracking noted. Small amount of pus expressed.   Essential hypertension 04/27/2007   Qualifier: Diagnosis of  By: Carmie End MD, Erin     Hepatitis C    HFrEF (heart failure with reduced ejection fraction) ([place])    Hypertension    Left breast mass 07/22/2019    Patient Active Problem List   Diagnosis Date Noted   Eczema 12/11/2021   Subconjunctival hemorrhage, non-traumatic, right 12/11/2021   Serous otitis media 09/27/2021   Pain of left calf 08/07/2021   Localized swelling of left lower leg 08/07/2021   Lower urinary tract symptoms 07/31/2021   Stress incontinence 06/25/2021   Left-sided chest wall pain 06/11/2021   Piriformis syndrome 01/31/2021   Carpal  tunnel syndrome, bilateral 07/10/2020   COVID-19 04/19/2020   Healthcare maintenance 12/03/2019   Cervical cancer screening 09/30/2017   Alcohol use 08/01/2017   hx of Heart failure with reduced ejection fraction ([place]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, resolved as of echo 06/2020 08/01/2017   Hx of hepatitis C 11/02/2007   Tobacco use disorder 06/18/2007   Essential hypertension 04/27/2007    Past Surgical History:  Procedure Laterality Date   LEFT AND RIGHT HEART CATHETERIZATION WITH CORONARY ANGIOGRAM N/A 06/23/2013   Procedure: LEFT AND RIGHT HEART CATHETERIZATION WITH CORONARY ANGIOGRAM;  Surgeon: Birdie Riddle, MD;  Location: [facility];  Service: Cardiovascular;  Laterality: N/A;   LIVER BIOPSY     LIVER BIOPSY      OB History   No obstetric history on file.      Home Medications    Prior to Admission medications   Medication Sig Start Date End Date Taking? Authorizing Provider  guaiFENesin (MUCINEX) 600 MG 12 hr tablet Take 1 tablet (600 mg total) by mouth 2 (two) times daily as needed for to loosen phlegm or cough. 03/11/22  Yes Beckett Hickmon L, PA  promethazine-dextromethorphan (PROMETHAZINE-DM) 6.25-15 MG/5ML syrup Take 5 mLs by mouth 4 (four) times daily as needed for cough. BEST TO TAKE ONLY AT BEDTIME 03/11/22  Yes Mahamed Zalewski L, PA  amLODipine (NORVASC) 10 MG tablet Take 1 tablet (10 mg total)  by mouth daily. 08/15/21   Timothy Lasso, MD  carvedilol (COREG) 25 MG tablet Take 1 tablet (25 mg total) by mouth 2 (two) times daily. 08/16/21   Timothy Lasso, MD  cetirizine (ZYRTEC ALLERGY) 10 MG tablet Take 1 tablet (10 mg total) by mouth daily. 09/16/21   Sharion Balloon, FNP  fluticasone (FLONASE) 50 MCG/ACT nasal spray Place 2 sprays into both nostrils daily. 09/16/21   Evelina Dun A, FNP  losartan-hydrochlorothiazide (HYZAAR) 100-25 MG tablet Take 1 tablet by mouth daily. 11/26/21   Lacinda Axon, MD  spironolactone (ALDACTONE) 25 MG tablet Take 1 tablet (25 mg  total) by mouth daily. 10/24/21   Riesa Pope, MD    Family History Family History  Problem Relation Age of Onset   Hypertension Mother    Hyperlipidemia Mother    Diabetes Mother    Hepatitis C Mother    Diabetes Maternal Aunt     Social History Social History   Tobacco Use   Smoking status: Some Days    Packs/day: 0.50    Types: Cigarettes   Smokeless tobacco: Never   Tobacco comments:    6-7 per day ./  has Vaped   Substance Use Topics   Alcohol use: Yes    Alcohol/week: 28.0 standard drinks of alcohol    Types: 28 Standard drinks or equivalent per week   Drug use: Yes    Frequency: 0.5 times per week    Types: Marijuana     Allergies   Lisinopril   Review of Systems Review of Systems  Respiratory:  Positive for cough.   As per HPI   Physical Exam Triage Vital Signs ED Triage Vitals [03/11/22 0935]  Enc Vitals Group     BP 127/82     Pulse Rate 68     Resp 16     Temp 98.2 F (36.8 C)     Temp Source Oral     SpO2 98 %     Weight      Height      Head Circumference      Peak Flow      Pain Score 0     Pain Loc      Pain Edu?      Excl. in [place]?    No data found.  Updated Vital Signs BP 127/82 (BP Location: Left Arm)   Pulse 68   Temp 98.2 F (36.8 C) (Oral)   Resp 16   LMP 03/06/2022   SpO2 98%   Visual Acuity Right Eye Distance:   Left Eye Distance:   Bilateral Distance:    Right Eye Near:   Left Eye Near:    Bilateral Near:     Physical Exam Vitals and nursing note reviewed.  Constitutional:      General: She is not in acute distress.    Appearance: Normal appearance. She is well-developed. She is obese. She is not ill-appearing, toxic-appearing or diaphoretic.  HENT:     Head: Normocephalic and atraumatic.     Right Ear: Tympanic membrane, ear canal and external ear normal. There is no impacted cerumen.     Left Ear: Tympanic membrane, ear canal and external ear normal. There is no impacted cerumen.     Nose:  Nose normal. No congestion or rhinorrhea.     Mouth/Throat:     Mouth: Mucous membranes are moist.     Pharynx: Oropharynx is clear. No oropharyngeal exudate or posterior oropharyngeal erythema.  Eyes:  General: No scleral icterus.       Right eye: No discharge.        Left eye: No discharge.     Extraocular Movements: Extraocular movements intact.     Conjunctiva/sclera: Conjunctivae normal.     Pupils: Pupils are equal, round, and reactive to light.  Cardiovascular:     Rate and Rhythm: Normal rate and regular rhythm.     Pulses: Normal pulses.     Heart sounds: Murmur heard.  Pulmonary:     Effort: Pulmonary effort is normal. No accessory muscle usage, respiratory distress or retractions.     Breath sounds: Normal breath sounds and air entry. No stridor, decreased air movement or transmitted upper airway sounds. No decreased breath sounds, wheezing, rhonchi or rales.  Chest:     Chest wall: No tenderness.  Abdominal:     Palpations: Abdomen is soft.     Tenderness: There is no abdominal tenderness.  Musculoskeletal:        General: No swelling.     Cervical back: Normal range of motion and neck supple. No rigidity or tenderness.     Right lower leg: No edema.     Left lower leg: No edema.  Lymphadenopathy:     Cervical: No cervical adenopathy.  Skin:    General: Skin is warm and dry.     Capillary Refill: Capillary refill takes less than 2 seconds.     Coloration: Skin is not jaundiced.     Findings: No bruising, erythema or rash.  Neurological:     General: No focal deficit present.     Mental Status: She is alert and oriented to person, place, and time.     Sensory: No sensory deficit.     Motor: No weakness.  Psychiatric:        Mood and Affect: Mood normal.      UC Treatments / Results  Labs (all labs ordered are listed, but only abnormal results are displayed) Labs Reviewed - No data to display  EKG   Radiology DG Chest 2 View  Result Date:  03/11/2022 CLINICAL DATA:  46 year old female with cough for 1 week. EXAM: CHEST - 2 VIEW COMPARISON:  Chest radiographs 08/18/2017 and earlier. FINDINGS: PA and lateral views at 1044 hours. Chronic cardiomegaly and tortuosity of the thoracic aorta appears stable. Visualized tracheal air column is within normal limits. Normal lung volumes. Both lungs appear clear. No pulmonary edema or pleural effusion. No acute osseous abnormality identified. Negative visible bowel gas. IMPRESSION: Chronic cardiomegaly.  No active cardiopulmonary disease. Electronically Signed   By: Genevie Ann M.D.   On: 03/11/2022 10:54    Procedures Procedures (including critical care time)  Medications Ordered in UC Medications - No data to display  Initial Impression / Assessment and Plan / UC Course  I have reviewed the triage vital signs and the nursing notes.  Pertinent labs & imaging results that were available during my care of the patient were reviewed by me and considered in my medical decision making (see chart for details).     Viral URI - CXR does not show evidence of pleural effusion, lungs appear CTA on exam. No evidence of pneumonia. Supportive measures for now with mucinex, cough suppression only at night. No indication for initiating abx, VSS Cardiomegaly - appears stable. BP well controlled, pt follows with cardiology Smoker - cessation encouraged to prevent recurrence of #1. Pt cannot take chantix, recommended discussing buproprion/ wellbutrin with PCP    Final  Clinical Impressions(s) / UC Diagnoses   Final diagnoses:  Viral upper respiratory tract infection with cough     Discharge Instructions      Your x-ray does not reveal any source of pneumonia.  There is no fluid in your lungs. Your heart enlargement appears chronic.  Please continue to follow with your cardiologist. Your symptoms are most likely viral in nature.  Cutting back on smoking would certainly help prevent respiratory infections in  the future.  Discussed possibly using buspirone to help with smoking cessation if you cannot tolerate Chantix. I have called in Mucinex for you to take in addition to a cough medication.  Please only use the cough medication at nighttime to help you sleep. Please use a vaporizer over your bed to help humidify your airway.     ED Prescriptions     Medication Sig Dispense Auth. Provider   promethazine-dextromethorphan (PROMETHAZINE-DM) 6.25-15 MG/5ML syrup Take 5 mLs by mouth 4 (four) times daily as needed for cough. BEST TO TAKE ONLY AT BEDTIME 118 mL Ghalia Reicks L, PA   guaiFENesin (MUCINEX) 600 MG 12 hr tablet Take 1 tablet (600 mg total) by mouth 2 (two) times daily as needed for to loosen phlegm or cough. 20 tablet Alexzavier Girardin L, [place]      PDMP not reviewed this encounter.   Chaney Malling, [place] 03/12/22 2107

## 2022-03-11 NOTE — Discharge Instructions (Addendum)
Your x-ray does not reveal any source of pneumonia.  There is no fluid in your lungs. Your heart enlargement appears chronic.  Please continue to follow with your cardiologist. Your symptoms are most likely viral in nature.  Cutting back on smoking would certainly help prevent respiratory infections in the future.  Discussed possibly using buspirone to help with smoking cessation if you cannot tolerate Chantix. I have called in Mucinex for you to take in addition to a cough medication.  Please only use the cough medication at nighttime to help you sleep. Please use a vaporizer over your bed to help humidify your airway.

## 2022-03-12 ENCOUNTER — Encounter: Payer: Commercial Managed Care - HMO | Admitting: Internal Medicine

## 2022-03-13 ENCOUNTER — Encounter: Payer: Commercial Managed Care - HMO | Admitting: Internal Medicine

## 2022-03-24 ENCOUNTER — Telehealth: Payer: Commercial Managed Care - HMO | Admitting: Nurse Practitioner

## 2022-03-24 DIAGNOSIS — B3731 Acute candidiasis of vulva and vagina: Secondary | ICD-10-CM | POA: Diagnosis not present

## 2022-03-24 MED ORDER — FLUCONAZOLE 150 MG PO TABS: 150.0000 mg | ORAL_TABLET | Freq: Once | ORAL | 0 refills | Status: AC

## 2022-03-24 NOTE — Progress Notes (Signed)

## 2022-03-24 NOTE — Progress Notes (Signed)
I have spent 5 minutes in review of e-visit questionnaire, review and updating patient chart, medical decision making and response to patient.  ° °Kasyn Stouffer W Sharrie Self, NP ° °  °

## 2022-03-25 ENCOUNTER — Encounter: Payer: Commercial Managed Care - HMO | Admitting: Student

## 2022-03-25 ENCOUNTER — Encounter: Payer: Self-pay | Admitting: General Practice

## 2022-04-08 ENCOUNTER — Telehealth: Payer: Commercial Managed Care - HMO | Admitting: Physician Assistant

## 2022-04-08 DIAGNOSIS — R051 Acute cough: Secondary | ICD-10-CM | POA: Diagnosis not present

## 2022-04-08 DIAGNOSIS — J310 Chronic rhinitis: Secondary | ICD-10-CM

## 2022-04-08 MED ORDER — LEVOCETIRIZINE DIHYDROCHLORIDE 5 MG PO TABS: 5.0000 mg | ORAL_TABLET | Freq: Every day | ORAL | 0 refills | Status: DC

## 2022-04-08 MED ORDER — BENZONATATE 100 MG PO CAPS: 100.0000 mg | ORAL_CAPSULE | Freq: Three times a day (TID) | ORAL | 0 refills | Status: DC | PRN

## 2022-04-08 MED ORDER — FLUTICASONE PROPIONATE 50 MCG/ACT NA SUSP: 2.0000 | Freq: Every day | NASAL | 0 refills | Status: DC

## 2022-04-08 NOTE — Progress Notes (Signed)
E visit for Allergic Rhinitis We are sorry that you are not feeling well.  Here is how we plan to help!  Based on what you have shared with me it looks like you have Allergic Rhinitis.  Rhinitis is when a reaction occurs that causes nasal congestion, runny nose, sneezing, and itching.  Most types of rhinitis are caused by an inflammation and are associated with symptoms in the eyes ears or throat. There are several types of rhinitis.  The most common are acute rhinitis, which is usually caused by a viral illness, allergic or seasonal rhinitis, and nonallergic or year-round rhinitis.  Nasal allergies occur certain times of the year.  Allergic rhinitis is caused when allergens in the air trigger the release of histamine in the body.  Histamine causes itching, swelling, and fluid to build up in the fragile linings of the nasal passages, sinuses and eyelids.  An itchy nose and clear discharge are common.  *I am not sure yours is exactly allergy, but you are having a histamine response in your body with your menstrual cycle causing these symptoms. These should help the symptoms. Would recommend to discuss further with your Primary Care Provider or OB/GYN (if you have one).  I recommend the following over the counter treatments: You should take a daily dose of antihistamine and Xyzal 5 mg take 1 tablet daily at bedtime  I also would recommend a nasal spray: Flonase 2 sprays into each nostril once daily  HOME CARE:  You can use an over-the-counter saline nasal spray as needed Avoid areas where there is heavy dust, mites, or molds Stay indoors on windy days during the pollen season Keep windows closed in home, at least in bedroom; use air conditioner. Use high-efficiency house air filter Keep windows closed in car, turn AC on re-circulate Avoid playing out with dog during pollen season  GET HELP RIGHT AWAY IF:  If your symptoms do not improve within 10 days You become short of breath You develop  yellow or green discharge from your nose for over 3 days You have coughing fits  MAKE SURE YOU:  Understand these instructions Will watch your condition Will get help right away if you are not doing well or get worse  Thank you for choosing an e-visit. Your e-visit answers were reviewed by a board certified advanced clinical practitioner to complete your personal care plan. Depending upon the condition, your plan could have included both over the counter or prescription medications. Please review your pharmacy choice. Be sure that the pharmacy you have chosen is open so that you can pick up your prescription now.  If there is a problem you may message your provider in [place] to have the prescription routed to another pharmacy. Your safety is important to [place]. If you have drug allergies check your prescription carefully.  For the next 24 hours, you can use MyChart to ask questions about today's visit, request a non-urgent call back, or ask for a work or school excuse from your e-visit provider. You will get an email in the next two days asking about your experience. I hope that your e-visit has been valuable and will speed your recovery.   I have spent 5 minutes in review of e-visit questionnaire, review and updating patient chart, medical decision making and response to patient.   Mar Daring, PA-C

## 2022-04-30 ENCOUNTER — Ambulatory Visit: Payer: Commercial Managed Care - HMO | Admitting: Sports Medicine

## 2022-06-24 ENCOUNTER — Other Ambulatory Visit: Payer: Self-pay | Admitting: Student

## 2022-06-24 ENCOUNTER — Other Ambulatory Visit (HOSPITAL_COMMUNITY): Payer: Self-pay

## 2022-06-24 DIAGNOSIS — I10 Essential (primary) hypertension: Secondary | ICD-10-CM

## 2022-06-24 MED ORDER — SPIRONOLACTONE 25 MG PO TABS
25.0000 mg | ORAL_TABLET | Freq: Every day | ORAL | 1 refills | Status: DC
  Filled 2022-06-24: qty 90, 90d supply, fill #0

## 2022-07-29 ENCOUNTER — Telehealth: Payer: Commercial Managed Care - HMO | Admitting: Family Medicine

## 2022-07-29 DIAGNOSIS — N760 Acute vaginitis: Secondary | ICD-10-CM | POA: Diagnosis not present

## 2022-07-29 MED ORDER — FLUCONAZOLE 150 MG PO TABS: 150.0000 mg | ORAL_TABLET | Freq: Every day | ORAL | 0 refills | Status: DC

## 2022-07-29 NOTE — Progress Notes (Signed)

## 2022-08-05 ENCOUNTER — Ambulatory Visit: Payer: Commercial Managed Care - HMO | Admitting: Family Medicine

## 2022-08-13 ENCOUNTER — Ambulatory Visit (INDEPENDENT_AMBULATORY_CARE_PROVIDER_SITE_OTHER): Payer: Commercial Managed Care - HMO | Admitting: Internal Medicine

## 2022-08-13 ENCOUNTER — Encounter: Payer: Self-pay | Admitting: Internal Medicine

## 2022-08-13 ENCOUNTER — Other Ambulatory Visit: Payer: Self-pay

## 2022-08-13 ENCOUNTER — Other Ambulatory Visit (HOSPITAL_COMMUNITY)
Admission: RE | Admit: 2022-08-13 | Discharge: 2022-08-13 | Disposition: A | Discharge status: Home / Self Care | Payer: Commercial Managed Care - HMO | Source: Ambulatory Visit | Attending: Internal Medicine | Admitting: Internal Medicine

## 2022-08-13 ENCOUNTER — Other Ambulatory Visit (HOSPITAL_COMMUNITY): Payer: Self-pay

## 2022-08-13 VITALS — BP 125/77 | HR 60 | Temp 98.6°F | Ht 62.0 in | Wt 197.8 lb

## 2022-08-13 DIAGNOSIS — R399 Unspecified symptoms and signs involving the genitourinary system: Secondary | ICD-10-CM

## 2022-08-13 DIAGNOSIS — N898 Other specified noninflammatory disorders of vagina: Secondary | ICD-10-CM | POA: Diagnosis not present

## 2022-08-13 DIAGNOSIS — F17200 Nicotine dependence, unspecified, uncomplicated: Secondary | ICD-10-CM

## 2022-08-13 DIAGNOSIS — N760 Acute vaginitis: Secondary | ICD-10-CM | POA: Diagnosis not present

## 2022-08-13 DIAGNOSIS — B9689 Other specified bacterial agents as the cause of diseases classified elsewhere: Secondary | ICD-10-CM

## 2022-08-13 DIAGNOSIS — F17210 Nicotine dependence, cigarettes, uncomplicated: Secondary | ICD-10-CM

## 2022-08-13 DIAGNOSIS — I10 Essential (primary) hypertension: Secondary | ICD-10-CM

## 2022-08-13 MED ORDER — NICOTINE POLACRILEX 4 MG MT GUM
4.0000 mg | CHEWING_GUM | OROMUCOSAL | 0 refills | Status: DC | PRN
  Filled 2022-08-13: qty 110, 30d supply, fill #0

## 2022-08-13 MED ORDER — CARVEDILOL 25 MG PO TABS
25.0000 mg | ORAL_TABLET | Freq: Two times a day (BID) | ORAL | 3 refills | Status: DC
  Filled 2022-08-13: qty 180, 90d supply, fill #0
  Filled 2022-09-09: qty 60, 30d supply, fill #0
  Filled 2022-10-10: qty 60, 30d supply, fill #1
  Filled 2022-11-12: qty 60, 30d supply, fill #2
  Filled 2022-12-11: qty 60, 30d supply, fill #3
  Filled 2023-01-08: qty 60, 30d supply, fill #4

## 2022-08-13 MED ORDER — SPIRONOLACTONE 25 MG PO TABS
25.0000 mg | ORAL_TABLET | Freq: Every day | ORAL | 1 refills | Status: DC
  Filled 2022-08-13: qty 90, 90d supply, fill #0

## 2022-08-13 MED ORDER — AMLODIPINE BESYLATE 10 MG PO TABS
10.0000 mg | ORAL_TABLET | Freq: Every day | ORAL | 3 refills | Status: DC
  Filled 2022-08-13: qty 90, 90d supply, fill #0
  Filled 2022-11-12: qty 30, 30d supply, fill #1
  Filled 2022-12-11: qty 30, 30d supply, fill #2
  Filled 2023-01-08: qty 30, 30d supply, fill #3

## 2022-08-13 MED ORDER — NICOTINE 21 MG/24HR TD PT24
21.0000 mg | MEDICATED_PATCH | Freq: Every day | TRANSDERMAL | 1 refills | Status: DC
  Filled 2022-08-13: qty 28, 28d supply, fill #0

## 2022-08-13 MED ORDER — LOSARTAN POTASSIUM-HCTZ 100-25 MG PO TABS
1.0000 | ORAL_TABLET | Freq: Every day | ORAL | 1 refills | Status: DC
  Filled 2022-08-13 – 2022-08-14 (×2): qty 90, 90d supply, fill #0
  Filled 2022-10-10: qty 30, 30d supply, fill #0
  Filled 2022-11-12: qty 30, 30d supply, fill #1
  Filled 2022-12-03 – 2022-12-11 (×3): qty 30, 30d supply, fill #2
  Filled 2023-01-08: qty 30, 30d supply, fill #3

## 2022-08-13 NOTE — Patient Instructions (Signed)
Dear Mrs. [name],  Thank you for trusting [place] with your care.   We will order the sleep study again. We will order the nicotine patch and gum for you. We will get a self swab and call you with the results.

## 2022-08-13 NOTE — Progress Notes (Unsigned)
   CC: BP check  HPI:Ms.[place] L Geiselman is a 47 y.o. female who presents for evaluation of BP. Please see individual problem based A/P for details.  Hx of HTN and tobacco use presenting for BP follow up. Compliant with meds. Also complaints of vaginal discharge.   Depression, PHQ-9: Based on the patients  Flowsheet Row Office Visit from 08/13/2022 in [facility]  PHQ-9 Total Score 0      score we have .  Past Medical History:  Diagnosis Date   Abscess of left axilla 12/03/2019   Presents with 4d of painful abscess under L axilla after shaving. No fever or chills. It has not drained. On exam, there is a small area of draining pus surrounded by 1cm area of fluctuance and erythema, surrounded by a larger indurated area. Not significantly tender. No tracking noted. Small amount of pus expressed.   Essential hypertension 04/27/2007   Qualifier: Diagnosis of  By: Deirdre Peer MD, Erin     Hepatitis C    HFrEF (heart failure with reduced ejection fraction)    Hypertension    Left breast mass 07/22/2019   Review of Systems:   See HPI  Physical Exam: Vitals:   08/13/22 1516  BP: 125/77  Pulse: 60  Temp: 98.6 F (37 C)  TempSrc: Oral  SpO2: 99%  Weight: 197 lb 12.8 oz (89.7 kg)  Height: 5\' 2"  (1.575 m)     General: NAD HEENT: Conjunctiva nl , antiicteric sclerae, moist mucous membranes, no exudate or erythema Cardiovascular: Normal rate, regular rhythm.  No murmurs, rubs, or gallops Pulmonary : Equal breath sounds, No wheezes, rales, or rhonchi Abdominal: soft, nontender,  bowel sounds present Ext: No edema in lower extremities, no tenderness to palpation of lower extremities.   Assessment & Plan:   See Encounters Tab for problem based charting.  Essential hypertension Patient here for BP check. Current regimen include Amlodipine 10, Hyzaar 100-25, Coreg 25 BID, and Spiro. She has been compliant. BP controlled today. She is still smoking and wants to quit.  She could not afford sleep study last time, but is interested in this again.  BP control requiring 5 meds. Secondary causes being investigated. We will re-order sleep study. Smoking likely contributing to this as well. She will probably get better control with less medicines if we can get her to quit smoking. We will try nicotine replacement with her. Nicoderm patches and nicorette gum ordered. She did not want Chantix.  Tobacco use disorder Discussed smoking cessation. She wants to quit. We will try replacement therapy with patches and gum.  BV (bacterial vaginosis) Patient reported milky discharge. No other rashes or lesions described, but she wanted to be checked. She was given self swab which returned positive for BV. I have called and discussed results with her. We will treat with flagyl 500mg  BID 7 days.   Patient discussed with Dr. Mayford Knife

## 2022-08-14 ENCOUNTER — Encounter: Payer: Self-pay | Admitting: *Deleted

## 2022-08-14 ENCOUNTER — Other Ambulatory Visit (HOSPITAL_COMMUNITY): Payer: Self-pay

## 2022-08-14 LAB — CERVICOVAGINAL ANCILLARY ONLY
Bacterial Vaginitis (gardnerella): POSITIVE — AB
Candida Glabrata: NEGATIVE
Candida Vaginitis: NEGATIVE
Chlamydia: NEGATIVE
Comment: NEGATIVE
Comment: NEGATIVE
Comment: NEGATIVE
Comment: NEGATIVE
Comment: NEGATIVE
Comment: NORMAL
Neisseria Gonorrhea: NEGATIVE
Trichomonas: NEGATIVE

## 2022-08-14 MED ORDER — METRONIDAZOLE 500 MG PO TABS
500.0000 mg | ORAL_TABLET | Freq: Two times a day (BID) | ORAL | 0 refills | Status: AC
  Filled 2022-08-14: qty 14, 7d supply, fill #0

## 2022-08-14 NOTE — Telephone Encounter (Addendum)
Front Desk received the following message today from patient:  Good morning when I was there yesterday I forgot to ask for a letter taking me out of laundry for a few because of the strain it puts on my chest. Can you have someone reach out to me today please.  Please see MyChart conversation below.  Thank you!

## 2022-08-14 NOTE — Progress Notes (Signed)
Self swab positive for BV. Will treat with flagyl 500mg  BID 7 days. Discussed with patient. She voices understanding.

## 2022-08-15 ENCOUNTER — Encounter: Payer: Commercial Managed Care - HMO | Admitting: Internal Medicine

## 2022-08-15 ENCOUNTER — Encounter: Payer: Self-pay | Admitting: Internal Medicine

## 2022-08-15 ENCOUNTER — Other Ambulatory Visit (HOSPITAL_COMMUNITY): Payer: Self-pay

## 2022-08-15 DIAGNOSIS — B9689 Other specified bacterial agents as the cause of diseases classified elsewhere: Secondary | ICD-10-CM | POA: Insufficient documentation

## 2022-08-15 NOTE — Assessment & Plan Note (Signed)
Patient here for BP check. Current regimen include Amlodipine 10, Hyzaar 100-25, Coreg 25 BID, and Spiro. She has been compliant. BP controlled today. She is still smoking and wants to quit. She could not afford sleep study last time, but is interested in this again.  BP control requiring 5 meds. Secondary causes being investigated. We will re-order sleep study. Smoking likely contributing to this as well. She will probably get better control with less medicines if we can get her to quit smoking. We will try nicotine replacement with her. Nicoderm patches and nicorette gum ordered. She did not want Chantix.

## 2022-08-15 NOTE — Assessment & Plan Note (Signed)
Discussed smoking cessation. She wants to quit. We will try replacement therapy with patches and gum.

## 2022-08-15 NOTE — Assessment & Plan Note (Signed)
Patient reported milky discharge. No other rashes or lesions described, but she wanted to be checked. She was given self swab which returned positive for BV. I have called and discussed results with her. We will treat with flagyl 500mg  BID 7 days.

## 2022-08-16 ENCOUNTER — Other Ambulatory Visit (HOSPITAL_COMMUNITY): Payer: Self-pay

## 2022-08-19 NOTE — Progress Notes (Signed)
Internal Medicine Clinic Attending ° °Case discussed with Dr. Gawaluck  At the time of the visit.  We reviewed the resident’s history and exam and pertinent patient test results.  I agree with the assessment, diagnosis, and plan of care documented in the resident’s note.  °

## 2022-08-21 ENCOUNTER — Other Ambulatory Visit: Payer: Self-pay

## 2022-08-21 DIAGNOSIS — I10 Essential (primary) hypertension: Secondary | ICD-10-CM

## 2022-08-21 NOTE — Telephone Encounter (Addendum)
Incoming fax from pharmacy Message to prescriber: "We received a response stating a prescription was sent in to retail. The patient would now like to receive 90 days by mail. We cannot transfer. Please review, & if appropriate, complete and fax,sign and return."

## 2022-08-23 MED ORDER — SPIRONOLACTONE 25 MG PO TABS: 25.0000 mg | ORAL_TABLET | Freq: Every day | ORAL | 1 refills | Status: DC

## 2022-09-09 ENCOUNTER — Other Ambulatory Visit (HOSPITAL_COMMUNITY): Payer: Self-pay

## 2022-10-10 ENCOUNTER — Other Ambulatory Visit (HOSPITAL_COMMUNITY): Payer: Self-pay

## 2022-11-12 ENCOUNTER — Other Ambulatory Visit: Payer: Self-pay | Admitting: Student

## 2022-11-12 ENCOUNTER — Other Ambulatory Visit (HOSPITAL_COMMUNITY): Payer: Self-pay

## 2022-11-12 ENCOUNTER — Ambulatory Visit: Payer: Commercial Managed Care - HMO | Admitting: Family Medicine

## 2022-11-12 DIAGNOSIS — I10 Essential (primary) hypertension: Secondary | ICD-10-CM

## 2022-11-12 NOTE — Telephone Encounter (Signed)
LOV 08/13/2022. Pt sch an appt with [facility] for today 11/12/2022 but resch appt until    Georganna Skeans, MD [facility]    spironolactone (ALDACTONE) 25 MG tablet  [facility] Address: [address] [place], [place], [place] [address] phone: [phone]

## 2022-11-15 ENCOUNTER — Other Ambulatory Visit (HOSPITAL_COMMUNITY): Payer: Self-pay

## 2022-11-15 ENCOUNTER — Other Ambulatory Visit: Payer: Self-pay | Admitting: Internal Medicine

## 2022-11-15 DIAGNOSIS — I10 Essential (primary) hypertension: Secondary | ICD-10-CM

## 2022-11-15 MED ORDER — SPIRONOLACTONE 25 MG PO TABS
25.0000 mg | ORAL_TABLET | Freq: Every day | ORAL | 1 refills | Status: DC
  Filled 2022-11-15: qty 30, 30d supply, fill #0

## 2022-11-15 MED ORDER — SPIRONOLACTONE 25 MG PO TABS
25.0000 mg | ORAL_TABLET | Freq: Every day | ORAL | 1 refills | Status: DC
  Filled 2022-11-15 – 2022-11-18 (×2): qty 30, 30d supply, fill #0
  Filled 2022-12-11 – 2022-12-15 (×2): qty 30, 30d supply, fill #1
  Filled 2023-01-08: qty 30, 30d supply, fill #2

## 2022-11-15 NOTE — Telephone Encounter (Addendum)
Pt is calling about refill on Spironolactone. She had requested a refill on 11/12/22. Sending request to Dr Jeneen Montgomery resident. Per chart rx was refilled for 11/18/22 and sent to [facility]. Pt wants rx sent to [facility] pharmacy.

## 2022-11-18 ENCOUNTER — Other Ambulatory Visit (HOSPITAL_COMMUNITY): Payer: Self-pay

## 2022-12-03 ENCOUNTER — Other Ambulatory Visit: Payer: Self-pay

## 2022-12-03 ENCOUNTER — Other Ambulatory Visit (HOSPITAL_COMMUNITY): Payer: Self-pay

## 2022-12-11 ENCOUNTER — Ambulatory Visit: Payer: Self-pay

## 2022-12-11 NOTE — Telephone Encounter (Signed)
Pt stated she is due for a blood pressure check and wanted to see if she could come in for a nurse visit for a blood pressure check. Explained to pt that we cannot schedule a nurse visit because she is not established with the office just yet new pt appointment at [facility]   Asked pt if she had any symptoms, she said no, but her BP is her biggest concern.   Seeking clinical advice on where to go or what to do while she gets in.    Chief Complaint: History of high BP and takes medication. No symptoms, but wants her BP checked. Symptoms: None Frequency: Today Pertinent Negatives: Patient denies  Disposition: [] ED /[] Urgent Care (no appt availability in office) / [] Appointment(In office/virtual)/ []  [facility]/ [] Home Care/ [] Refused Recommended Disposition /[x] [place]/ []  Follow-up with PCP Additional Notes: Pt. Will go to mobile unit today.  Reason for Disposition  Systolic BP  >= 180 OR Diastolic >= 110  Answer Assessment - Initial Assessment Questions 1. BLOOD PRESSURE: "What is the blood pressure?" "Did you take at least two measurements 5 minutes apart?"     None 2. ONSET: "When did you take your blood pressure?"     N/a 3. HOW: "How did you take your blood pressure?" (e.g., automatic home BP monitor, visiting nurse)     Nurse 4. HISTORY: "Do you have a history of high blood pressure?"     Yes 5. MEDICINES: "Are you taking any medicines for blood pressure?" "Have you missed any doses recently?"     Yes 6. OTHER SYMPTOMS: "Do you have any symptoms?" (e.g., blurred vision, chest pain, difficulty breathing, headache, weakness)     None 7. PREGNANCY: "Is there any chance you are pregnant?" "When was your last menstrual period?"     No  Protocols used: Blood Pressure - High-A-AH

## 2022-12-12 ENCOUNTER — Other Ambulatory Visit: Payer: Self-pay

## 2022-12-12 ENCOUNTER — Other Ambulatory Visit (HOSPITAL_COMMUNITY): Payer: Self-pay

## 2022-12-16 ENCOUNTER — Other Ambulatory Visit: Payer: Self-pay

## 2022-12-16 ENCOUNTER — Other Ambulatory Visit (HOSPITAL_COMMUNITY): Payer: Self-pay

## 2023-01-10 ENCOUNTER — Other Ambulatory Visit (HOSPITAL_COMMUNITY): Payer: Self-pay

## 2023-01-13 ENCOUNTER — Other Ambulatory Visit: Payer: Self-pay | Admitting: Student

## 2023-01-13 ENCOUNTER — Other Ambulatory Visit (HOSPITAL_COMMUNITY): Payer: Self-pay

## 2023-01-13 DIAGNOSIS — I10 Essential (primary) hypertension: Secondary | ICD-10-CM

## 2023-01-13 MED ORDER — LOSARTAN POTASSIUM-HCTZ 100-25 MG PO TABS
1.0000 | ORAL_TABLET | Freq: Every day | ORAL | 11 refills | Status: DC
  Filled 2023-01-13: qty 30, 30d supply, fill #0
  Filled 2023-02-07: qty 30, 30d supply, fill #1
  Filled 2023-03-10: qty 30, 30d supply, fill #2
  Filled 2023-04-08: qty 30, 30d supply, fill #3
  Filled 2023-05-10: qty 30, 30d supply, fill #4
  Filled 2023-06-09: qty 30, 30d supply, fill #5
  Filled 2023-07-07: qty 30, 30d supply, fill #6
  Filled 2023-08-11: qty 30, 30d supply, fill #7

## 2023-01-13 MED ORDER — AMLODIPINE BESYLATE 10 MG PO TABS
10.0000 mg | ORAL_TABLET | Freq: Every day | ORAL | 3 refills | Status: DC
  Filled 2023-01-13: qty 30, 30d supply, fill #0
  Filled 2023-02-07: qty 30, 30d supply, fill #1
  Filled 2023-03-10: qty 30, 30d supply, fill #2
  Filled 2023-04-08: qty 30, 30d supply, fill #3
  Filled 2023-05-10: qty 30, 30d supply, fill #4
  Filled 2023-06-09: qty 30, 30d supply, fill #5
  Filled 2023-07-07: qty 30, 30d supply, fill #6
  Filled 2023-08-11: qty 30, 30d supply, fill #7

## 2023-01-13 MED ORDER — CARVEDILOL 25 MG PO TABS
25.0000 mg | ORAL_TABLET | Freq: Two times a day (BID) | ORAL | 11 refills | Status: DC
  Filled 2023-01-13: qty 60, 30d supply, fill #0
  Filled 2023-02-07: qty 60, 30d supply, fill #1
  Filled 2023-03-10: qty 60, 30d supply, fill #2
  Filled 2023-04-08: qty 60, 30d supply, fill #3
  Filled 2023-05-10: qty 60, 30d supply, fill #4
  Filled 2023-06-09: qty 60, 30d supply, fill #5
  Filled 2023-07-07: qty 60, 30d supply, fill #6
  Filled 2023-08-11: qty 60, 30d supply, fill #7

## 2023-01-13 MED ORDER — SPIRONOLACTONE 25 MG PO TABS
25.0000 mg | ORAL_TABLET | Freq: Every day | ORAL | 11 refills | Status: DC
  Filled 2023-01-13: qty 30, 30d supply, fill #0
  Filled 2023-02-13: qty 30, 30d supply, fill #1
  Filled 2023-03-10: qty 30, 30d supply, fill #2
  Filled 2023-04-08: qty 30, 30d supply, fill #3
  Filled 2023-05-10: qty 30, 30d supply, fill #4
  Filled 2023-06-09: qty 30, 30d supply, fill #5
  Filled 2023-07-07: qty 30, 30d supply, fill #6
  Filled 2023-08-11: qty 30, 30d supply, fill #7

## 2023-01-27 ENCOUNTER — Ambulatory Visit: Payer: Medicaid Other | Admitting: Family Medicine

## 2023-01-28 ENCOUNTER — Ambulatory Visit (INDEPENDENT_AMBULATORY_CARE_PROVIDER_SITE_OTHER): Payer: 59 | Admitting: Family Medicine

## 2023-01-28 ENCOUNTER — Encounter: Payer: Self-pay | Admitting: Family Medicine

## 2023-01-28 VITALS — BP 129/87 | HR 62 | Temp 98.1°F | Resp 16 | Ht 62.0 in | Wt 191.8 lb

## 2023-01-28 DIAGNOSIS — F17210 Nicotine dependence, cigarettes, uncomplicated: Secondary | ICD-10-CM

## 2023-01-28 DIAGNOSIS — I10 Essential (primary) hypertension: Secondary | ICD-10-CM

## 2023-01-28 DIAGNOSIS — U070 Vaping-related disorder: Secondary | ICD-10-CM | POA: Diagnosis not present

## 2023-01-28 DIAGNOSIS — Z7689 Persons encountering health services in other specified circumstances: Secondary | ICD-10-CM

## 2023-01-28 DIAGNOSIS — F17200 Nicotine dependence, unspecified, uncomplicated: Secondary | ICD-10-CM

## 2023-01-28 DIAGNOSIS — Z6835 Body mass index (BMI) 35.0-35.9, adult: Secondary | ICD-10-CM | POA: Diagnosis not present

## 2023-01-28 DIAGNOSIS — E6609 Other obesity due to excess calories: Secondary | ICD-10-CM

## 2023-01-28 NOTE — Progress Notes (Signed)
New Patient Office Visit  Subjective    Patient ID: Patricia Sahara L Zabinski, female    DOB: Apr 16, 1976  Age: 47 y.o. MRN: [id number]  CC:  Chief Complaint  Patient presents with   Establish Care    HPI [name] presents to establish care and for review of chronic med issues. Patient denies acute complaints.    Outpatient Encounter Medications as of 01/28/2023  Medication Sig   amLODipine (NORVASC) 10 MG tablet Take 1 tablet (10 mg total) by mouth daily.   carvedilol (COREG) 25 MG tablet Take 1 tablet (25 mg total) by mouth 2 (two) times daily.   losartan-hydrochlorothiazide (HYZAAR) 100-25 MG tablet Take 1 tablet by mouth daily.   spironolactone (ALDACTONE) 25 MG tablet Take 1 tablet (25 mg total) by mouth daily.   [DISCONTINUED] benzonatate (TESSALON) 100 MG capsule Take 1 capsule (100 mg total) by mouth 3 (three) times daily as needed.   [DISCONTINUED] fluconazole (DIFLUCAN) 150 MG tablet Take 1 tablet (150 mg total) by mouth daily.   [DISCONTINUED] fluticasone (FLONASE) 50 MCG/ACT nasal spray Place 2 sprays into both nostrils daily.   [DISCONTINUED] guaiFENesin (MUCINEX) 600 MG 12 hr tablet Take 1 tablet (600 mg total) by mouth 2 (two) times daily as needed for to loosen phlegm or cough.   [DISCONTINUED] levocetirizine (XYZAL) 5 MG tablet Take 1 tablet (5 mg total) by mouth at bedtime.   [DISCONTINUED] nicotine (NICODERM CQ - DOSED IN MG/24 HOURS) 21 mg/24hr patch Place 1 patch (21 mg total) onto the skin daily.   [DISCONTINUED] nicotine polacrilex (NICORETTE) 4 MG gum Use 1 piece of gum (4 mg total) by mouth as needed for smoking cessation.   [DISCONTINUED] promethazine-dextromethorphan (PROMETHAZINE-DM) 6.25-15 MG/5ML syrup Take 5 mLs by mouth 4 (four) times daily as needed for cough. BEST TO TAKE ONLY AT BEDTIME   No facility-administered encounter medications on file as of 01/28/2023.    Past Medical History:  Diagnosis Date   Abscess of left axilla 12/03/2019   Presents  with 4d of painful abscess under L axilla after shaving. No fever or chills. It has not drained. On exam, there is a small area of draining pus surrounded by 1cm area of fluctuance and erythema, surrounded by a larger indurated area. Not significantly tender. No tracking noted. Small amount of pus expressed.   Essential hypertension 04/27/2007   Qualifier: Diagnosis of  By: Deirdre Peer MD, Erin     Hepatitis C    HFrEF (heart failure with reduced ejection fraction) (HCC)    Hypertension    Left breast mass 07/22/2019    Past Surgical History:  Procedure Laterality Date   LEFT AND RIGHT HEART CATHETERIZATION WITH CORONARY ANGIOGRAM N/A 06/23/2013   Procedure: LEFT AND RIGHT HEART CATHETERIZATION WITH CORONARY ANGIOGRAM;  Surgeon: Ricki Rodriguez, MD;  Location: [facility];  Service: Cardiovascular;  Laterality: N/A;   LIVER BIOPSY     LIVER BIOPSY      Family History  Problem Relation Age of Onset   Hypertension Mother    Hyperlipidemia Mother    Diabetes Mother    Hepatitis C Mother    Diabetes Maternal Aunt     Social History   Socioeconomic History   Marital status: Single    Spouse name: Not on file   Number of children: Not on file   Years of education: Not on file   Highest education level: 11th grade  Occupational History   Not on file  Tobacco Use  Smoking status: Some Days    Current packs/day: 0.50    Types: Cigarettes   Smokeless tobacco: Never   Tobacco comments:    6-7 per day ./  has Vaped   Substance and Sexual Activity   Alcohol use: Yes    Alcohol/week: 28.0 standard drinks of alcohol    Types: 28 Standard drinks or equivalent per week   Drug use: Yes    Frequency: 0.5 times per week    Types: Marijuana   Sexual activity: Not on file  Other Topics Concern   Not on file  Social History Narrative   Not on file   Social Determinants of Health   Financial Resource Strain: Low Risk  (01/24/2023)   Overall Financial Resource Strain (CARDIA)    Difficulty  of Paying Living Expenses: Not very hard  Food Insecurity: Food Insecurity Present (01/24/2023)   Hunger Vital Sign    Worried About Running Out of Food in the Last Year: Sometimes true    Ran Out of Food in the Last Year: Often true  Transportation Needs: Unmet Transportation Needs (01/24/2023)   PRAPARE - Administrator, Civil Service (Medical): Yes    Lack of Transportation (Non-Medical): Yes  Physical Activity: Inactive (01/24/2023)   Exercise Vital Sign    Days of Exercise per Week: 5 days    Minutes of Exercise per Session: 0 min  Stress: No Stress Concern Present (01/24/2023)   [employer] of Occupational Health - Occupational Stress Questionnaire    Feeling of Stress : Not at all  Social Connections: Moderately Integrated (01/24/2023)   Social Connection and Isolation Panel [NHANES]    Frequency of Communication with Friends and Family: More than three times a week    Frequency of Social Gatherings with Friends and Family: Twice a week    Attends Religious Services: 1 to 4 times per year    Active Member of [employer] or Organizations: No    Attends Engineer, structural: Not on file    Marital Status: Living with partner  Intimate Partner Violence: Not on file    Review of Systems  All other systems reviewed and are negative.       Objective    BP 129/87   Pulse 62   Temp 98.1 F (36.7 C) (Oral)   Resp 16   Ht 5\' 2"  (1.575 m)   Wt 191 lb 12.8 oz (87 kg)   SpO2 96%   BMI 35.08 kg/m   Physical Exam Vitals and nursing note reviewed.  Constitutional:      General: She is not in acute distress.    Appearance: She is obese.  Cardiovascular:     Rate and Rhythm: Normal rate and regular rhythm.  Pulmonary:     Effort: Pulmonary effort is normal.     Breath sounds: Normal breath sounds.  Abdominal:     Palpations: Abdomen is soft.     Tenderness: There is no abdominal tenderness.  Neurological:     General: No focal deficit present.      Mental Status: She is alert and oriented to person, place, and time.  Psychiatric:        Mood and Affect: Mood normal.        Behavior: Behavior normal.         Assessment & Plan:  1. Essential hypertension Appears stable. Monitoring labs ordered. Continue  - Basic Metabolic Panel - Lipid Panel  2. Tobacco use disorder Discussed reduction/cessation  3. Encounter to establish care     Return in about 6 months (around 07/29/2023) for follow up.   Tommie Raymond, MD

## 2023-01-29 LAB — BASIC METABOLIC PANEL
BUN/Creatinine Ratio: 18 (ref 9–23)
BUN: 16 mg/dL (ref 6–24)
CO2: 24 mmol/L (ref 20–29)
Calcium: 9.3 mg/dL (ref 8.7–10.2)
Chloride: 102 mmol/L (ref 96–106)
Creatinine, Ser: 0.88 mg/dL (ref 0.57–1.00)
Glucose: 91 mg/dL (ref 70–99)
Potassium: 4.8 mmol/L (ref 3.5–5.2)
Sodium: 140 mmol/L (ref 134–144)
eGFR: 81 mL/min/{1.73_m2} (ref >59)

## 2023-01-29 LAB — LIPID PANEL
Chol/HDL Ratio: 3.3 {ratio} (ref 0.0–4.4)
Cholesterol, Total: 152 mg/dL (ref 100–199)
HDL: 46 mg/dL (ref >39)
LDL Chol Calc (NIH): 82 mg/dL (ref 0–99)
Triglycerides: 133 mg/dL (ref 0–149)
VLDL Cholesterol Cal: 24 mg/dL (ref 5–40)

## 2023-02-07 ENCOUNTER — Other Ambulatory Visit (HOSPITAL_COMMUNITY): Payer: Self-pay

## 2023-02-07 ENCOUNTER — Other Ambulatory Visit: Payer: Self-pay

## 2023-02-13 ENCOUNTER — Other Ambulatory Visit: Payer: Self-pay

## 2023-02-14 ENCOUNTER — Other Ambulatory Visit (HOSPITAL_COMMUNITY): Payer: Self-pay

## 2023-03-10 ENCOUNTER — Other Ambulatory Visit (HOSPITAL_COMMUNITY): Payer: Self-pay

## 2023-03-11 ENCOUNTER — Other Ambulatory Visit (HOSPITAL_COMMUNITY): Payer: Self-pay

## 2023-03-14 ENCOUNTER — Other Ambulatory Visit (HOSPITAL_COMMUNITY): Payer: Self-pay

## 2023-03-18 ENCOUNTER — Encounter: Payer: 59 | Admitting: Family Medicine

## 2023-03-25 ENCOUNTER — Encounter: Payer: 59 | Admitting: Family Medicine

## 2023-03-25 ENCOUNTER — Encounter: Payer: Self-pay | Admitting: Family Medicine

## 2023-04-07 ENCOUNTER — Other Ambulatory Visit: Payer: Self-pay | Admitting: Family Medicine

## 2023-04-07 DIAGNOSIS — Z1231 Encounter for screening mammogram for malignant neoplasm of breast: Secondary | ICD-10-CM

## 2023-04-08 ENCOUNTER — Other Ambulatory Visit: Payer: Self-pay

## 2023-04-18 ENCOUNTER — Ambulatory Visit: Payer: Self-pay

## 2023-04-18 NOTE — Telephone Encounter (Signed)
Scheduled

## 2023-04-18 NOTE — Telephone Encounter (Signed)
Message from [place] B sent at 04/18/2023 11:18 AM EST  Summary: dizziness   Pt has dizziness going on         Chief Complaint: episode of vertigo/dizziness  Symptoms: pt sat down then bent over to clean trash can and when she sat back, felt like she was going to "pass out" Felt like her eyes were moving but later stated it felt like either her or the room was spinning. Frequency: this am  Pertinent Negatives: Patient denies weakness or numbness to the face, arms, legs, vision or speech problems. Able to walk without problem. Recent ear discomfort that has subsdided. Disposition: [] ED /[] Urgent Care (no appt availability in office) / [] Appointment(In office/virtual)/ []  [facility]/ [] Home Care/ [] Refused Recommended Disposition /[] [facility]/ [x]  Follow-up with PCP Additional Notes: No appt available. Pt wanting to be re-evaluated for this.   Reason for Disposition  [1] MODERATE dizziness (e.g., interferes with normal activities) AND [2] has been evaluated by doctor (or NP/PA) for this  Answer Assessment - Initial Assessment Questions 1. DESCRIPTION: "Describe your dizziness."     Head gets light and eyes start moving and try to close. Was sitting. Last 1-2 minutes feels like passing  2. LIGHTHEADED: "Do you feel lightheaded?" (e.g., somewhat faint, woozy, weak upon standing)     Was sat down Bent head cleaning out trash out  3. VERTIGO: "Do you feel like either you or the room is spinning or tilting?" (i.e. vertigo)     Yes room spinning  4. SEVERITY: "How bad is it?"  "Do you feel like you are going to faint?" "Can you stand and walk?"   - MILD: Feels slightly dizzy, but walking normally.   - MODERATE: Feels unsteady when walking, but not falling; interferes with normal activities (e.g., school, work).   - SEVERE: Unable to walk without falling, or requires assistance to walk without falling; feels like passing out now.      Mild was sitting  5. ONSET:   "When did the dizziness begin?"     This am has a h/o dizziness 6. AGGRAVATING FACTORS: "Does anything make it worse?" (e.g., standing, change in head position)     Change in position  9. RECURRENT SYMPTOM: "Have you had dizziness before?" If Yes, ask: "When was the last time?" "What happened that time?"     yes 10. OTHER SYMPTOMS: "Do you have any other symptoms?" (e.g., fever, chest pain, vomiting, diarrhea, bleeding)       Right ear felt like it getting infected  Protocols used: Dizziness - Lightheadedness-A-AH

## 2023-05-06 DIAGNOSIS — Z1231 Encounter for screening mammogram for malignant neoplasm of breast: Secondary | ICD-10-CM

## 2023-05-12 ENCOUNTER — Other Ambulatory Visit: Payer: Self-pay

## 2023-05-13 ENCOUNTER — Encounter: Payer: Self-pay | Admitting: Family Medicine

## 2023-05-13 ENCOUNTER — Ambulatory Visit (INDEPENDENT_AMBULATORY_CARE_PROVIDER_SITE_OTHER): Payer: 59 | Admitting: Family Medicine

## 2023-05-13 ENCOUNTER — Ambulatory Visit: Payer: 59

## 2023-05-13 VITALS — BP 121/84 | HR 65 | Temp 98.9°F | Resp 16 | Ht 62.0 in | Wt 194.0 lb

## 2023-05-13 DIAGNOSIS — N760 Acute vaginitis: Secondary | ICD-10-CM | POA: Diagnosis not present

## 2023-05-13 DIAGNOSIS — N951 Menopausal and female climacteric states: Secondary | ICD-10-CM

## 2023-05-13 DIAGNOSIS — I10 Essential (primary) hypertension: Secondary | ICD-10-CM

## 2023-05-13 DIAGNOSIS — F17200 Nicotine dependence, unspecified, uncomplicated: Secondary | ICD-10-CM

## 2023-05-13 DIAGNOSIS — R42 Dizziness and giddiness: Secondary | ICD-10-CM | POA: Diagnosis not present

## 2023-05-15 ENCOUNTER — Encounter: Payer: Self-pay | Admitting: Family Medicine

## 2023-05-15 NOTE — Progress Notes (Signed)
 Established Patient Office Visit  Subjective    Patient ID: [name], female    DOB: Sep 04, 1975  Age: 48 y.o. MRN: [id number]  CC:  Chief Complaint  Patient presents with   Dizziness    B/V    HPI [name] presents with complaint of intermittent dizziness when changing positions. She works in housekeeping and reports that she does not drink but minimal water daily. She denies CV sx. She also some menstrual irregularities and hot flashes. She is concerned about recurrent BV although she denies sx presently.   Outpatient Encounter Medications as of 05/13/2023  Medication Sig   amLODipine  (NORVASC ) 10 MG tablet Take 1 tablet (10 mg total) by mouth daily.   carvedilol  (COREG ) 25 MG tablet Take 1 tablet (25 mg total) by mouth 2 (two) times daily.   losartan -hydrochlorothiazide  (HYZAAR ) 100-25 MG tablet Take 1 tablet by mouth daily.   spironolactone  (ALDACTONE ) 25 MG tablet Take 1 tablet (25 mg total) by mouth daily.   No facility-administered encounter medications on file as of 05/13/2023.    Past Medical History:  Diagnosis Date   Abscess of left axilla 12/03/2019   Presents with 4d of painful abscess under L axilla after shaving. No fever or chills. It has not drained. On exam, there is a small area of draining pus surrounded by 1cm area of fluctuance and erythema, surrounded by a larger indurated area. Not significantly tender. No tracking noted. Small amount of pus expressed.   Essential hypertension 04/27/2007   Qualifier: Diagnosis of  By: Jonelle MD, Erin     Hepatitis C    HFrEF (heart failure with reduced ejection fraction) (HCC)    Hypertension    Left breast mass 07/22/2019    Past Surgical History:  Procedure Laterality Date   LEFT AND RIGHT HEART CATHETERIZATION WITH CORONARY ANGIOGRAM N/A 06/23/2013   Procedure: LEFT AND RIGHT HEART CATHETERIZATION WITH CORONARY ANGIOGRAM;  Surgeon: Salena GORMAN Negri, MD;  Location: [facility];  Service: Cardiovascular;   Laterality: N/A;   LIVER BIOPSY     LIVER BIOPSY      Family History  Problem Relation Age of Onset   Hypertension Mother    Hyperlipidemia Mother    Diabetes Mother    Hepatitis C Mother    Diabetes Maternal Aunt     Social History   Socioeconomic History   Marital status: Single    Spouse name: Not on file   Number of children: 6   Years of education: Not on file   Highest education level: 11th grade  Occupational History   Not on file  Tobacco Use   Smoking status: Some Days    Current packs/day: 0.50    Types: Cigarettes   Smokeless tobacco: Never   Tobacco comments:    6-7 per day ./  has Vaped   Vaping Use   Vaping status: Never Used  Substance and Sexual Activity   Alcohol use: Yes    Alcohol/week: 28.0 standard drinks of alcohol    Types: 28 Standard drinks or equivalent per week   Drug use: Yes    Frequency: 0.5 times per week    Types: Marijuana   Sexual activity: Not on file  Other Topics Concern   Not on file  Social History Narrative   Not on file   Social Drivers of Health   Financial Resource Strain: High Risk (05/12/2023)   Overall Financial Resource Strain (CARDIA)    Difficulty of Paying Living  Expenses: Hard  Food Insecurity: Food Insecurity Present (05/12/2023)   Hunger Vital Sign    Worried About Running Out of Food in the Last Year: Sometimes true    Ran Out of Food in the Last Year: Sometimes true  Transportation Needs: No Transportation Needs (05/12/2023)   PRAPARE - Administrator, Civil Service (Medical): No    Lack of Transportation (Non-Medical): No  Physical Activity: Inactive (05/12/2023)   Exercise Vital Sign    Days of Exercise per Week: 0 days    Minutes of Exercise per Session: 0 min  Stress: No Stress Concern Present (05/12/2023)   [employer] of Occupational Health - Occupational Stress Questionnaire    Feeling of Stress : Not at all  Social Connections: Moderately Integrated (05/12/2023)   Social  Connection and Isolation Panel [NHANES]    Frequency of Communication with Friends and Family: More than three times a week    Frequency of Social Gatherings with Friends and Family: Once a week    Attends Religious Services: 1 to 4 times per year    Active Member of [employer] or Organizations: No    Attends Banker Meetings: Not on file    Marital Status: Living with partner  Intimate Partner Violence: Not At Risk (05/13/2023)   Humiliation, Afraid, Rape, and Kick questionnaire    Fear of Current or Ex-Partner: No    Emotionally Abused: No    Physically Abused: No    Sexually Abused: No    Review of Systems  Neurological:  Positive for dizziness.  All other systems reviewed and are negative.       Objective    BP 121/84 (BP Location: Right Arm, Patient Position: Sitting, Cuff Size: Normal)   Pulse 65   Temp 98.9 F (37.2 C) (Oral)   Resp 16   Ht 5' 2 (1.575 m)   Wt 194 lb (88 kg)   SpO2 97%   BMI 35.48 kg/m   Physical Exam Vitals and nursing note reviewed.  Constitutional:      General: She is not in acute distress. HENT:     Head: Normocephalic and atraumatic.  Cardiovascular:     Rate and Rhythm: Normal rate and regular rhythm.  Pulmonary:     Effort: Pulmonary effort is normal.     Breath sounds: Normal breath sounds.  Abdominal:     Palpations: Abdomen is soft.     Tenderness: There is no abdominal tenderness.  Neurological:     General: No focal deficit present.     Mental Status: She is alert and oriented to person, place, and time.         Assessment & Plan:   1. Dizziness and giddiness (Primary) Patient encouraged to consume adequate and appropriate fluids daily. Patient deferred further eval/mgt at this time  2. Perimenopausal Reassurance given.   3. Recurrent vaginitis Reassurance given.  Care and Activity options discussed.   4. Essential hypertension Appears stable. Continue   5. Tobacco use disorder Discussed  cessation/reduction     Return for physical.   Tanda Raguel SQUIBB, MD

## 2023-06-03 ENCOUNTER — Ambulatory Visit
Admission: RE | Admit: 2023-06-03 | Discharge: 2023-06-03 | Disposition: A | Discharge status: Home / Self Care | Payer: 59 | Source: Ambulatory Visit | Attending: Family Medicine | Admitting: Family Medicine

## 2023-06-03 DIAGNOSIS — Z1231 Encounter for screening mammogram for malignant neoplasm of breast: Secondary | ICD-10-CM | POA: Diagnosis not present

## 2023-06-09 ENCOUNTER — Other Ambulatory Visit (HOSPITAL_COMMUNITY): Payer: Self-pay

## 2023-06-11 ENCOUNTER — Other Ambulatory Visit (HOSPITAL_COMMUNITY): Payer: Self-pay

## 2023-07-07 ENCOUNTER — Other Ambulatory Visit (HOSPITAL_COMMUNITY): Payer: Self-pay

## 2023-07-08 ENCOUNTER — Ambulatory Visit (INDEPENDENT_AMBULATORY_CARE_PROVIDER_SITE_OTHER): Payer: 59 | Admitting: Family Medicine

## 2023-07-08 VITALS — BP 123/76 | HR 67 | Temp 98.4°F | Resp 16 | Ht 62.0 in | Wt 194.0 lb

## 2023-07-08 DIAGNOSIS — Z1329 Encounter for screening for other suspected endocrine disorder: Secondary | ICD-10-CM

## 2023-07-08 DIAGNOSIS — Z13220 Encounter for screening for lipoid disorders: Secondary | ICD-10-CM

## 2023-07-08 DIAGNOSIS — Z130 Encounter for screening for diseases of the blood and blood-forming organs and certain disorders involving the immune mechanism: Secondary | ICD-10-CM

## 2023-07-08 DIAGNOSIS — Z0000 Encounter for general adult medical examination without abnormal findings: Secondary | ICD-10-CM

## 2023-07-08 DIAGNOSIS — Z1211 Encounter for screening for malignant neoplasm of colon: Secondary | ICD-10-CM | POA: Diagnosis not present

## 2023-07-08 DIAGNOSIS — Z13228 Encounter for screening for other metabolic disorders: Secondary | ICD-10-CM | POA: Diagnosis not present

## 2023-07-08 NOTE — Progress Notes (Unsigned)
 -  Patient is here to have annually  complete physical examination  -Care gap address -labs taken

## 2023-07-09 ENCOUNTER — Ambulatory Visit: Payer: Self-pay | Admitting: Family Medicine

## 2023-07-09 ENCOUNTER — Telehealth: Payer: Self-pay | Admitting: Family Medicine

## 2023-07-09 LAB — CMP14+EGFR
ALT: 11 IU/L (ref 0–32)
AST: 15 IU/L (ref 0–40)
Albumin: 4.5 g/dL (ref 3.9–4.9)
Alkaline Phosphatase: 52 IU/L (ref 44–121)
BUN/Creatinine Ratio: 13 (ref 9–23)
BUN: 9 mg/dL (ref 6–24)
Bilirubin Total: 0.3 mg/dL (ref 0.0–1.2)
CO2: 22 mmol/L (ref 20–29)
Calcium: 9.7 mg/dL (ref 8.7–10.2)
Chloride: 98 mmol/L (ref 96–106)
Creatinine, Ser: 0.69 mg/dL (ref 0.57–1.00)
Globulin, Total: 2.9 g/dL (ref 1.5–4.5)
Glucose: 87 mg/dL (ref 70–99)
Potassium: 4.4 mmol/L (ref 3.5–5.2)
Sodium: 135 mmol/L (ref 134–144)
Total Protein: 7.4 g/dL (ref 6.0–8.5)
eGFR: 107 mL/min/{1.73_m2} (ref >59)

## 2023-07-09 LAB — CBC WITH DIFFERENTIAL/PLATELET
Basophils Absolute: 0 10*3/uL (ref 0.0–0.2)
Basos: 1 %
EOS (ABSOLUTE): 0.1 10*3/uL (ref 0.0–0.4)
Eos: 1 %
Hematocrit: 38.3 % (ref 34.0–46.6)
Hemoglobin: 13 g/dL (ref 11.1–15.9)
Immature Grans (Abs): 0 10*3/uL (ref 0.0–0.1)
Immature Granulocytes: 0 %
Lymphocytes Absolute: 2.6 10*3/uL (ref 0.7–3.1)
Lymphs: 32 %
MCH: 28.7 pg (ref 26.6–33.0)
MCHC: 33.9 g/dL (ref 31.5–35.7)
MCV: 85 fL (ref 79–97)
Monocytes Absolute: 0.5 10*3/uL (ref 0.1–0.9)
Monocytes: 7 %
Neutrophils Absolute: 4.8 10*3/uL (ref 1.4–7.0)
Neutrophils: 59 %
Platelets: 285 10*3/uL (ref 150–450)
RBC: 4.53 x10E6/uL (ref 3.77–5.28)
RDW: 13.1 % (ref 11.7–15.4)
WBC: 8 10*3/uL (ref 3.4–10.8)

## 2023-07-09 LAB — LIPID PANEL
Chol/HDL Ratio: 3.2 ratio (ref 0.0–4.4)
Cholesterol, Total: 165 mg/dL (ref 100–199)
HDL: 52 mg/dL (ref >39)
LDL Chol Calc (NIH): 85 mg/dL (ref 0–99)
Triglycerides: 166 mg/dL — ABNORMAL HIGH (ref 0–149)
VLDL Cholesterol Cal: 28 mg/dL (ref 5–40)

## 2023-07-09 LAB — TSH: TSH: 0.884 u[IU]/mL (ref 0.450–4.500)

## 2023-07-09 LAB — HEMOGLOBIN A1C
Est. average glucose Bld gHb Est-mCnc: 126 mg/dL
Hgb A1c MFr Bld: 6 % — ABNORMAL HIGH (ref 4.8–5.6)

## 2023-07-09 NOTE — Telephone Encounter (Signed)
 Copied from CRM [id number]. Topic: Clinical - Lab/Test Results >> Jul 09, 2023 12:15 PM [name] wrote: Reason for CRM: pt received letter about lab results and has questions

## 2023-07-09 NOTE — Telephone Encounter (Signed)
 Patient called in requesting interpretation of lab results. Results have not yet been reviewed by her provider. This RN advised that I would route this conversation to the clinic for their discretion. Patient is requesting a callback, once lab results have been interpreted.   Reason for Disposition  [1] Caller requesting NON-URGENT health information AND [2] PCP's office is the best resource  Protocols used: Information Only Call - No Triage-A-AH

## 2023-07-10 ENCOUNTER — Encounter: Payer: Self-pay | Admitting: Family Medicine

## 2023-07-10 NOTE — Progress Notes (Signed)
 Established Patient Office Visit  Subjective    Patient ID: [name], female    DOB: 1975/06/30  Age: 48 y.o. MRN: [id number]  CC: No chief complaint on file.   HPI [name] presents for routine annual exam. Patient denies acute complaints and concerns.   Outpatient Encounter Medications as of 07/08/2023  Medication Sig   amLODipine (NORVASC) 10 MG tablet Take 1 tablet (10 mg total) by mouth daily.   carvedilol (COREG) 25 MG tablet Take 1 tablet (25 mg total) by mouth 2 (two) times daily.   losartan-hydrochlorothiazide (HYZAAR) 100-25 MG tablet Take 1 tablet by mouth daily.   spironolactone (ALDACTONE) 25 MG tablet Take 1 tablet (25 mg total) by mouth daily.   No facility-administered encounter medications on file as of 07/08/2023.    Past Medical History:  Diagnosis Date   Abscess of left axilla 12/03/2019   Presents with 4d of painful abscess under L axilla after shaving. No fever or chills. It has not drained. On exam, there is a small area of draining pus surrounded by 1cm area of fluctuance and erythema, surrounded by a larger indurated area. Not significantly tender. No tracking noted. Small amount of pus expressed.   Essential hypertension 04/27/2007   Qualifier: Diagnosis of  By: Deirdre Peer MD, Erin     Hepatitis C    HFrEF (heart failure with reduced ejection fraction) (HCC)    Hypertension    Left breast mass 07/22/2019    Past Surgical History:  Procedure Laterality Date   LEFT AND RIGHT HEART CATHETERIZATION WITH CORONARY ANGIOGRAM N/A 06/23/2013   Procedure: LEFT AND RIGHT HEART CATHETERIZATION WITH CORONARY ANGIOGRAM;  Surgeon: Ricki Rodriguez, MD;  Location: [facility];  Service: Cardiovascular;  Laterality: N/A;   LIVER BIOPSY     LIVER BIOPSY      Family History  Problem Relation Age of Onset   Hypertension Mother    Hyperlipidemia Mother    Diabetes Mother    Hepatitis C Mother    Diabetes Maternal Aunt     Social History   Socioeconomic  History   Marital status: Single    Spouse name: Not on file   Number of children: 6   Years of education: Not on file   Highest education level: 11th grade  Occupational History   Not on file  Tobacco Use   Smoking status: Some Days    Current packs/day: 0.50    Types: Cigarettes   Smokeless tobacco: Never   Tobacco comments:    6-7 per day ./  has Vaped   Vaping Use   Vaping status: Never Used  Substance and Sexual Activity   Alcohol use: Yes    Alcohol/week: 28.0 standard drinks of alcohol    Types: 28 Standard drinks or equivalent per week   Drug use: Yes    Frequency: 0.5 times per week    Types: Marijuana   Sexual activity: Not on file  Other Topics Concern   Not on file  Social History Narrative   Not on file   Social Drivers of Health   Financial Resource Strain: High Risk (05/12/2023)   Overall Financial Resource Strain (CARDIA)    Difficulty of Paying Living Expenses: Hard  Food Insecurity: Food Insecurity Present (05/12/2023)   Hunger Vital Sign    Worried About Running Out of Food in the Last Year: Sometimes true    Ran Out of Food in the Last Year: Sometimes true  Transportation Needs: No  Transportation Needs (05/12/2023)   PRAPARE - Administrator, Civil Service (Medical): No    Lack of Transportation (Non-Medical): No  Physical Activity: Inactive (05/12/2023)   Exercise Vital Sign    Days of Exercise per Week: 0 days    Minutes of Exercise per Session: 0 min  Stress: No Stress Concern Present (05/12/2023)   [employer] of Occupational Health - Occupational Stress Questionnaire    Feeling of Stress : Not at all  Social Connections: Moderately Integrated (05/12/2023)   Social Connection and Isolation Panel [NHANES]    Frequency of Communication with Friends and Family: More than three times a week    Frequency of Social Gatherings with Friends and Family: Once a week    Attends Religious Services: 1 to 4 times per year    Active Member of  [employer] or Organizations: No    Attends Banker Meetings: Not on file    Marital Status: Living with partner  Intimate Partner Violence: Not At Risk (05/13/2023)   Humiliation, Afraid, Rape, and Kick questionnaire    Fear of Current or Ex-Partner: No    Emotionally Abused: No    Physically Abused: No    Sexually Abused: No    Review of Systems  All other systems reviewed and are negative.       Objective    BP 123/76   Pulse 67   Temp 98.4 F (36.9 C) (Oral)   Resp 16   Ht 5\' 2"  (1.575 m)   Wt 194 lb (88 kg)   SpO2 95%   BMI 35.48 kg/m   Physical Exam Vitals and nursing note reviewed.  Constitutional:      General: She is not in acute distress.    Appearance: She is obese.  HENT:     Head: Normocephalic and atraumatic.     Right Ear: Tympanic membrane, ear canal and external ear normal.     Left Ear: Tympanic membrane, ear canal and external ear normal.     Nose: Nose normal.     Mouth/Throat:     Mouth: Mucous membranes are moist.     Pharynx: Oropharynx is clear.  Eyes:     Conjunctiva/sclera: Conjunctivae normal.     Pupils: Pupils are equal, round, and reactive to light.  Neck:     Thyroid: No thyromegaly.  Cardiovascular:     Rate and Rhythm: Normal rate and regular rhythm.     Heart sounds: Normal heart sounds. No murmur heard. Pulmonary:     Effort: Pulmonary effort is normal. No respiratory distress.     Breath sounds: Normal breath sounds.  Abdominal:     General: There is no distension.     Palpations: Abdomen is soft. There is no mass.     Tenderness: There is no abdominal tenderness.  Musculoskeletal:        General: Normal range of motion.     Cervical back: Normal range of motion and neck supple.  Skin:    General: Skin is warm and dry.  Neurological:     General: No focal deficit present.     Mental Status: She is alert and oriented to person, place, and time.  Psychiatric:        Mood and Affect: Mood normal.         Behavior: Behavior normal.         Assessment & Plan:   Annual physical exam -     CMP14+EGFR  Screening for  deficiency anemia -     CBC with Differential/Platelet  Screening for lipid disorders -     Lipid panel  Screening for colon cancer -     Cologuard  Screening for endocrine/metabolic/immunity disorders -     TSH -     Hemoglobin A1c     Return in about 6 months (around 01/08/2024) for follow up, chronic med issues.   Tommie Raymond, MD

## 2023-07-10 NOTE — Telephone Encounter (Signed)
 Not a pt at [facility]

## 2023-07-10 NOTE — Telephone Encounter (Signed)
 I returned patient call and made her aware what MD recommendation were  Patient is prediabetic. Recommend monitoring diet and activity and rechecking A1c in 6-12 months.

## 2023-07-23 ENCOUNTER — Encounter: Payer: Self-pay | Admitting: Family Medicine

## 2023-07-24 ENCOUNTER — Other Ambulatory Visit: Payer: Self-pay | Admitting: Family Medicine

## 2023-07-25 ENCOUNTER — Other Ambulatory Visit (HOSPITAL_COMMUNITY): Payer: Self-pay

## 2023-07-25 ENCOUNTER — Other Ambulatory Visit: Payer: Self-pay | Admitting: Family Medicine

## 2023-07-25 MED ORDER — TRIAMCINOLONE ACETONIDE 0.5 % EX OINT
1.0000 | TOPICAL_OINTMENT | Freq: Two times a day (BID) | CUTANEOUS | 0 refills | Status: DC
  Filled 2023-07-25 – 2023-08-11 (×2): qty 30, 15d supply, fill #0

## 2023-08-01 ENCOUNTER — Telehealth: Admitting: Physician Assistant

## 2023-08-01 DIAGNOSIS — J4521 Mild intermittent asthma with (acute) exacerbation: Secondary | ICD-10-CM

## 2023-08-01 DIAGNOSIS — J302 Other seasonal allergic rhinitis: Secondary | ICD-10-CM | POA: Diagnosis not present

## 2023-08-01 MED ORDER — CETIRIZINE HCL 10 MG PO TABS: 10.0000 mg | ORAL_TABLET | Freq: Every day | ORAL | 11 refills | Status: DC

## 2023-08-01 MED ORDER — ALBUTEROL SULFATE HFA 108 (90 BASE) MCG/ACT IN AERS: 1.0000 | INHALATION_SPRAY | Freq: Four times a day (QID) | RESPIRATORY_TRACT | 0 refills | Status: DC | PRN

## 2023-08-01 MED ORDER — FLUTICASONE PROPIONATE 50 MCG/ACT NA SUSP: 2.0000 | Freq: Every day | NASAL | 0 refills | Status: DC

## 2023-08-01 NOTE — Progress Notes (Signed)
 E visit for Allergic Rhinitis We are sorry that you are not feeling well.  Here is how we plan to help!  Based on what you have shared with me it looks like you have Allergic Rhinitis.  Rhinitis is when a reaction occurs that causes nasal congestion, runny nose, sneezing, and itching.  Most types of rhinitis are caused by an inflammation and are associated with symptoms in the eyes ears or throat. There are several types of rhinitis.  The most common are acute rhinitis, which is usually caused by a viral illness, allergic or seasonal rhinitis, and nonallergic or year-round rhinitis.  Nasal allergies occur certain times of the year.  Allergic rhinitis is caused when allergens in the air trigger the release of histamine in the body.  Histamine causes itching, swelling, and fluid to build up in the fragile linings of the nasal passages, sinuses and eyelids.  An itchy nose and clear discharge are common.  I recommend the following over the counter treatments: You should take a daily dose of antihistamine, I have prescribed Cetirizine 10mg  Take 1 tablet daily  I also would recommend a nasal spray: I have prescribed Flonase 2 sprays into each nostril once daily for up to 30 days  I have also prescribed an Albuterol inhaler Use 1-2 puffs every 6 hours as needed for shortness of breath, chest tightness, and/or wheezing.   HOME CARE:  You can use an over-the-counter saline nasal spray as needed Avoid areas where there is heavy dust, mites, or molds Stay indoors on windy days during the pollen season Keep windows closed in home, at least in bedroom; use air conditioner. Use high-efficiency house air filter Keep windows closed in car, turn AC on re-circulate Avoid playing out with dog during pollen season  GET HELP RIGHT AWAY IF:  If your symptoms do not improve within 10 days You become short of breath You develop yellow or green discharge from your nose for over 3 days You have coughing  fits  MAKE SURE YOU:  Understand these instructions Will watch your condition Will get help right away if you are not doing well or get worse  Thank you for choosing an e-visit. Your e-visit answers were reviewed by a board certified advanced clinical practitioner to complete your personal care plan. Depending upon the condition, your plan could have included both over the counter or prescription medications. Please review your pharmacy choice. Be sure that the pharmacy you have chosen is open so that you can pick up your prescription now.  If there is a problem you may message your provider in MyChart to have the prescription routed to another pharmacy. Your safety is important to [place]. If you have drug allergies check your prescription carefully.  For the next 24 hours, you can use MyChart to ask questions about today's visit, request a non-urgent call back, or ask for a work or school excuse from your e-visit provider. You will get an email in the next two days asking about your experience. I hope that your e-visit has been valuable and will speed your recovery.       I have spent 5 minutes in review of e-visit questionnaire, review and updating patient chart, medical decision making and response to patient.   Margaretann Loveless, PA-C

## 2023-08-06 ENCOUNTER — Other Ambulatory Visit (HOSPITAL_COMMUNITY): Payer: Self-pay

## 2023-08-12 ENCOUNTER — Other Ambulatory Visit (HOSPITAL_COMMUNITY): Payer: Self-pay

## 2023-08-13 ENCOUNTER — Other Ambulatory Visit (HOSPITAL_COMMUNITY): Payer: Self-pay

## 2023-08-13 ENCOUNTER — Other Ambulatory Visit: Payer: Self-pay | Admitting: Family Medicine

## 2023-08-13 ENCOUNTER — Encounter: Payer: Self-pay | Admitting: Family Medicine

## 2023-08-20 ENCOUNTER — Ambulatory Visit: Payer: 59 | Admitting: Family Medicine

## 2023-09-02 ENCOUNTER — Ambulatory Visit: Admitting: Family Medicine

## 2023-09-08 ENCOUNTER — Other Ambulatory Visit (HOSPITAL_COMMUNITY): Payer: Self-pay

## 2023-09-09 ENCOUNTER — Encounter: Payer: Self-pay | Admitting: Family Medicine

## 2023-10-15 ENCOUNTER — Encounter: Payer: Self-pay | Admitting: Family Medicine

## 2023-10-16 ENCOUNTER — Ambulatory Visit: Admitting: Family Medicine

## 2023-10-16 VITALS — BP 110/77 | HR 62 | Wt 194.0 lb

## 2023-10-16 DIAGNOSIS — R2242 Localized swelling, mass and lump, left lower limb: Secondary | ICD-10-CM

## 2023-10-16 DIAGNOSIS — R609 Edema, unspecified: Secondary | ICD-10-CM

## 2023-10-16 NOTE — Progress Notes (Unsigned)
 Established Patient Office Visit  Subjective    Patient ID: [name], female    DOB: [birth year]  Age: 48 y.o. MRN: [id number]  CC:  Chief Complaint  Patient presents with   Medical Management of Chronic Issues    Swelling in ankle when walking at work, she works 12/14 hr days   Foot Problem    Pt stated to having a knot on the bottom of foot     HPI [name] presents with complaint of a ball under left foot that is painful. Patient also reports some intermittent edema.   Outpatient Encounter Medications as of 10/16/2023  Medication Sig   amLODipine  (NORVASC ) 10 MG tablet Take 1 tablet (10 mg total) by mouth daily.   carvedilol  (COREG ) 25 MG tablet Take 1 tablet (25 mg total) by mouth 2 (two) times daily.   fluticasone  (FLONASE ) 50 MCG/ACT nasal spray Place 2 sprays into both nostrils daily.   losartan -hydrochlorothiazide  (HYZAAR ) 100-25 MG tablet Take 1 tablet by mouth daily.   spironolactone  (ALDACTONE ) 25 MG tablet Take 1 tablet (25 mg total) by mouth daily.   triamcinolone  ointment (KENALOG ) 0.5 % Apply 1 Application topically 2 (two) times daily.   albuterol  (VENTOLIN  HFA) 108 (90 Base) MCG/ACT inhaler Inhale 1-2 puffs into the lungs every 6 (six) hours as needed. (Patient not taking: Reported on 10/16/2023)   cetirizine  (ZYRTEC ) 10 MG tablet Take 1 tablet (10 mg total) by mouth daily. (Patient not taking: Reported on 10/16/2023)   No facility-administered encounter medications on file as of 10/16/2023.    Past Medical History:  Diagnosis Date   Abscess of left axilla 12/03/2019   Presents with 4d of painful abscess under L axilla after shaving. No fever or chills. It has not drained. On exam, there is a small area of draining pus surrounded by 1cm area of fluctuance and erythema, surrounded by a larger indurated area. Not significantly tender. No tracking noted. Small amount of pus expressed.   Essential hypertension 04/27/2007   Qualifier: Diagnosis of  By:  Genia Kettering MD, Erin     Hepatitis C    HFrEF (heart failure with reduced ejection fraction) (HCC)    Hypertension    Left breast mass 07/22/2019    Past Surgical History:  Procedure Laterality Date   LEFT AND RIGHT HEART CATHETERIZATION WITH CORONARY ANGIOGRAM N/A 06/23/2013   Procedure: LEFT AND RIGHT HEART CATHETERIZATION WITH CORONARY ANGIOGRAM;  Surgeon: Darrold Emms, MD;  Location: [facility];  Service: Cardiovascular;  Laterality: N/A;   LIVER BIOPSY     LIVER BIOPSY      Family History  Problem Relation Age of Onset   Hypertension Mother    Hyperlipidemia Mother    Diabetes Mother    Hepatitis C Mother    Diabetes Maternal Aunt     Social History   Socioeconomic History   Marital status: Single    Spouse name: Not on file   Number of children: 6   Years of education: Not on file   Highest education level: 11th grade  Occupational History   Not on file  Tobacco Use   Smoking status: Some Days    Current packs/day: 0.50    Types: Cigarettes   Smokeless tobacco: Never   Tobacco comments:    6-7 per day ./  has Vaped   Vaping Use   Vaping status: Never Used  Substance and Sexual Activity   Alcohol use: Yes    Alcohol/week: 28.0 standard  drinks of alcohol    Types: 28 Standard drinks or equivalent per week   Drug use: Yes    Frequency: 0.5 times per week    Types: Marijuana   Sexual activity: Not on file  Other Topics Concern   Not on file  Social History Narrative   Not on file   Social Drivers of Health   Financial Resource Strain: Medium Risk (10/16/2023)   Overall Financial Resource Strain (CARDIA)    Difficulty of Paying Living Expenses: Somewhat hard  Food Insecurity: Food Insecurity Present (10/16/2023)   Hunger Vital Sign    Worried About Running Out of Food in the Last Year: Sometimes true    Ran Out of Food in the Last Year: Sometimes true  Transportation Needs: No Transportation Needs (10/16/2023)   PRAPARE - Scientist, research (physical sciences) (Medical): No    Lack of Transportation (Non-Medical): No  Physical Activity: Inactive (10/16/2023)   Exercise Vital Sign    Days of Exercise per Week: 5 days    Minutes of Exercise per Session: 0 min  Stress: No Stress Concern Present (10/16/2023)   [employer] of Occupational Health - Occupational Stress Questionnaire    Feeling of Stress: Not at all  Social Connections: Moderately Integrated (10/16/2023)   Social Connection and Isolation Panel    Frequency of Communication with Friends and Family: More than three times a week    Frequency of Social Gatherings with Friends and Family: Twice a week    Attends Religious Services: 1 to 4 times per year    Active Member of [employer] or Organizations: No    Attends Banker Meetings: Not on file    Marital Status: Living with partner  Intimate Partner Violence: Not At Risk (05/13/2023)   Humiliation, Afraid, Rape, and Kick questionnaire    Fear of Current or Ex-Partner: No    Emotionally Abused: No    Physically Abused: No    Sexually Abused: No    Review of Systems  All other systems reviewed and are negative.       Objective    BP 110/77 (Cuff Size: Normal)   Pulse 62   Wt 194 lb (88 kg)   SpO2 94%   BMI 35.48 kg/m   Physical Exam Vitals and nursing note reviewed.  Constitutional:      General: She is not in acute distress.  Cardiovascular:     Rate and Rhythm: Normal rate and regular rhythm.  Pulmonary:     Effort: Pulmonary effort is normal.     Breath sounds: Normal breath sounds.   Musculoskeletal:     Left foot: Deformity (lesion plantar aspect of left foot) and tenderness present.   Neurological:     General: No focal deficit present.     Mental Status: She is alert and oriented to person, place, and time.         Assessment & Plan:   Foot mass, left -     Ambulatory referral to Podiatry  Edema, unspecified type   Improved/resolved  Return if symptoms worsen or  fail to improve.   Arlo Lama, MD

## 2023-10-28 ENCOUNTER — Ambulatory Visit (INDEPENDENT_AMBULATORY_CARE_PROVIDER_SITE_OTHER)

## 2023-10-28 ENCOUNTER — Ambulatory Visit (INDEPENDENT_AMBULATORY_CARE_PROVIDER_SITE_OTHER): Admitting: Podiatry

## 2023-10-28 DIAGNOSIS — M7732 Calcaneal spur, left foot: Secondary | ICD-10-CM

## 2023-10-28 DIAGNOSIS — M79672 Pain in left foot: Secondary | ICD-10-CM

## 2023-10-28 DIAGNOSIS — M722 Plantar fascial fibromatosis: Secondary | ICD-10-CM | POA: Diagnosis not present

## 2023-10-28 MED ORDER — TRIAMCINOLONE ACETONIDE 10 MG/ML IJ SUSP
10.0000 mg | Freq: Once | INTRAMUSCULAR | Status: AC
  Administered 2023-10-28: 10 mg

## 2023-10-28 NOTE — Progress Notes (Signed)
 Patient presents with complaint of pain along the arch of the foot on the left.  She has noticed a small nodule forming.  Hurts after work if she has been on her feet all day.  She does housecleaning.  Has not noticed any redness or ecchymosis or color changes in the area.   Physical exam:  General appearance: Pleasant, and in no acute distress. AOx3.  Vascular: Pedal pulses: DP 2/4 bilaterally, PT 2/4 bilaterally. ModerATE edema lower legs bilaterally. Capillary fill time IMMEDIATE  b/l.  Neurological: Light touch intact feet bilaterally.  Normal Achilles reflex bilaterally.  No clonus or spasticity noted.  Negative Tinel's sign tarsal tunnel and porta pedis left  Dermatologic:   Skin normal temperature bilaterally.  Skin normal color, tone, and texture bilaterally.   Musculoskeletal: Tenderness with medial band of plantar fascia at mid band of the plantar fascia.  Adherent to overlying skin.  Slight tenderness plantar medial aspect heel.  It is irritating Haid today more so than last  Radiographs: 3 views foot left: Osteophytic changes posterior and plantar aspect calcaneus.  No soft tissue densities noted.  No assenting fractures or dislocations or foreign bodies.  Diagnosis: 1.  Pain in the left foot. 2.  Calcaneal spur left. 3.  Plantar fasciitis/plantar fibroma left foot  Plan: -New patient visit office level 3 for evaluation and management. Modifier 25 -Discussed plantar fascia plantar fibroma.  Discussed etiology and treatment.  Typically these can be dealt with conservatively without the need for surgical excision.  Discussed brain good supportive well cushioned shoes. -injected 3cc 2:1 mixture 0.5 cc Marcaine:Kenolog 10mg /6ml at mid band plantar fascial at plantar fibroma left.  -Written oral and written instructions given plantar fascia exercises  Return

## 2023-10-29 ENCOUNTER — Encounter: Payer: Self-pay | Admitting: Podiatry

## 2023-11-11 ENCOUNTER — Ambulatory Visit: Admitting: Podiatry

## 2023-11-24 ENCOUNTER — Ambulatory Visit: Payer: Self-pay

## 2023-11-24 NOTE — Telephone Encounter (Signed)
 Sister called on behalf of patient, was not with patient to obtain verbal permission to discuss. States sister is not currently experiencing chest pain. Advised to ask patient to contact us  directly. Advised to go to the ED if she begins to have chest pain.  Copied from CRM [id number]. Topic: Clinical - Red Word Triage >> Nov 24, 2023 10:28 AM Turkey B wrote: [facility] that prompted transfer to Nurse Triage: pt has chest pain when she is at work

## 2023-12-05 ENCOUNTER — Other Ambulatory Visit: Payer: Self-pay | Admitting: Student

## 2023-12-05 DIAGNOSIS — I10 Essential (primary) hypertension: Secondary | ICD-10-CM

## 2023-12-05 NOTE — Telephone Encounter (Signed)
 NO LONGER [facility] PATIENT

## 2023-12-11 ENCOUNTER — Other Ambulatory Visit: Payer: Self-pay | Admitting: Family Medicine

## 2023-12-11 DIAGNOSIS — I10 Essential (primary) hypertension: Secondary | ICD-10-CM

## 2023-12-11 NOTE — Telephone Encounter (Unsigned)
 Copied from CRM [id number]. Topic: Clinical - Medication Refill >> Dec 11, 2023 12:14 PM Zebedee SAUNDERS wrote: Medication: carvedilol  (COREG ) 25 MG tablet, amLODipine  (NORVASC ) 10 MG tablet,  spironolactone  (ALDACTONE ) 25 MG tablet, losartan -hydrochlorothiazide  (HYZAAR ) 100-25 MG tablet   Has the patient contacted their pharmacy? Yes (Agent: If no, request that the patient contact the pharmacy for the refill. If patient does not wish to contact the pharmacy document the reason why and proceed with request.) (Agent: If yes, when and what did the pharmacy advise?)  This is the patient's preferred pharmacy:  [facility] [address], [place] - [address] [place] [address] Phone: [phone] Fax: [phone]  Is this the correct pharmacy for this prescription? Yes If no, delete pharmacy and type the correct one.   Has the prescription been filled recently? Yes  Is the patient out of the medication? Yes  Has the patient been seen for an appointment in the last year OR does the patient have an upcoming appointment? Yes  Can we respond through MyChart? Yes  Agent: Please be advised that Rx refills may take up to 3 business days. We ask that you follow-up with your pharmacy.

## 2023-12-11 NOTE — Telephone Encounter (Unsigned)
 Copied from CRM [id number]. Topic: Clinical - Medication Refill >> Dec 11, 2023 12:19 PM Zebedee SAUNDERS wrote: Medication: triamcinolone  ointment (KENALOG ) 0.5 %  Has the patient contacted their pharmacy? Yes (Agent: If no, request that the patient contact the pharmacy for the refill. If patient does not wish to contact the pharmacy document the reason why and proceed with request.) (Agent: If yes, when and what did the pharmacy advise?)  This is the patient's preferred pharmacy:  [facility] [address], [place] - [address] [place] [address] Phone: [phone] Fax: [phone]  Is this the correct pharmacy for this prescription? Yes If no, delete pharmacy and type the correct one.   Has the prescription been filled recently? Yes  Is the patient out of the medication? Yes  Has the patient been seen for an appointment in the last year OR does the patient have an upcoming appointment? Yes  Can we respond through MyChart? Yes  Agent: Please be advised that Rx refills may take up to 3 business days. We ask that you follow-up with your pharmacy.

## 2023-12-15 MED ORDER — TRIAMCINOLONE ACETONIDE 0.5 % EX OINT: 1.0000 | TOPICAL_OINTMENT | Freq: Two times a day (BID) | CUTANEOUS | 0 refills | Status: DC

## 2023-12-15 NOTE — Telephone Encounter (Signed)
 Requested medication (s) are due for refill today - yes  Requested medication (s) are on the active medication list -yes  Future visit scheduled -yes  Last refill: 07/25/23 30g  Notes to clinic: non delegated Rx  Requested Prescriptions  Pending Prescriptions Disp Refills   triamcinolone  ointment (KENALOG ) 0.5 % 30 g 0    Sig: Apply 1 Application topically 2 (two) times daily.     Not Delegated - Dermatology:  Corticosteroids Failed - 12/15/2023  1:55 PM      Failed - This refill cannot be delegated      Passed - Valid encounter within last 12 months    Recent Outpatient Visits           2 months ago Foot mass, left   [place] [facility], MD   5 months ago Annual physical exam   [place] [facility], MD   7 months ago Dizziness and giddiness   [place] [facility], MD   10 months ago Essential hypertension   [place] [facility], MD   9 years ago Essential hypertension   [place] [facility] [place]. [facility] Oleta Berwyn LABOR, NP                 Requested Prescriptions  Pending Prescriptions Disp Refills   triamcinolone  ointment (KENALOG ) 0.5 % 30 g 0    Sig: Apply 1 Application topically 2 (two) times daily.     Not Delegated - Dermatology:  Corticosteroids Failed - 12/15/2023  1:55 PM      Failed - This refill cannot be delegated      Passed - Valid encounter within last 12 months    Recent Outpatient Visits           2 months ago Foot mass, left   [place] [facility], MD   5 months ago Annual physical exam   [place] [facility], MD   7 months ago Dizziness and giddiness   [place] [facility], MD   10 months ago Essential hypertension   [place] [facility], MD   9 years ago Essential hypertension    [facility] [place]. [facility] Oleta Berwyn LABOR, NP

## 2023-12-15 NOTE — Telephone Encounter (Signed)
 Requested medications are due for refill today.  A little too soon  Requested medications are on the active medications list.  yes  Last refill. 01/13/2023 - 1 year sully  Future visit scheduled.   yes  Notes to clinic.  All rxs signed by another provider.    Requested Prescriptions  Pending Prescriptions Disp Refills   carvedilol  (COREG ) 25 MG tablet 60 tablet 11    Sig: Take 1 tablet (25 mg total) by mouth 2 (two) times daily.     Cardiovascular: Beta Blockers 3 Passed - 12/15/2023  1:55 PM      Passed - Cr in normal range and within 360 days    Creat  Date Value Ref Range Status  11/16/2019 0.65 0.50 - 1.10 mg/dL Final   Creatinine, Ser  Date Value Ref Range Status  07/08/2023 0.69 0.57 - 1.00 mg/dL Final         Passed - AST in normal range and within 360 days    AST  Date Value Ref Range Status  07/08/2023 15 0 - 40 IU/L Final         Passed - ALT in normal range and within 360 days    ALT  Date Value Ref Range Status  07/08/2023 11 0 - 32 IU/L Final  08/20/2019 88 (H) 6 - 29 U/L Final         Passed - Last BP in normal range    BP Readings from Last 1 Encounters:  10/16/23 110/77         Passed - Last Heart Rate in normal range    Pulse Readings from Last 1 Encounters:  10/16/23 62         Passed - Valid encounter within last 6 months    Recent Outpatient Visits           2 months ago Foot mass, left   [place] [facility], MD   5 months ago Annual physical exam   [place] [facility], MD   7 months ago Dizziness and giddiness   [place] [facility], MD   10 months ago Essential hypertension   [place] [facility], MD   9 years ago Essential hypertension   [place] [facility] [place]. [facility] Oleta Kins A, NP               spironolactone  (ALDACTONE ) 25  MG tablet 30 tablet 11    Sig: Take 1 tablet (25 mg total) by mouth daily.     Cardiovascular: Diuretics - Aldosterone Antagonist Passed - 12/15/2023  1:55 PM      Passed - Cr in normal range and within 180 days    Creat  Date Value Ref Range Status  11/16/2019 0.65 0.50 - 1.10 mg/dL Final   Creatinine, Ser  Date Value Ref Range Status  07/08/2023 0.69 0.57 - 1.00 mg/dL Final         Passed - K in normal range and within 180 days    Potassium  Date Value Ref Range Status  07/08/2023 4.4 3.5 - 5.2 mmol/L Final         Passed - Na in normal range and within 180 days    Sodium  Date Value Ref Range Status  07/08/2023 135 134 - 144 mmol/L Final  Passed - eGFR is 30 or above and within 180 days    GFR calc Af Amer  Date Value Ref Range Status  12/27/2019 126 >59 mL/min/1.73 Final    Comment:    **[facility] currently reports eGFR in compliance with the current**   recommendations of the [employer]. [facility] will   update reporting as new guidelines are published from the NKF-ASN   Task force.    GFR calc non Af Amer  Date Value Ref Range Status  12/27/2019 109 >59 mL/min/1.73 Final   eGFR  Date Value Ref Range Status  07/08/2023 107 >59 mL/min/1.73 Final         Passed - Last BP in normal range    BP Readings from Last 1 Encounters:  10/16/23 110/77         Passed - Valid encounter within last 6 months    Recent Outpatient Visits           2 months ago Foot mass, left   [place] [facility], MD   5 months ago Annual physical exam   [place] [facility], MD   7 months ago Dizziness and giddiness   [place] [facility], MD   10 months ago Essential hypertension   [place] [facility], MD   9 years ago Essential hypertension   [place] [facility] [place]. [facility] Oleta Kins A, NP               amLODipine  (NORVASC ) 10 MG tablet 90 tablet 3    Sig: Take 1 tablet (10 mg total) by mouth daily.     Cardiovascular: Calcium  Channel Blockers 2 Passed - 12/15/2023  1:55 PM      Passed - Last BP in normal range    BP Readings from Last 1 Encounters:  10/16/23 110/77         Passed - Last Heart Rate in normal range    Pulse Readings from Last 1 Encounters:  10/16/23 62         Passed - Valid encounter within last 6 months    Recent Outpatient Visits           2 months ago Foot mass, left   [place] [facility], MD   5 months ago Annual physical exam   [place] [facility], MD   7 months ago Dizziness and giddiness   [place] [facility], MD   10 months ago Essential hypertension   [place] [facility], MD   9 years ago Essential hypertension    [facility] [place]. [facility] Oleta Kins A, NP               losartan -hydrochlorothiazide  (HYZAAR ) 100-25 MG tablet 30 tablet 11    Sig: Take 1 tablet by mouth daily.     Cardiovascular: ARB + Diuretic Combos Passed - 12/15/2023  1:55 PM      Passed - K in normal range and within 180 days    Potassium  Date Value Ref Range Status  07/08/2023 4.4 3.5 - 5.2 mmol/L Final         Passed - Na  in normal range and within 180 days    Sodium  Date Value Ref Range Status  07/08/2023 135 134 - 144 mmol/L Final         Passed - Cr in normal range and within 180 days    Creat  Date Value Ref Range Status  11/16/2019 0.65 0.50 - 1.10 mg/dL Final   Creatinine, Ser  Date Value Ref Range Status  07/08/2023 0.69 0.57 - 1.00 mg/dL Final         Passed - eGFR is 10 or above and within 180 days    GFR calc Af Amer  Date Value Ref Range Status  12/27/2019 126 >59 mL/min/1.73 Final     Comment:    **[facility] currently reports eGFR in compliance with the current**   recommendations of the [employer]. [facility] will   update reporting as new guidelines are published from the NKF-ASN   Task force.    GFR calc non Af Amer  Date Value Ref Range Status  12/27/2019 109 >59 mL/min/1.73 Final   eGFR  Date Value Ref Range Status  07/08/2023 107 >59 mL/min/1.73 Final         Passed - Patient is not pregnant      Passed - Last BP in normal range    BP Readings from Last 1 Encounters:  10/16/23 110/77         Passed - Valid encounter within last 6 months    Recent Outpatient Visits           2 months ago Foot mass, left   [place] [facility], MD   5 months ago Annual physical exam   [place] [facility], MD   7 months ago Dizziness and giddiness   [place] [facility], MD   10 months ago Essential hypertension   [place] [facility], MD   9 years ago Essential hypertension   [place] [facility] [place]. [facility] Oleta Berwyn LABOR, NP

## 2023-12-28 ENCOUNTER — Telehealth: Admitting: Family

## 2023-12-28 DIAGNOSIS — B9689 Other specified bacterial agents as the cause of diseases classified elsewhere: Secondary | ICD-10-CM

## 2023-12-28 DIAGNOSIS — N760 Acute vaginitis: Secondary | ICD-10-CM | POA: Diagnosis not present

## 2023-12-28 MED ORDER — METRONIDAZOLE 500 MG PO TABS: 500.0000 mg | ORAL_TABLET | Freq: Two times a day (BID) | ORAL | 0 refills | Status: AC

## 2023-12-28 NOTE — Progress Notes (Signed)

## 2024-01-01 ENCOUNTER — Telehealth: Payer: Self-pay | Admitting: Family Medicine

## 2024-01-01 NOTE — Telephone Encounter (Unsigned)
 Copied from CRM [id number]. Topic: Clinical - Medication Refill >> Jan 01, 2024  4:37 PM Kevelyn M wrote: Medication: carvedilol  (COREG ) 25 MG tablet, spironolactone  (ALDACTONE ) 25 MG tablet,  amLODipine  (NORVASC ) 10 MG tablet,losartan -hydrochlorothiazide  (HYZAAR ) 100-25 MG tablet Has the patient contacted their pharmacy? Yes (Agent: If no, request that the patient contact the pharmacy for the refill. If patient does not wish to contact the pharmacy document the reason why and proceed with request.) (Agent: If yes, when and what did the pharmacy advise?)  This is the patient's preferred pharmacy:  [facility] [address] [place] - [address] [place] [address] Phone: [phone] Fax: [phone]  Is this the correct pharmacy for this prescription? Yes If no, delete pharmacy and type the correct one.   Has the prescription been filled recently? No  Is the patient out of the medication? No  Has the patient been seen for an appointment in the last year OR does the patient have an upcoming appointment? Yes  Can we respond through MyChart? Yes  Agent: Please be advised that Rx refills may take up to 3 business days. We ask that you follow-up with your pharmacy.

## 2024-01-02 NOTE — Telephone Encounter (Signed)
 Requested Medications  was written by another provider please advised.

## 2024-01-05 ENCOUNTER — Other Ambulatory Visit: Payer: Self-pay | Admitting: Family Medicine

## 2024-01-05 DIAGNOSIS — I10 Essential (primary) hypertension: Secondary | ICD-10-CM

## 2024-01-05 MED ORDER — LOSARTAN POTASSIUM-HCTZ 100-25 MG PO TABS
1.0000 | ORAL_TABLET | Freq: Every day | ORAL | 0 refills | Status: DC
Start: 2024-01-05 — End: 2024-01-19

## 2024-01-05 MED ORDER — CARVEDILOL 25 MG PO TABS: 25.0000 mg | ORAL_TABLET | Freq: Two times a day (BID) | ORAL | 0 refills | Status: DC

## 2024-01-05 MED ORDER — SPIRONOLACTONE 25 MG PO TABS: 25.0000 mg | ORAL_TABLET | Freq: Every day | ORAL | 0 refills | Status: DC

## 2024-01-05 MED ORDER — AMLODIPINE BESYLATE 10 MG PO TABS: 10.0000 mg | ORAL_TABLET | Freq: Every day | ORAL | 0 refills | Status: DC

## 2024-01-19 ENCOUNTER — Ambulatory Visit: Attending: Family Medicine

## 2024-01-19 ENCOUNTER — Encounter: Payer: Self-pay | Admitting: Family Medicine

## 2024-01-19 ENCOUNTER — Ambulatory Visit: Admitting: Family Medicine

## 2024-01-19 VITALS — BP 143/87 | HR 58 | Ht 62.0 in | Wt 188.8 lb

## 2024-01-19 DIAGNOSIS — F17200 Nicotine dependence, unspecified, uncomplicated: Secondary | ICD-10-CM | POA: Diagnosis not present

## 2024-01-19 DIAGNOSIS — I10 Essential (primary) hypertension: Secondary | ICD-10-CM | POA: Diagnosis not present

## 2024-01-19 DIAGNOSIS — N951 Menopausal and female climacteric states: Secondary | ICD-10-CM

## 2024-01-19 DIAGNOSIS — R42 Dizziness and giddiness: Secondary | ICD-10-CM | POA: Diagnosis not present

## 2024-01-19 MED ORDER — LOSARTAN POTASSIUM-HCTZ 100-25 MG PO TABS: 1.0000 | ORAL_TABLET | Freq: Every day | ORAL | 2 refills | Status: AC

## 2024-01-19 MED ORDER — SPIRONOLACTONE 25 MG PO TABS: 25.0000 mg | ORAL_TABLET | Freq: Every day | ORAL | 2 refills | Status: AC

## 2024-01-19 MED ORDER — AMLODIPINE BESYLATE 10 MG PO TABS: 10.0000 mg | ORAL_TABLET | Freq: Every day | ORAL | 2 refills | Status: AC

## 2024-01-19 MED ORDER — CARVEDILOL 25 MG PO TABS: 25.0000 mg | ORAL_TABLET | Freq: Two times a day (BID) | ORAL | 2 refills | Status: AC

## 2024-01-19 NOTE — Progress Notes (Unsigned)
 EP to read.

## 2024-01-19 NOTE — Progress Notes (Unsigned)
 Established Patient Office Visit  Subjective    Patient ID: Patricia Sahara L Thall, female    DOB: 02/22/1976  Age: 48 y.o. MRN: [id number]  CC:  Chief Complaint  Patient presents with   Medical Management of Chronic Issues    Pt reports she is still getting dizzy. It is happening quite often and lasting a little bit longer now.  Pt also reports has not had her period in 3 months     HPI [name] presents with complaint of recurrent intermittent dizziness. She reports that it is happening more often. Patient also reports no menses for 3 months at home pregnancy tests have been negative.   Outpatient Encounter Medications as of 01/19/2024  Medication Sig   triamcinolone  ointment (KENALOG ) 0.5 % Apply 1 Application topically 2 (two) times daily.   [DISCONTINUED] amLODipine  (NORVASC ) 10 MG tablet Take 1 tablet (10 mg total) by mouth daily.   [DISCONTINUED] carvedilol  (COREG ) 25 MG tablet Take 1 tablet (25 mg total) by mouth 2 (two) times daily.   [DISCONTINUED] losartan -hydrochlorothiazide  (HYZAAR ) 100-25 MG tablet Take 1 tablet by mouth daily.   [DISCONTINUED] spironolactone  (ALDACTONE ) 25 MG tablet Take 1 tablet (25 mg total) by mouth daily.   albuterol  (VENTOLIN  HFA) 108 (90 Base) MCG/ACT inhaler Inhale 1-2 puffs into the lungs every 6 (six) hours as needed. (Patient not taking: Reported on 10/16/2023)   amLODipine  (NORVASC ) 10 MG tablet Take 1 tablet (10 mg total) by mouth daily.   carvedilol  (COREG ) 25 MG tablet Take 1 tablet (25 mg total) by mouth 2 (two) times daily.   cetirizine  (ZYRTEC ) 10 MG tablet Take 1 tablet (10 mg total) by mouth daily. (Patient not taking: Reported on 01/19/2024)   fluticasone  (FLONASE ) 50 MCG/ACT nasal spray Place 2 sprays into both nostrils daily. (Patient not taking: Reported on 01/19/2024)   losartan -hydrochlorothiazide  (HYZAAR ) 100-25 MG tablet Take 1 tablet by mouth daily.   spironolactone  (ALDACTONE ) 25 MG tablet Take 1 tablet (25 mg total) by mouth  daily.   No facility-administered encounter medications on file as of 01/19/2024.    Past Medical History:  Diagnosis Date   Abscess of left axilla 12/03/2019   Presents with 4d of painful abscess under L axilla after shaving. No fever or chills. It has not drained. On exam, there is a small area of draining pus surrounded by 1cm area of fluctuance and erythema, surrounded by a larger indurated area. Not significantly tender. No tracking noted. Small amount of pus expressed.   Essential hypertension 04/27/2007   Qualifier: Diagnosis of  By: Jonelle MD, Erin     Hepatitis C    HFrEF (heart failure with reduced ejection fraction) (HCC)    Hypertension    Left breast mass 07/22/2019    Past Surgical History:  Procedure Laterality Date   LEFT AND RIGHT HEART CATHETERIZATION WITH CORONARY ANGIOGRAM N/A 06/23/2013   Procedure: LEFT AND RIGHT HEART CATHETERIZATION WITH CORONARY ANGIOGRAM;  Surgeon: Salena GORMAN Negri, MD;  Location: [facility] CATH [facility];  Service: Cardiovascular;  Laterality: N/A;   LIVER BIOPSY     LIVER BIOPSY      Family History  Problem Relation Age of Onset   Hypertension Mother    Hyperlipidemia Mother    Diabetes Mother    Hepatitis C Mother    Diabetes Maternal Aunt     Social History   Socioeconomic History   Marital status: Single    Spouse name: Not on file   Number of children: 6  Years of education: Not on file   Highest education level: 11th grade  Occupational History   Not on file  Tobacco Use   Smoking status: Some Days    Current packs/day: 0.50    Types: Cigarettes   Smokeless tobacco: Never   Tobacco comments:    6-7 per day ./  has Vaped   Vaping Use   Vaping status: Never Used  Substance and Sexual Activity   Alcohol use: Yes    Alcohol/week: 28.0 standard drinks of alcohol    Types: 28 Standard drinks or equivalent per week   Drug use: Yes    Frequency: 0.5 times per week    Types: Marijuana   Sexual activity: Not on file  Other Topics  Concern   Not on file  Social History Narrative   Not on file   Social Drivers of Health   Financial Resource Strain: Medium Risk (10/16/2023)   Overall Financial Resource Strain (CARDIA)    Difficulty of Paying Living Expenses: Somewhat hard  Food Insecurity: Food Insecurity Present (10/16/2023)   Hunger Vital Sign    Worried About Running Out of Food in the Last Year: Sometimes true    Ran Out of Food in the Last Year: Sometimes true  Transportation Needs: No Transportation Needs (10/16/2023)   PRAPARE - Administrator, Civil Service (Medical): No    Lack of Transportation (Non-Medical): No  Physical Activity: Inactive (10/16/2023)   Exercise Vital Sign    Days of Exercise per Week: 5 days    Minutes of Exercise per Session: 0 min  Stress: No Stress Concern Present (10/16/2023)   [employer] of Occupational Health - Occupational Stress Questionnaire    Feeling of Stress: Not at all  Social Connections: Moderately Integrated (10/16/2023)   Social Connection and Isolation Panel    Frequency of Communication with Friends and Family: More than three times a week    Frequency of Social Gatherings with Friends and Family: Twice a week    Attends Religious Services: 1 to 4 times per year    Active Member of [employer] or Organizations: No    Attends Banker Meetings: Not on file    Marital Status: Living with partner  Intimate Partner Violence: Not At Risk (05/13/2023)   Humiliation, Afraid, Rape, and Kick questionnaire    Fear of Current or Ex-Partner: No    Emotionally Abused: No    Physically Abused: No    Sexually Abused: No    Review of Systems  Neurological:  Positive for dizziness.  All other systems reviewed and are negative.       Objective    BP (!) 143/87   Pulse (!) 58   Ht 5' 2 (1.575 m)   Wt 188 lb 12.8 oz (85.6 kg)   LMP 10/19/2023 (Approximate)   SpO2 96%   BMI 34.53 kg/m   Physical Exam Vitals and nursing note reviewed.   Constitutional:      General: She is not in acute distress. HENT:     Head: Normocephalic and atraumatic.  Cardiovascular:     Rate and Rhythm: Normal rate and regular rhythm.  Pulmonary:     Effort: Pulmonary effort is normal.     Breath sounds: Normal breath sounds.  Abdominal:     Palpations: Abdomen is soft.     Tenderness: There is no abdominal tenderness.  Neurological:     General: No focal deficit present.     Mental Status: She  is alert and oriented to person, place, and time.         Assessment & Plan:  1. Dizziness and giddiness (Primary) Will do cardiac monitoring and consider referral to cardiology vs neurology pending results.  - LONG TERM MONITOR XT (3-14 DAYS); Future  2. Essential hypertension Continue  - amLODipine  (NORVASC ) 10 MG tablet; Take 1 tablet (10 mg total) by mouth daily.  Dispense: 90 tablet; Refill: 2 - carvedilol  (COREG ) 25 MG tablet; Take 1 tablet (25 mg total) by mouth 2 (two) times daily.  Dispense: 180 tablet; Refill: 2 - losartan -hydrochlorothiazide  (HYZAAR ) 100-25 MG tablet; Take 1 tablet by mouth daily.  Dispense: 90 tablet; Refill: 2 - spironolactone  (ALDACTONE ) 25 MG tablet; Take 1 tablet (25 mg total) by mouth daily.  Dispense: 90 tablet; Refill: 2  3. Perimenopausal ? Most like cause of oligomenorrhea. Discussed birth control during this period of time  4. Smoker Discussed reduction/cessation     Return in about 3 months (around 04/19/2024) for follow up.   Tanda Raguel SQUIBB, MD

## 2024-01-20 ENCOUNTER — Encounter: Payer: Self-pay | Admitting: Family Medicine

## 2024-02-28 DIAGNOSIS — R42 Dizziness and giddiness: Secondary | ICD-10-CM | POA: Diagnosis not present

## 2024-03-01 ENCOUNTER — Ambulatory Visit: Payer: Self-pay | Admitting: Family Medicine

## 2024-03-19 ENCOUNTER — Emergency Department (HOSPITAL_COMMUNITY)

## 2024-03-19 ENCOUNTER — Other Ambulatory Visit: Payer: Self-pay

## 2024-03-19 ENCOUNTER — Emergency Department (HOSPITAL_COMMUNITY)
Admission: EM | Admit: 2024-03-19 | Discharge: 2024-03-19 | Disposition: A | Discharge status: Home / Self Care | Attending: Emergency Medicine | Admitting: Emergency Medicine

## 2024-03-19 ENCOUNTER — Encounter (HOSPITAL_COMMUNITY): Payer: Self-pay

## 2024-03-19 DIAGNOSIS — I10 Essential (primary) hypertension: Secondary | ICD-10-CM | POA: Diagnosis not present

## 2024-03-19 DIAGNOSIS — Z79899 Other long term (current) drug therapy: Secondary | ICD-10-CM | POA: Diagnosis not present

## 2024-03-19 DIAGNOSIS — R42 Dizziness and giddiness: Secondary | ICD-10-CM | POA: Diagnosis present

## 2024-03-19 DIAGNOSIS — M79603 Pain in arm, unspecified: Secondary | ICD-10-CM | POA: Diagnosis not present

## 2024-03-19 DIAGNOSIS — F17210 Nicotine dependence, cigarettes, uncomplicated: Secondary | ICD-10-CM | POA: Insufficient documentation

## 2024-03-19 DIAGNOSIS — R001 Bradycardia, unspecified: Secondary | ICD-10-CM | POA: Insufficient documentation

## 2024-03-19 DIAGNOSIS — R0789 Other chest pain: Secondary | ICD-10-CM

## 2024-03-19 LAB — CBC
HCT: 35.7 % — ABNORMAL LOW (ref 36.0–46.0)
Hemoglobin: 12.1 g/dL (ref 12.0–15.0)
MCH: 28.6 pg (ref 26.0–34.0)
MCHC: 33.9 g/dL (ref 30.0–36.0)
MCV: 84.4 fL (ref 80.0–100.0)
Platelets: 264 K/uL (ref 150–400)
RBC: 4.23 MIL/uL (ref 3.87–5.11)
RDW: 13.2 % (ref 11.5–15.5)
WBC: 8.2 K/uL (ref 4.0–10.5)
nRBC: 0 % (ref 0.0–0.2)

## 2024-03-19 LAB — BASIC METABOLIC PANEL WITH GFR
Anion gap: 8 (ref 5–15)
BUN: 10 mg/dL (ref 6–20)
CO2: 24 mmol/L (ref 22–32)
Calcium: 8.7 mg/dL — ABNORMAL LOW (ref 8.9–10.3)
Chloride: 106 mmol/L (ref 98–111)
Creatinine, Ser: 0.74 mg/dL (ref 0.44–1.00)
GFR, Estimated: >60 mL/min (ref >60)
Glucose, Bld: 128 mg/dL — ABNORMAL HIGH (ref 70–99)
Potassium: 3.6 mmol/L (ref 3.5–5.1)
Sodium: 138 mmol/L (ref 135–145)

## 2024-03-19 LAB — TROPONIN I (HIGH SENSITIVITY)
Troponin I (High Sensitivity): 4 ng/L (ref <18)
Troponin I (High Sensitivity): 4 ng/L (ref <18)

## 2024-03-19 LAB — CBG MONITORING, ED: Glucose-Capillary: 134 mg/dL — ABNORMAL HIGH (ref 70–99)

## 2024-03-19 NOTE — ED Notes (Signed)
 CCMD called and notified

## 2024-03-19 NOTE — Telephone Encounter (Signed)
 Noted thank you

## 2024-03-19 NOTE — Discharge Instructions (Signed)
 Follow back up with your primary care doctor regarding the episodic spells that have been occurring.  I understand that she is aware and is evaluating that.  Also have him check her blood pressure again.  A little high here today.  Return for any new or worse symptoms.  Since this could be atypical chest pain do recommend that you see cardiology information provided above call and make an appointment.  Definitely return for anything new or worse.

## 2024-03-19 NOTE — ED Provider Triage Note (Signed)
 Emergency Medicine Provider Triage Evaluation Note  [name] , a 48 y.o. female  was evaluated in triage.  Pt complains of intermittent dizziness x months and L arm pain since this morning. Presyncope (tunnel vision) episode this morning  Review of Systems  Positive: Dizziness (episodes approx. 1 min), L arm pain Negative: N/V, shob, fever, chill, neck stiffness, increased leg swelling, CP  Physical Exam  BP (!) 142/90 (BP Location: Right Arm)   Pulse 66   Temp 98.3 F (36.8 C)   Resp 16   Ht 5' 2 (1.575 m)   Wt 85.3 kg   LMP 03/12/2024 (Exact Date)   SpO2 100%   BMI 34.39 kg/m  Gen:   Awake, no distress   Resp:  Normal effort  MSK:   Moves extremities without difficulty  Other:  No facial droop, or nystagmus on EOM  Medical Decision Making  Medically screening exam initiated at 11:59 AM.  Appropriate orders placed.  Amanda L Emile was informed that the remainder of the evaluation will be completed by another provider, this initial triage assessment does not replace that evaluation, and the importance of remaining in the ED until their evaluation is complete.  Labs and imaging ordered   Francis Ileana SAILOR, PA-C 03/19/24 1204

## 2024-03-19 NOTE — Telephone Encounter (Signed)
 Pt went to the ED. Will schedule a HFU after discharge

## 2024-03-19 NOTE — ED Triage Notes (Signed)
 Pt has had dizziness that has been occurring intermittently for awhile, pt seen for same w/PCP. Pt had episode this morning and felt like she was going to pass out. Pt has left arm pain that is intermittent that started this am. Pt denies nausea, vomiting, or shortness of breath. Pt denies dizziness at this time, does feel lightheaded.

## 2024-03-19 NOTE — ED Provider Notes (Signed)
  EMERGENCY DEPARTMENT AT [facility] Provider Note   CSN: [id number] Arrival date & time: 03/19/24  1120     Patient presents with: Dizziness   [name] is a 48 y.o. female.   Patient predominantly here today for the development of left arm pain intermittent started this morning.  No nausea vomiting or shortness of breath no vertigo or dizziness.  The patient's had the spells where her vision kind and goes double for a brief period of time and she can move around and it goes away always occurs when she is sitting.  Her primary care doctor is aware of this and has been ongoing for about a year sometimes once a month sometimes once every other month.  The patient was mostly concerned about the arm pain.  Patient's blood pressure a little elevated here.  Past medical history significant for hypertension hepatitis C heart failure with reduced ejection fraction left breast mass in 2021 hypertension.  Patient still smokes a half a pack a day some days.  No increase pain with moving the left arm.       Prior to Admission medications   Medication Sig Start Date End Date Taking? Authorizing Provider  albuterol  (VENTOLIN  HFA) 108 (90 Base) MCG/ACT inhaler Inhale 1-2 puffs into the lungs every 6 (six) hours as needed. Patient not taking: Reported on 10/16/2023 08/01/23   Burnette, Jennifer M, PA-C  amLODipine  (NORVASC ) 10 MG tablet Take 1 tablet (10 mg total) by mouth daily. 01/19/24   Tanda Bleacher, MD  carvedilol  (COREG ) 25 MG tablet Take 1 tablet (25 mg total) by mouth 2 (two) times daily. 01/19/24 01/18/25  Tanda Bleacher, MD  cetirizine  (ZYRTEC ) 10 MG tablet Take 1 tablet (10 mg total) by mouth daily. Patient not taking: Reported on 01/19/2024 08/01/23   Burnette, Jennifer M, PA-C  fluticasone  (FLONASE ) 50 MCG/ACT nasal spray Place 2 sprays into both nostrils daily. Patient not taking: Reported on 01/19/2024 08/01/23   Burnette, Jennifer M, PA-C  losartan -hydrochlorothiazide   (HYZAAR ) 100-25 MG tablet Take 1 tablet by mouth daily. 01/19/24 01/18/25  Tanda Bleacher, MD  spironolactone  (ALDACTONE ) 25 MG tablet Take 1 tablet (25 mg total) by mouth daily. 01/19/24 01/18/25  Tanda Bleacher, MD  triamcinolone  ointment (KENALOG ) 0.5 % Apply 1 Application topically 2 (two) times daily. 12/15/23   Tanda Bleacher, MD    Allergies: Lisinopril     Review of Systems  Constitutional:  Negative for chills and fever.  HENT:  Negative for ear pain and sore throat.   Eyes:  Negative for pain and visual disturbance.  Respiratory:  Negative for cough and shortness of breath.   Cardiovascular:  Positive for chest pain. Negative for palpitations.  Gastrointestinal:  Negative for abdominal pain and vomiting.  Genitourinary:  Negative for dysuria and hematuria.  Musculoskeletal:  Negative for arthralgias and back pain.  Skin:  Negative for color change and rash.  Neurological:  Negative for dizziness, seizures, syncope, speech difficulty, weakness, numbness and headaches.  All other systems reviewed and are negative.   Updated Vital Signs BP (!) 151/86   Pulse (!) 57   Temp 98.2 F (36.8 C) (Oral)   Resp 18   Ht 1.575 m (5' 2)   Wt 85.3 kg   LMP 03/12/2024 (Exact Date)   SpO2 100%   BMI 34.39 kg/m   Physical Exam Vitals and nursing note reviewed.  Constitutional:      General: She is not in acute distress.    Appearance: Normal  appearance. She is well-developed.  HENT:     Head: Normocephalic and atraumatic.  Eyes:     Extraocular Movements: Extraocular movements intact.     Conjunctiva/sclera: Conjunctivae normal.     Pupils: Pupils are equal, round, and reactive to light.  Cardiovascular:     Rate and Rhythm: Normal rate and regular rhythm.     Heart sounds: No murmur heard. Pulmonary:     Effort: Pulmonary effort is normal. No respiratory distress.     Breath sounds: Normal breath sounds. No wheezing, rhonchi or rales.  Abdominal:     Palpations: Abdomen is  soft.     Tenderness: There is no abdominal tenderness.  Musculoskeletal:        General: No swelling.     Cervical back: Neck supple.  Skin:    General: Skin is warm and dry.     Capillary Refill: Capillary refill takes less than 2 seconds.  Neurological:     General: No focal deficit present.     Mental Status: She is alert and oriented to person, place, and time.     Cranial Nerves: No cranial nerve deficit.     Sensory: No sensory deficit.     Motor: No weakness.  Psychiatric:        Mood and Affect: Mood normal.     (all labs ordered are listed, but only abnormal results are displayed) Labs Reviewed  BASIC METABOLIC PANEL WITH GFR - Abnormal; Notable for the following components:      Result Value   Glucose, Bld 128 (*)    Calcium  8.7 (*)    All other components within normal limits  CBC - Abnormal; Notable for the following components:   HCT 35.7 (*)    All other components within normal limits  CBG MONITORING, ED - Abnormal; Notable for the following components:   Glucose-Capillary 134 (*)    All other components within normal limits  TROPONIN I (HIGH SENSITIVITY)  TROPONIN I (HIGH SENSITIVITY)    EKG: EKG Interpretation Date/Time:  Friday March 19 2024 12:24:09 EST Ventricular Rate:  56 PR Interval:  170 QRS Duration:  78 QT Interval:  432 QTC Calculation: 416 R Axis:   16  Text Interpretation: Sinus bradycardia Low voltage QRS Cannot rule out Anterior infarct , age undetermined Abnormal ECG When compared with ECG of 11-Jun-2021 14:23, PREVIOUS ECG IS PRESENT No significant change since last tracing Confirmed by Truth Barot [phone]) on 03/19/2024 4:55:08 PM  Radiology: [facility] Chest 1 View Result Date: 03/19/2024 EXAM: 1 VIEW(S) XRAY OF THE CHEST 03/19/2024 12:58:00 PM COMPARISON: None available. CLINICAL HISTORY: Arm pain FINDINGS: LUNGS AND PLEURA: No focal pulmonary opacity. No pleural effusion. No pneumothorax. HEART AND MEDIASTINUM: No acute  abnormality of the cardiac and mediastinal silhouettes. BONES AND SOFT TISSUES: No acute osseous abnormality. IMPRESSION: 1. No acute cardiopulmonary process. Electronically signed by: Waddell Calk MD 03/19/2024 01:42 PM EST RP Workstation: [id number]   CT Head Wo Contrast Result Date: 03/19/2024 EXAM: CT HEAD WITHOUT CONTRAST 03/19/2024 12:41:00 PM TECHNIQUE: CT of the head was performed without the administration of intravenous contrast. Automated exposure control, iterative reconstruction, and/or weight based adjustment of the mA/kV was utilized to reduce the radiation dose to as low as reasonably achievable. COMPARISON: None available. CLINICAL HISTORY: Syncope/presyncope, cerebrovascular cause suspected. FINDINGS: BRAIN AND VENTRICLES: No acute hemorrhage. No evidence of acute infarct. No hydrocephalus. No extra-axial collection. No mass effect or midline shift. White matter hypoattenuation particularly in the parietal periventricular white matter  most likely reflecting chronic microvascular ischemic changes. ORBITS: No acute abnormality. SINUSES: No acute abnormality. SOFT TISSUES AND SKULL: No acute soft tissue abnormality. No skull fracture. IMPRESSION: 1. No acute intracranial abnormality. 2. Hypoattenuation in the parietal periventricular white matter, most likely reflecting chronic microvascular ischemic changes. Electronically signed by: Donnice Mania MD 03/19/2024 12:49 PM EST RP Workstation: [id number]     Procedures   Medications Ordered in the ED - No data to display                                  Medical Decision Making  Workup here troponins x 2 very normal.  Both #4 chest x-ray negative blood sugar up a little bit at 128 otherwise electrolytes normal.  CBC normal.  Platelets normal.  CT head was done and that is no acute intercranial abnormality.  EKG without any acute findings.  Will have patient follow-up with cardiology and back with her primary care provider.  Primary  care could decide to do an outpatient MRI for the spells.  Patient's blood pressures are a little borderline here but they can trend that.  Final diagnoses:  Atypical chest pain  Primary hypertension    ED Discharge Orders     None          Geraldene Hamilton, MD 03/19/24 1715

## 2024-03-22 NOTE — Telephone Encounter (Signed)
Pt scheduled for HFU

## 2024-03-30 ENCOUNTER — Telehealth: Payer: Self-pay | Admitting: Family Medicine

## 2024-03-30 DIAGNOSIS — I10 Essential (primary) hypertension: Secondary | ICD-10-CM

## 2024-03-30 NOTE — Telephone Encounter (Unsigned)
 Copied from CRM [id number]. Topic: Clinical - Medication Refill >> Mar 30, 2024  2:45 PM Zebedee SAUNDERS wrote: Medication: spironolactone  (ALDACTONE ) 25 MG tablet  Has the patient contacted their pharmacy? Yes (Agent: If no, request that the patient contact the pharmacy for the refill. If patient does not wish to contact the pharmacy document the reason why and proceed with request.) (Agent: If yes, when and what did the pharmacy advise?)Pharmacy need script.   This is the patient's preferred pharmacy:  [facility] [address], [place] - [address] [place] [address] Phone: [phone] Fax: [phone]  Is this the correct pharmacy for this prescription? Yes If no, delete pharmacy and type the correct one.   Has the prescription been filled recently? Yes  Is the patient out of the medication? Yes  Has the patient been seen for an appointment in the last year OR does the patient have an upcoming appointment? Yes  Can we respond through MyChart? Yes  Agent: Please be advised that Rx refills may take up to 3 business days. We ask that you follow-up with your pharmacy.

## 2024-04-02 NOTE — Telephone Encounter (Signed)
 Not due for refill until 04/19/24

## 2024-04-05 ENCOUNTER — Encounter: Payer: Self-pay | Admitting: Family Medicine

## 2024-04-05 ENCOUNTER — Ambulatory Visit: Admitting: Family Medicine

## 2024-04-05 VITALS — BP 130/88 | HR 59 | Ht 62.0 in | Wt 189.2 lb

## 2024-04-05 DIAGNOSIS — R42 Dizziness and giddiness: Secondary | ICD-10-CM

## 2024-04-05 MED ORDER — NICOTINE POLACRILEX 4 MG MT GUM: 4.0000 mg | CHEWING_GUM | OROMUCOSAL | 0 refills | Status: AC | PRN

## 2024-04-05 MED ORDER — NICOTINE 14 MG/24HR TD PT24: 14.0000 mg | MEDICATED_PATCH | Freq: Every day | TRANSDERMAL | 2 refills | Status: AC

## 2024-04-05 NOTE — Progress Notes (Unsigned)
 Established Patient Office Visit  Subjective    Patient ID: [name], female    DOB: [birth year]  Age: 48 y.o. MRN: [id number]  CC:  Chief Complaint  Patient presents with   Hospitalization Follow-up    HPI [name] presents ***  Outpatient Encounter Medications as of 04/05/2024  Medication Sig   amLODipine  (NORVASC ) 10 MG tablet Take 1 tablet (10 mg total) by mouth daily.   carvedilol  (COREG ) 25 MG tablet Take 1 tablet (25 mg total) by mouth 2 (two) times daily.   losartan -hydrochlorothiazide  (HYZAAR ) 100-25 MG tablet Take 1 tablet by mouth daily.   spironolactone  (ALDACTONE ) 25 MG tablet Take 1 tablet (25 mg total) by mouth daily.   triamcinolone  ointment (KENALOG ) 0.5 % Apply 1 Application topically 2 (two) times daily.   albuterol  (VENTOLIN  HFA) 108 (90 Base) MCG/ACT inhaler Inhale 1-2 puffs into the lungs every 6 (six) hours as needed. (Patient not taking: Reported on 10/16/2023)   cetirizine  (ZYRTEC ) 10 MG tablet Take 1 tablet (10 mg total) by mouth daily. (Patient not taking: Reported on 01/19/2024)   fluticasone  (FLONASE ) 50 MCG/ACT nasal spray Place 2 sprays into both nostrils daily. (Patient not taking: Reported on 01/19/2024)   No facility-administered encounter medications on file as of 04/05/2024.    Past Medical History:  Diagnosis Date   Abscess of left axilla 12/03/2019   Presents with 4d of painful abscess under L axilla after shaving. No fever or chills. It has not drained. On exam, there is a small area of draining pus surrounded by 1cm area of fluctuance and erythema, surrounded by a larger indurated area. Not significantly tender. No tracking noted. Small amount of pus expressed.   Essential hypertension 04/27/2007   Qualifier: Diagnosis of  By: Jonelle MD, Erin     Hepatitis C    HFrEF (heart failure with reduced ejection fraction) (HCC)    Hypertension    Left breast mass 07/22/2019    Past Surgical History:  Procedure Laterality Date   LEFT  AND RIGHT HEART CATHETERIZATION WITH CORONARY ANGIOGRAM N/A 06/23/2013   Procedure: LEFT AND RIGHT HEART CATHETERIZATION WITH CORONARY ANGIOGRAM;  Surgeon: Salena GORMAN Negri, MD;  Location: [facility];  Service: Cardiovascular;  Laterality: N/A;   LIVER BIOPSY     LIVER BIOPSY      Family History  Problem Relation Age of Onset   Hypertension Mother    Hyperlipidemia Mother    Diabetes Mother    Hepatitis C Mother    Diabetes Maternal Aunt     Social History   Socioeconomic History   Marital status: Single    Spouse name: Not on file   Number of children: 6   Years of education: Not on file   Highest education level: 11th grade  Occupational History   Not on file  Tobacco Use   Smoking status: Some Days    Current packs/day: 0.50    Types: Cigarettes   Smokeless tobacco: Never   Tobacco comments:    6-7 per day ./  has Vaped   Vaping Use   Vaping status: Never Used  Substance and Sexual Activity   Alcohol use: Yes    Alcohol/week: 28.0 standard drinks of alcohol    Types: 28 Standard drinks or equivalent per week   Drug use: Yes    Frequency: 0.5 times per week    Types: Marijuana   Sexual activity: Not on file  Other Topics Concern   Not on file  Social History Narrative   Not on file   Social Drivers of Health   Financial Resource Strain: Medium Risk (10/16/2023)   Overall Financial Resource Strain (CARDIA)    Difficulty of Paying Living Expenses: Somewhat hard  Food Insecurity: Food Insecurity Present (10/16/2023)   Hunger Vital Sign    Worried About Running Out of Food in the Last Year: Sometimes true    Ran Out of Food in the Last Year: Sometimes true  Transportation Needs: No Transportation Needs (10/16/2023)   PRAPARE - Administrator, Civil Service (Medical): No    Lack of Transportation (Non-Medical): No  Physical Activity: Inactive (10/16/2023)   Exercise Vital Sign    Days of Exercise per Week: 5 days    Minutes of Exercise per Session: 0  min  Stress: No Stress Concern Present (10/16/2023)   [employer] of Occupational Health - Occupational Stress Questionnaire    Feeling of Stress: Not at all  Social Connections: Moderately Integrated (10/16/2023)   Social Connection and Isolation Panel    Frequency of Communication with Friends and Family: More than three times a week    Frequency of Social Gatherings with Friends and Family: Twice a week    Attends Religious Services: 1 to 4 times per year    Active Member of [employer] or Organizations: No    Attends Engineer, Structural: Not on file    Marital Status: Living with partner  Intimate Partner Violence: Not At Risk (05/13/2023)   Humiliation, Afraid, Rape, and Kick questionnaire    Fear of Current or Ex-Partner: No    Emotionally Abused: No    Physically Abused: No    Sexually Abused: No    ROS      Objective    BP 130/88   Pulse (!) 59   Ht 5' 2 (1.575 m)   Wt 189 lb 3.2 oz (85.8 kg)   LMP 03/12/2024 (Exact Date)   SpO2 97%   BMI 34.61 kg/m   Physical Exam  {Labs (Optional):23779}    Assessment & Plan:   There are no diagnoses linked to this encounter.   No follow-ups on file.   Tanda Raguel SQUIBB, MD

## 2024-04-07 ENCOUNTER — Encounter: Payer: Self-pay | Admitting: Family Medicine

## 2024-04-11 NOTE — Progress Notes (Unsigned)
 Cardiology Office Note:    Date:  04/13/2024   ID:  [name], DOB 08-Sep-1975, MRN [id number]  PCP:  Tanda Bleacher, MD   [facility] Providers Cardiologist:  Dub Huntsman, DO     Referring MD: Tanda Bleacher, MD   Chief Complaint  Patient presents with   New Patient (Initial Visit)    Dizziness, NICM    History of Present Illness:    [name] is a 48 y.o. female with a hx of HTN, hepatitis C, and systolic heart failure / NICM, tobacco abuse, with prior substance abuse.   She previously followed with Dr. Claudene for HFrEF.  Echo 2015 with LVEF 25-30%. She underwent R/L HC for concern for dilated cardiomyopathy which showed no CAD.  GDMT was titrated with ACEI, BB, hydrochlorothiazide , clonidine , hydrazine, and amlodipine .    Echo 07/2017 with LVEF 35-40%, mild to moderate AI, moderately decreased RV size. Echo in 2022 with preserved LVEF, moderate LVH.   She completed a heart monitor for dizziness 01/2024 that demonstrated SR with 4 PSVT but no sustained arrhythmias.   She was seen in the ER 03/19/24 with left arm pain / chest pain, ruled out with negative cardiac enzymes and nonischemic EKG. She was referred to establish with cardiology for further evaluation.   EKG today with anteroseptal Q waves but low voltage.   She recounts her ER visit for dizziness. She has had sudden dizziness episodes occurring once every 2-3 months. She describes lightheadedness and eye heaviness, which prompts her to move around and this resolves her symptoms. She denies full LOC.   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 She's on her feet constantly at work and notes mild LE edema. But otherwise no chest pain or SOB. She denies S/S of hypervolemia. She is trying to cut back on salt and sodium, does not eat fast foods.   No history of heart attack or stroke. She smokes cigarettes and smokes marijuana weekly. No cocaine/methamphetamine.   Family  history: Mother had CHF, deceased bc of CKD Father died from liver disease Siblings: no cardiac issues 6 children - ages 21-36 She lives at home with her BF    Past Medical History:  Diagnosis Date   Abscess of left axilla 12/03/2019   Presents with 4d of painful abscess under L axilla after shaving. No fever or chills. It has not drained. On exam, there is a small area of draining pus surrounded by 1cm area of fluctuance and erythema, surrounded by a larger indurated area. Not significantly tender. No tracking noted. Small amount of pus expressed.   Essential hypertension 04/27/2007   Qualifier: Diagnosis of  By: Jonelle MD, Erin     Hepatitis C    HFrEF (heart failure with reduced ejection fraction) (HCC)    Hypertension    Left breast mass 07/22/2019    Past Surgical History:  Procedure Laterality Date   LEFT AND RIGHT HEART CATHETERIZATION WITH CORONARY ANGIOGRAM N/A 06/23/2013   Procedure: LEFT AND RIGHT HEART CATHETERIZATION WITH CORONARY ANGIOGRAM;  Surgeon: Salena GORMAN Claudene, MD;  Location: [facility];  Service: Cardiovascular;  Laterality: N/A;   LIVER BIOPSY     LIVER BIOPSY      Current Medications: Active Medications[1]   Allergies:   Lisinopril    Social History   Socioeconomic History   Marital status: Single    Spouse name: Not on file   Number of children: 6   Years of education: Not on  file   Highest education level: 11th grade  Occupational History   Not on file  Tobacco Use   Smoking status: Some Days    Current packs/day: 0.50    Types: Cigarettes   Smokeless tobacco: Never   Tobacco comments:    6-7 per day ./  has Vaped   Vaping Use   Vaping status: Never Used  Substance and Sexual Activity   Alcohol use: Yes    Alcohol/week: 28.0 standard drinks of alcohol    Types: 28 Standard drinks or equivalent per week   Drug use: Yes    Frequency: 0.5 times per week    Types: Marijuana   Sexual activity: Not on file  Other Topics Concern   Not on file   Social History Narrative   Not on file   Social Drivers of Health   Tobacco Use: High Risk (04/13/2024)   Patient History    Smoking Tobacco Use: Some Days    Smokeless Tobacco Use: Never    Passive Exposure: Not on file  Financial Resource Strain: Medium Risk (10/16/2023)   Overall Financial Resource Strain (CARDIA)    Difficulty of Paying Living Expenses: Somewhat hard  Food Insecurity: Food Insecurity Present (10/16/2023)   Epic    Worried About Programme Researcher, Broadcasting/film/video in the Last Year: Sometimes true    [employer] of Food in the Last Year: Sometimes true  Transportation Needs: No Transportation Needs (10/16/2023)   Epic    Lack of Transportation (Medical): No    Lack of Transportation (Non-Medical): No  Physical Activity: Inactive (10/16/2023)   Exercise Vital Sign    Days of Exercise per Week: 5 days    Minutes of Exercise per Session: 0 min  Stress: No Stress Concern Present (10/16/2023)   [employer] of Occupational Health - Occupational Stress Questionnaire    Feeling of Stress: Not at all  Social Connections: Moderately Integrated (10/16/2023)   Social Connection and Isolation Panel    Frequency of Communication with Friends and Family: More than three times a week    Frequency of Social Gatherings with Friends and Family: Twice a week    Attends Religious Services: 1 to 4 times per year    Active Member of [employer] or Organizations: No    Attends Engineer, Structural: Not on file    Marital Status: Living with partner  Depression (PHQ2-9): Low Risk (05/13/2023)   Depression (PHQ2-9)    PHQ-2 Score: 0  Alcohol Screen: Medium Risk (10/16/2023)   Alcohol Screen    Last Alcohol Screening Score (AUDIT): 10  Housing: Low Risk (10/16/2023)   Epic    Unable to Pay for Housing in the Last Year: No    Number of Times Moved in the Last Year: 0    Homeless in the Last Year: No  Utilities: Not At Risk (01/28/2023)   AHC Utilities    Threatened with loss of utilities: No   Health Literacy: Adequate Health Literacy (01/28/2023)   B1300 Health Literacy    Frequency of need for help with medical instructions: Never     Family History: The patient's family history includes Diabetes in her maternal aunt and mother; Hepatitis C in her mother; Hyperlipidemia in her mother; Hypertension in her mother.  ROS:   Please see the history of present illness.     All other systems reviewed and are negative.  EKGs/Labs/Other Studies Reviewed:    The following studies were reviewed today:  EKG Interpretation Date/Time:  Tuesday April 13 2024 10:05:03 EST Ventricular Rate:  60 PR Interval:  166 QRS Duration:  80 QT Interval:  422 QTC Calculation: 422 R Axis:   -19  Text Interpretation: Normal sinus rhythm Anterolateral infarct (cited on or before 18-Aug-2017) When compared with ECG of 19-Mar-2024 12:24, No significant change was found Confirmed by Madie Slough ([id number]) on 04/13/2024 10:11:55 AM    Recent Labs: 07/08/2023: ALT 11; TSH 0.884 03/19/2024: BUN 10; Creatinine, Ser 0.74; Hemoglobin 12.1; Platelets 264; Potassium 3.6; Sodium 138  Recent Lipid Panel    Component Value Date/Time   CHOL 165 07/08/2023 1028   TRIG 166 (H) 07/08/2023 1028   HDL 52 07/08/2023 1028   CHOLHDL 3.2 07/08/2023 1028   CHOLHDL 2.3 Ratio 10/20/2009 2054   VLDL 13 10/20/2009 2054   LDLCALC 85 07/08/2023 1028     Risk Assessment/Calculations:                Physical Exam:    VS:  BP 112/88   Pulse 60   Ht 5' 2 (1.575 m)   Wt 189 lb 6.4 oz (85.9 kg)   LMP 03/12/2024 (Exact Date)   SpO2 98%   BMI 34.64 kg/m     Wt Readings from Last 3 Encounters:  04/13/24 189 lb 6.4 oz (85.9 kg)  04/05/24 189 lb 3.2 oz (85.8 kg)  03/19/24 188 lb (85.3 kg)     GEN:  Well nourished, well developed in no acute distress HEENT: Normal NECK: No JVD; No carotid bruits LYMPHATICS: No lymphadenopathy CARDIAC: RRR, no murmurs, rubs, gallops RESPIRATORY:  Clear to auscultation  without rales, wheezing or rhonchi  ABDOMEN: Soft, non-tender, non-distended MUSCULOSKELETAL:  minimal B LE edema; No deformity  SKIN: Warm and dry NEUROLOGIC:  Alert and oriented x 3 PSYCHIATRIC:  Normal affect   ASSESSMENT:    1. Chronic systolic heart failure (HCC)   2. NICM (nonischemic cardiomyopathy) (HCC)   3. Hx of hepatitis C   4. LVH (left ventricular hypertrophy)   5. Dizziness   6. Tobacco use disorder   7. Prediabetes    PLAN:    In order of problems listed above:  Dizziness - I recommend echo to begin evaluating dizziness and current heart function - does not appear volume    LVH - no sustained arrhythmias on recent echo, but hx of NICM, dizziness, and low voltage on EKG - will obtain echo as above, but I am wondering if she will eventually need cMRI to look for infiltrative disease   Chronic systolic heart failure Hx of NICM - heart cath in 2015 with no obstructive disease - GDMT: losartan -hydrochlorothiazide  100-25 mg, 25 mg coreg  BID, 25 mg spironolactone  daily, 10 mg amlodipine  - last echo 2022 with preserved LVEF, but LVH - repeating an echo as above - I think depending on echo, we can titrate her GDMT to include entresto and likely stop amlodipine  given LE swelling after workin 10 hours   Tobacco abuse - currently smoking about 1/4 cigarettes per day - trying to quit, has patches   Hepatitis C - previously followed with infectious disease, no longer on medication because it cleared - will refer back to ID - hasn't been seen in ?15 years   Prediabetes - A1c 6.0% - will increase walking after meals   She prefers to follow with Dr. Sheena. Will schedule in 6 weeks.           Medication Adjustments/Labs and Tests Ordered: Current medicines are reviewed at length  with the patient today.  Concerns regarding medicines are outlined above.  Orders Placed This Encounter  Procedures   Ambulatory referral to Infectious Disease   EKG 12-Lead    ECHOCARDIOGRAM COMPLETE   No orders of the defined types were placed in this encounter.   Patient Instructions  Medication Instructions:  Your physician recommends that you continue on your current medications as directed. Please refer to the Current Medication list given to you today.  *If you need a refill on your cardiac medications before your next appointment, please call your pharmacy*  Lab Work: None ordered  If you have labs (blood work) drawn today and your tests are completely normal, you will receive your results only by: MyChart Message (if you have MyChart) OR A paper copy in the mail If you have any lab test that is abnormal or we need to change your treatment, we will call you to review the results.  Testing/Procedures: Your physician has requested that you have an echocardiogram. Echocardiography is a painless test that uses sound waves to create images of your heart. It provides your doctor with information about the size and shape of your heart and how well your hearts chambers and valves are working. This procedure takes approximately one hour. There are no restrictions for this procedure. Please do NOT wear cologne, perfume, aftershave, or lotions (deodorant is allowed). Please arrive 15 minutes prior to your appointment time.  Please note: We ask at that you not bring children with you during ultrasound (echo/ vascular) testing. Due to room size and safety concerns, children are not allowed in the ultrasound rooms during exams. Our front office staff cannot provide observation of children in our lobby area while testing is being conducted. An adult accompanying a patient to their appointment will only be allowed in the ultrasound room at the discretion of the ultrasound technician under special circumstances. We apologize for any inconvenience.   You have been referred to INFECTIOUS DISEASE.  THEY WILL CALL YOU TO SCHEDULE.  Follow-Up: At [facility],  you and your health needs are our priority.  As part of our continuing mission to provide you with exceptional heart care, our providers are all part of one team.  This team includes your primary Cardiologist (physician) and Advanced Practice Providers or APPs (Physician Assistants and Nurse Practitioners) who all work together to provide you with the care you need, when you need it.  Your next appointment:   6 week(s)  Provider:   Kardie Tobb, DO    We recommend signing up for the patient portal called MyChart.  Sign up information is provided on this After Visit Summary.  MyChart is used to connect with patients for Virtual Visits (Telemedicine).  Patients are able to view lab/test results, encounter notes, upcoming appointments, etc.  Non-urgent messages can be sent to your provider as well.   To learn more about what you can do with MyChart, go to [email or web address].   Other Instructions          Signed, Jon Nat Hails, [place]  04/13/2024 10:53 AM    [facility]     [1]  Current Meds  Medication Sig   amLODipine  (NORVASC ) 10 MG tablet Take 1 tablet (10 mg total) by mouth daily.   carvedilol  (COREG ) 25 MG tablet Take 1 tablet (25 mg total) by mouth 2 (two) times daily.   cyanocobalamin (VITAMIN B12) 1000 MCG tablet Take 2,000 mcg by mouth daily.   losartan -hydrochlorothiazide  (HYZAAR ) 100-25 MG  tablet Take 1 tablet by mouth daily.   spironolactone  (ALDACTONE ) 25 MG tablet Take 1 tablet (25 mg total) by mouth daily.   triamcinolone  ointment (KENALOG ) 0.5 % Apply 1 Application topically 2 (two) times daily.

## 2024-04-13 ENCOUNTER — Encounter: Payer: Self-pay | Admitting: Physician Assistant

## 2024-04-13 ENCOUNTER — Ambulatory Visit: Attending: Physician Assistant | Admitting: Physician Assistant

## 2024-04-13 VITALS — BP 112/88 | HR 60 | Ht 62.0 in | Wt 189.4 lb

## 2024-04-13 DIAGNOSIS — Z8619 Personal history of other infectious and parasitic diseases: Secondary | ICD-10-CM

## 2024-04-13 DIAGNOSIS — I428 Other cardiomyopathies: Secondary | ICD-10-CM

## 2024-04-13 DIAGNOSIS — R42 Dizziness and giddiness: Secondary | ICD-10-CM

## 2024-04-13 DIAGNOSIS — R7303 Prediabetes: Secondary | ICD-10-CM

## 2024-04-13 DIAGNOSIS — I5022 Chronic systolic (congestive) heart failure: Secondary | ICD-10-CM

## 2024-04-13 DIAGNOSIS — F17200 Nicotine dependence, unspecified, uncomplicated: Secondary | ICD-10-CM

## 2024-04-13 DIAGNOSIS — I517 Cardiomegaly: Secondary | ICD-10-CM

## 2024-04-13 NOTE — Patient Instructions (Addendum)
 Medication Instructions:  Your physician recommends that you continue on your current medications as directed. Please refer to the Current Medication list given to you today.  *If you need a refill on your cardiac medications before your next appointment, please call your pharmacy*  Lab Work: None ordered  If you have labs (blood work) drawn today and your tests are completely normal, you will receive your results only by: MyChart Message (if you have MyChart) OR A paper copy in the mail If you have any lab test that is abnormal or we need to change your treatment, we will call you to review the results.  Testing/Procedures: Your physician has requested that you have an echocardiogram. Echocardiography is a painless test that uses sound waves to create images of your heart. It provides your doctor with information about the size and shape of your heart and how well your hearts chambers and valves are working. This procedure takes approximately one hour. There are no restrictions for this procedure. Please do NOT wear cologne, perfume, aftershave, or lotions (deodorant is allowed). Please arrive 15 minutes prior to your appointment time.  Please note: We ask at that you not bring children with you during ultrasound (echo/ vascular) testing. Due to room size and safety concerns, children are not allowed in the ultrasound rooms during exams. Our front office staff cannot provide observation of children in our lobby area while testing is being conducted. An adult accompanying a patient to their appointment will only be allowed in the ultrasound room at the discretion of the ultrasound technician under special circumstances. We apologize for any inconvenience.   You have been referred to INFECTIOUS DISEASE.  THEY WILL CALL YOU TO SCHEDULE.  Follow-Up: At [facility], you and your health needs are our priority.  As part of our continuing mission to provide you with exceptional heart  care, our providers are all part of one team.  This team includes your primary Cardiologist (physician) and Advanced Practice Providers or APPs (Physician Assistants and Nurse Practitioners) who all work together to provide you with the care you need, when you need it.  Your next appointment:   6 week(s)  Provider:   Kardie Tobb, DO    We recommend signing up for the patient portal called MyChart.  Sign up information is provided on this After Visit Summary.  MyChart is used to connect with patients for Virtual Visits (Telemedicine).  Patients are able to view lab/test results, encounter notes, upcoming appointments, etc.  Non-urgent messages can be sent to your provider as well.   To learn more about what you can do with MyChart, go to [email or web address].   Other Instructions

## 2024-04-20 ENCOUNTER — Ambulatory Visit: Admitting: Family Medicine

## 2024-04-26 ENCOUNTER — Encounter: Payer: Self-pay | Admitting: Family Medicine

## 2024-05-16 ENCOUNTER — Telehealth: Admitting: Family

## 2024-05-16 DIAGNOSIS — W57XXXA Bitten or stung by nonvenomous insect and other nonvenomous arthropods, initial encounter: Secondary | ICD-10-CM | POA: Diagnosis not present

## 2024-05-16 DIAGNOSIS — S40861A Insect bite (nonvenomous) of right upper arm, initial encounter: Secondary | ICD-10-CM | POA: Diagnosis not present

## 2024-05-16 MED ORDER — PREDNISONE 20 MG PO TABS: 40.0000 mg | ORAL_TABLET | Freq: Every day | ORAL | 0 refills | Status: AC

## 2024-05-16 NOTE — Progress Notes (Signed)
 E Visit for Insect Sting  Thank you for describing the insect sting for us .  Here is how we plan to help!  An uncomplicated insect sting that just occurred and can be closely followed using the instructions in your care plan.  The 2 greatest risks from insect stings are allergic reaction, which can be fatal in some people and infection, which is more common and less serious.  Bees, wasps, yellow jackets, and hornets belong to a class of insects called Hymenoptera.  Most insect stings cause only minor discomfort.  Stings can happen anywhere on the body and can be painful.  Most stings are from honey bees or yellow jackets.  Fire ants can sting multiple times.  The sites of the stings are more likely to become infected.    Provided a home care guide for insect stings and instructions on when to call for help. and I have sent in prednisone  40 mg by mouth daily for 5 days to the pharmacy you selected.  Please make sure that you selected a pharmacy that is open now.  What can be used to prevent Insect Stings?  Insect repellant with at least 20% DEET.  Wearing long pants and shirts with socks and shoes.  Wear dark or drab-colored clothes rather than bright colors.  Avoid using perfumes and hair sprays; these attract insects.  HOME CARE ADVICE:  1. Stinger removal: The stinger looks like a tiny black dot in the sting. Use a fingernail, credit card edge, or knife-edge to scrape it off.  Don't pull it out because it squeezes out more venom. If the stinger is below the skin surface, leave it alone.  It will be shed with normal skin healing. 2. Use cold compresses to the area of the sting for 10-20 minutes.  You may repeat this as needed to relieve symptoms of pain and swelling. 3.  For pain relief, take acetominophen 650 mg 4-6 hours as needed or ibuprofen  400 mg every 6-8 hours as needed or naproxen 250-500 mg every 12 hours as needed. 4.  You can also use hydrocortisone  cream 0.5% or 1% up to 4  times daily as needed for itching. 5.  If the sting becomes very itchy, take Benadryl 25-50 mg, follow directions on box. 6.  Wash the area 2-3 times daily with antibacterial soap and warm water. 7. Call your Doctor if: Fever, a severe headache, or rash occur in the next 2 weeks. Sting area begins to look infected. Redness and swelling worsens after home treatment. Your current symptoms become worse.    MAKE SURE YOU:  Understand these instructions. Will watch your condition. Will get help right away if you are not doing well or get worse.  Thank you for choosing an e-visit. Your e-visit answers were reviewed by a board certified advanced clinical practitioner to complete your personal care plan. Depending upon the condition, your plan could have included both over the counter or prescription medications. Please review your pharmacy choice. Be sure that the pharmacy you have chosen is open so that you can pick up your prescription now.  If there is a problem you may message your provider in MyChart to have the prescription routed to another pharmacy. Your safety is important to us . If you have drug allergies check your prescription carefully.  For the next 24 hours, you can use MyChart to ask questions about todays visit, request a non-urgent call back, or ask for a work or school excuse from your e-visit provider.  You will get an email in the next two days asking about your experience. I hope that your e-visit has been valuable and will speed your recovery.   I have spent 5 minutes in review of e-visit questionnaire, review and updating patient chart, medical decision making and response to patient.   Bari Learn, FNP

## 2024-05-17 ENCOUNTER — Telehealth: Admitting: Physician Assistant

## 2024-05-17 DIAGNOSIS — W57XXXA Bitten or stung by nonvenomous insect and other nonvenomous arthropods, initial encounter: Secondary | ICD-10-CM

## 2024-05-17 MED ORDER — TRIAMCINOLONE ACETONIDE 0.5 % EX OINT: 1.0000 | TOPICAL_OINTMENT | Freq: Two times a day (BID) | CUTANEOUS | 0 refills | Status: AC

## 2024-05-17 MED ORDER — HYDROXYZINE PAMOATE 25 MG PO CAPS: 25.0000 mg | ORAL_CAPSULE | Freq: Three times a day (TID) | ORAL | 0 refills | Status: AC | PRN

## 2024-05-17 MED ORDER — CETIRIZINE HCL 10 MG PO TABS: 10.0000 mg | ORAL_TABLET | Freq: Every day | ORAL | 0 refills | Status: AC

## 2024-05-17 NOTE — Progress Notes (Signed)
 E Visit for Insect Sting  Thank you for describing the insect sting for us .  Here is how we plan to help!  An uncomplicated insect sting that just occurred and can be closely followed using the instructions in your care plan.  The 2 greatest risks from insect stings are allergic reaction, which can be fatal in some people and infection, which is more common and less serious.  Bees, wasps, yellow jackets, and hornets belong to a class of insects called Hymenoptera.  Most insect stings cause only minor discomfort.  Stings can happen anywhere on the body and can be painful.  Most stings are from honey bees or yellow jackets.  Fire ants can sting multiple times.  The sites of the stings are more likely to become infected.    Based on your information I have: and Provided a home care guide for insect stings and instructions on when to call for help. I have also provided a stronger topical steroid and a prescription strength antihistamine.   What can be used to prevent Insect Stings?  Insect repellant with at least 20% DEET.  Wearing long pants and shirts with socks and shoes.  Wear dark or drab-colored clothes rather than bright colors.  Avoid using perfumes and hair sprays; these attract insects.  HOME CARE ADVICE:  1. Stinger removal: The stinger looks like a tiny black dot in the sting. Use a fingernail, credit card edge, or knife-edge to scrape it off.  Don't pull it out because it squeezes out more venom. If the stinger is below the skin surface, leave it alone.  It will be shed with normal skin healing. 2. Use cold compresses to the area of the sting for 10-20 minutes.  You may repeat this as needed to relieve symptoms of pain and swelling. 3.  For pain relief, take acetominophen 650 mg 4-6 hours as needed or ibuprofen  400 mg every 6-8 hours as needed or naproxen 250-500 mg every 12 hours as needed. 4.  You can also use hydrocortisone  cream 0.5% or 1% up to 4 times daily as needed for  itching. 5.  If the sting becomes very itchy, take Benadryl 25-50 mg, follow directions on box. 6.  Wash the area 2-3 times daily with antibacterial soap and warm water. 7. Call your Doctor if: Fever, a severe headache, or rash occur in the next 2 weeks. Sting area begins to look infected. Redness and swelling worsens after home treatment. Your current symptoms become worse.    MAKE SURE YOU:  Understand these instructions. Will watch your condition. Will get help right away if you are not doing well or get worse.  Thank you for choosing an e-visit. Your e-visit answers were reviewed by a board certified advanced clinical practitioner to complete your personal care plan. Depending upon the condition, your plan could have included both over the counter or prescription medications. Please review your pharmacy choice. Be sure that the pharmacy you have chosen is open so that you can pick up your prescription now.  If there is a problem you may message your provider in MyChart to have the prescription routed to another pharmacy. Your safety is important to us . If you have drug allergies check your prescription carefully.  For the next 24 hours, you can use MyChart to ask questions about todays visit, request a non-urgent call back, or ask for a work or school excuse from your e-visit provider. You will get an email in the next two days asking  about your experience. I hope that your e-visit has been valuable and will speed your recovery.   I have spent 5 minutes in review of e-visit questionnaire, review and updating patient chart, medical decision making and response to patient.   Teena Shuck, PA-C

## 2024-05-25 ENCOUNTER — Ambulatory Visit (HOSPITAL_COMMUNITY)

## 2024-05-31 ENCOUNTER — Encounter: Payer: Self-pay | Admitting: *Deleted

## 2024-06-01 ENCOUNTER — Ambulatory Visit: Admitting: Cardiology

## 2024-06-30 ENCOUNTER — Ambulatory Visit (HOSPITAL_COMMUNITY)

## 2024-07-05 ENCOUNTER — Ambulatory Visit: Admitting: Family Medicine

## 2024-08-26 ENCOUNTER — Ambulatory Visit: Admitting: Cardiology
